# Patient Record
Sex: Female | Born: 1975
Health system: Southern US, Community
[De-identification: ages and names within clinical notes are randomized; demographics above are authoritative.]

## PROBLEM LIST (undated history)

## (undated) DIAGNOSIS — D509 Iron deficiency anemia, unspecified: Principal | ICD-10-CM

## (undated) DIAGNOSIS — D473 Essential (hemorrhagic) thrombocythemia: Secondary | ICD-10-CM

## (undated) DIAGNOSIS — D72829 Elevated white blood cell count, unspecified: Secondary | ICD-10-CM

## (undated) DIAGNOSIS — T7840XA Allergy, unspecified, initial encounter: Secondary | ICD-10-CM

## (undated) DIAGNOSIS — Z923 Personal history of irradiation: Secondary | ICD-10-CM

## (undated) DIAGNOSIS — E119 Type 2 diabetes mellitus without complications: Secondary | ICD-10-CM

## (undated) DIAGNOSIS — T783XXA Angioneurotic edema, initial encounter: Secondary | ICD-10-CM

## (undated) HISTORY — DX: Elevated white blood cell count, unspecified: D72.829

## (undated) HISTORY — PX: WRIST SURGERY: SHX841

## (undated) HISTORY — DX: Angioneurotic edema, initial encounter: T78.3XXA

## (undated) HISTORY — DX: Iron deficiency anemia, unspecified: D50.9

## (undated) HISTORY — PX: CHOLECYSTECTOMY: SHX55

## (undated) HISTORY — PX: APPENDECTOMY: SHX54

## (undated) HISTORY — DX: Allergy, unspecified, initial encounter: T78.40XA

## (undated) HISTORY — DX: Essential (hemorrhagic) thrombocythemia: D47.3

---

## 1997-09-10 ENCOUNTER — Other Ambulatory Visit: Admission: RE | Admit: 1997-09-10 | Discharge: 1997-09-10 | Payer: Self-pay | Admitting: Obstetrics and Gynecology

## 1998-11-12 ENCOUNTER — Other Ambulatory Visit: Admission: RE | Admit: 1998-11-12 | Discharge: 1998-11-12 | Payer: Self-pay | Admitting: Obstetrics and Gynecology

## 1999-01-07 ENCOUNTER — Encounter: Payer: Self-pay | Admitting: Obstetrics and Gynecology

## 1999-01-07 ENCOUNTER — Ambulatory Visit (HOSPITAL_COMMUNITY): Admission: RE | Admit: 1999-01-07 | Discharge: 1999-01-07 | Payer: Self-pay | Admitting: Obstetrics and Gynecology

## 1999-02-05 ENCOUNTER — Encounter: Payer: Self-pay | Admitting: Obstetrics and Gynecology

## 1999-02-05 ENCOUNTER — Ambulatory Visit (HOSPITAL_COMMUNITY): Admission: RE | Admit: 1999-02-05 | Discharge: 1999-02-05 | Payer: Self-pay | Admitting: Obstetrics and Gynecology

## 1999-03-19 ENCOUNTER — Encounter (HOSPITAL_COMMUNITY): Admission: RE | Admit: 1999-03-19 | Discharge: 1999-06-09 | Payer: Self-pay | Admitting: Obstetrics and Gynecology

## 1999-04-03 ENCOUNTER — Ambulatory Visit (HOSPITAL_COMMUNITY): Admission: RE | Admit: 1999-04-03 | Discharge: 1999-04-03 | Payer: Self-pay | Admitting: Obstetrics and Gynecology

## 1999-06-06 ENCOUNTER — Encounter: Payer: Self-pay | Admitting: Obstetrics and Gynecology

## 1999-06-06 ENCOUNTER — Encounter (HOSPITAL_COMMUNITY): Admission: RE | Admit: 1999-06-06 | Discharge: 1999-06-12 | Payer: Self-pay | Admitting: Obstetrics and Gynecology

## 1999-06-10 ENCOUNTER — Encounter: Payer: Self-pay | Admitting: Obstetrics and Gynecology

## 1999-06-10 ENCOUNTER — Inpatient Hospital Stay (HOSPITAL_COMMUNITY): Admission: AD | Admit: 1999-06-10 | Discharge: 1999-06-13 | Payer: Self-pay | Admitting: Obstetrics and Gynecology

## 1999-07-11 ENCOUNTER — Encounter: Admission: RE | Admit: 1999-07-11 | Discharge: 1999-07-29 | Payer: Self-pay | Admitting: Obstetrics and Gynecology

## 1999-08-11 ENCOUNTER — Encounter (HOSPITAL_COMMUNITY): Admission: RE | Admit: 1999-08-11 | Discharge: 1999-11-09 | Payer: Self-pay | Admitting: Obstetrics and Gynecology

## 1999-08-15 ENCOUNTER — Observation Stay (HOSPITAL_COMMUNITY): Admission: EM | Admit: 1999-08-15 | Discharge: 1999-08-16 | Payer: Self-pay | Admitting: Emergency Medicine

## 1999-08-15 ENCOUNTER — Encounter: Payer: Self-pay | Admitting: Emergency Medicine

## 1999-08-15 ENCOUNTER — Encounter: Payer: Self-pay | Admitting: Surgery

## 1999-08-21 ENCOUNTER — Ambulatory Visit (HOSPITAL_COMMUNITY): Admission: AD | Admit: 1999-08-21 | Discharge: 1999-08-21 | Payer: Self-pay | Admitting: Surgery

## 2000-01-09 ENCOUNTER — Other Ambulatory Visit: Admission: RE | Admit: 2000-01-09 | Discharge: 2000-01-09 | Payer: Self-pay | Admitting: Obstetrics and Gynecology

## 2001-01-10 ENCOUNTER — Other Ambulatory Visit: Admission: RE | Admit: 2001-01-10 | Discharge: 2001-01-10 | Payer: Self-pay | Admitting: Obstetrics and Gynecology

## 2001-03-21 ENCOUNTER — Ambulatory Visit (HOSPITAL_COMMUNITY): Admission: RE | Admit: 2001-03-21 | Discharge: 2001-03-21 | Payer: Self-pay | Admitting: Family Medicine

## 2001-03-21 ENCOUNTER — Encounter: Payer: Self-pay | Admitting: Family Medicine

## 2001-09-20 ENCOUNTER — Encounter: Payer: Self-pay | Admitting: Emergency Medicine

## 2001-09-20 ENCOUNTER — Inpatient Hospital Stay (HOSPITAL_COMMUNITY): Admission: EM | Admit: 2001-09-20 | Discharge: 2001-09-22 | Payer: Self-pay | Admitting: Emergency Medicine

## 2001-09-21 ENCOUNTER — Encounter: Payer: Self-pay | Admitting: Orthopedic Surgery

## 2001-11-03 ENCOUNTER — Ambulatory Visit (HOSPITAL_COMMUNITY): Admission: RE | Admit: 2001-11-03 | Discharge: 2001-11-03 | Payer: Self-pay | Admitting: Orthopedic Surgery

## 2001-11-14 ENCOUNTER — Encounter: Admission: RE | Admit: 2001-11-14 | Discharge: 2001-12-13 | Payer: Self-pay | Admitting: Orthopedic Surgery

## 2002-04-19 ENCOUNTER — Other Ambulatory Visit: Admission: RE | Admit: 2002-04-19 | Discharge: 2002-04-19 | Payer: Self-pay | Admitting: Obstetrics and Gynecology

## 2003-06-05 ENCOUNTER — Other Ambulatory Visit: Admission: RE | Admit: 2003-06-05 | Discharge: 2003-06-05 | Payer: Self-pay | Admitting: Obstetrics and Gynecology

## 2004-06-05 ENCOUNTER — Other Ambulatory Visit: Admission: RE | Admit: 2004-06-05 | Discharge: 2004-06-05 | Payer: Self-pay | Admitting: Obstetrics and Gynecology

## 2005-06-08 ENCOUNTER — Other Ambulatory Visit: Admission: RE | Admit: 2005-06-08 | Discharge: 2005-06-08 | Payer: Self-pay | Admitting: Obstetrics and Gynecology

## 2012-05-23 ENCOUNTER — Other Ambulatory Visit: Payer: Self-pay | Admitting: Nurse Practitioner

## 2012-05-23 ENCOUNTER — Ambulatory Visit (HOSPITAL_COMMUNITY)
Admission: RE | Admit: 2012-05-23 | Discharge: 2012-05-23 | Disposition: A | Discharge status: Home / Self Care | Payer: 59 | Source: Ambulatory Visit | Attending: Nurse Practitioner | Admitting: Nurse Practitioner

## 2012-05-23 DIAGNOSIS — R0600 Dyspnea, unspecified: Secondary | ICD-10-CM

## 2012-05-23 DIAGNOSIS — R0609 Other forms of dyspnea: Secondary | ICD-10-CM | POA: Insufficient documentation

## 2012-05-23 DIAGNOSIS — R0989 Other specified symptoms and signs involving the circulatory and respiratory systems: Secondary | ICD-10-CM | POA: Insufficient documentation

## 2012-05-23 DIAGNOSIS — R059 Cough, unspecified: Secondary | ICD-10-CM | POA: Insufficient documentation

## 2012-05-23 IMAGING — CR DG CHEST 2V
2 series · 2 of 2 positions shown · non-contrast
Comparison: None.

CLINICAL DATA: Dyspnea for 2 weeks.  Cough.

CHEST - 2 VIEW

[w chest pa]
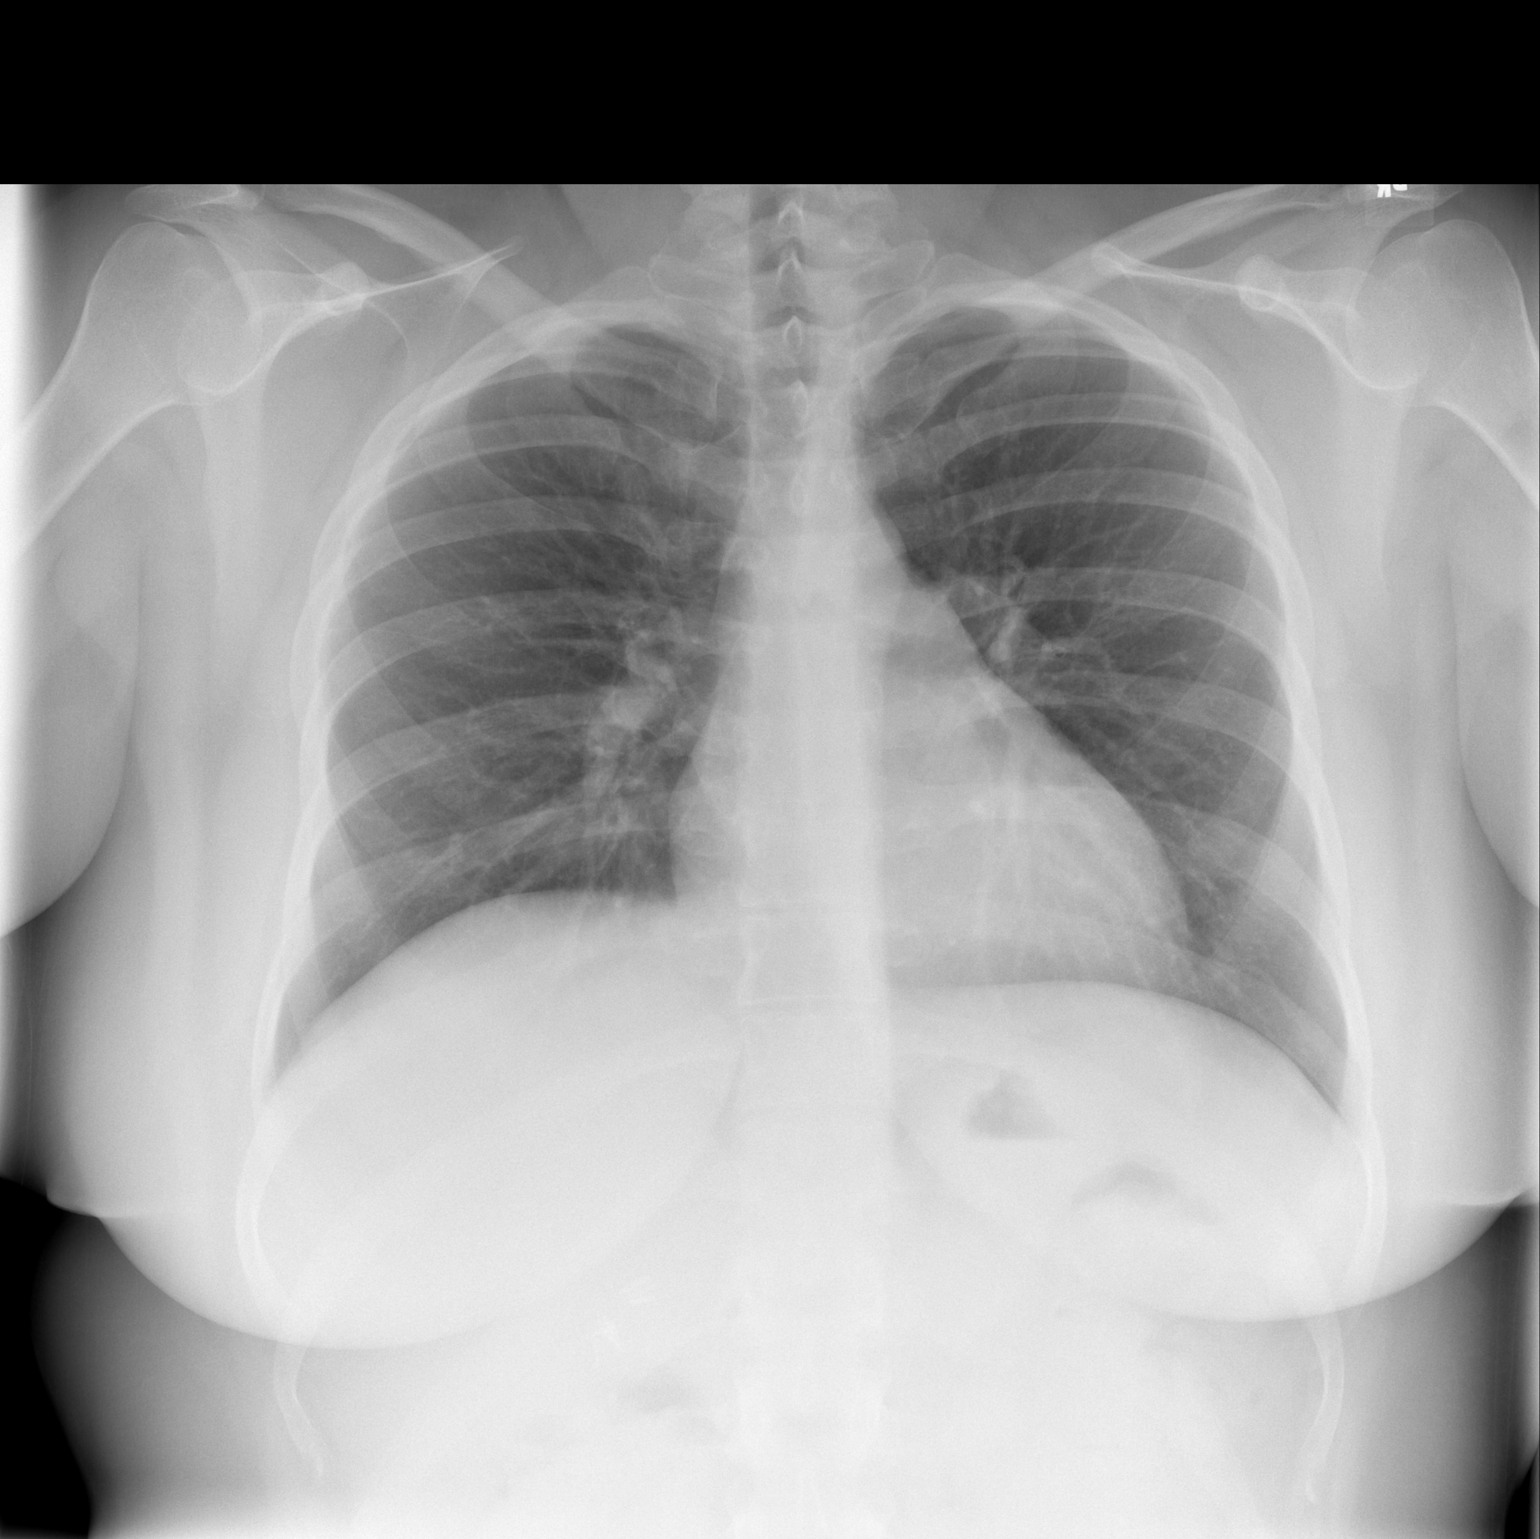

[w chest lat]
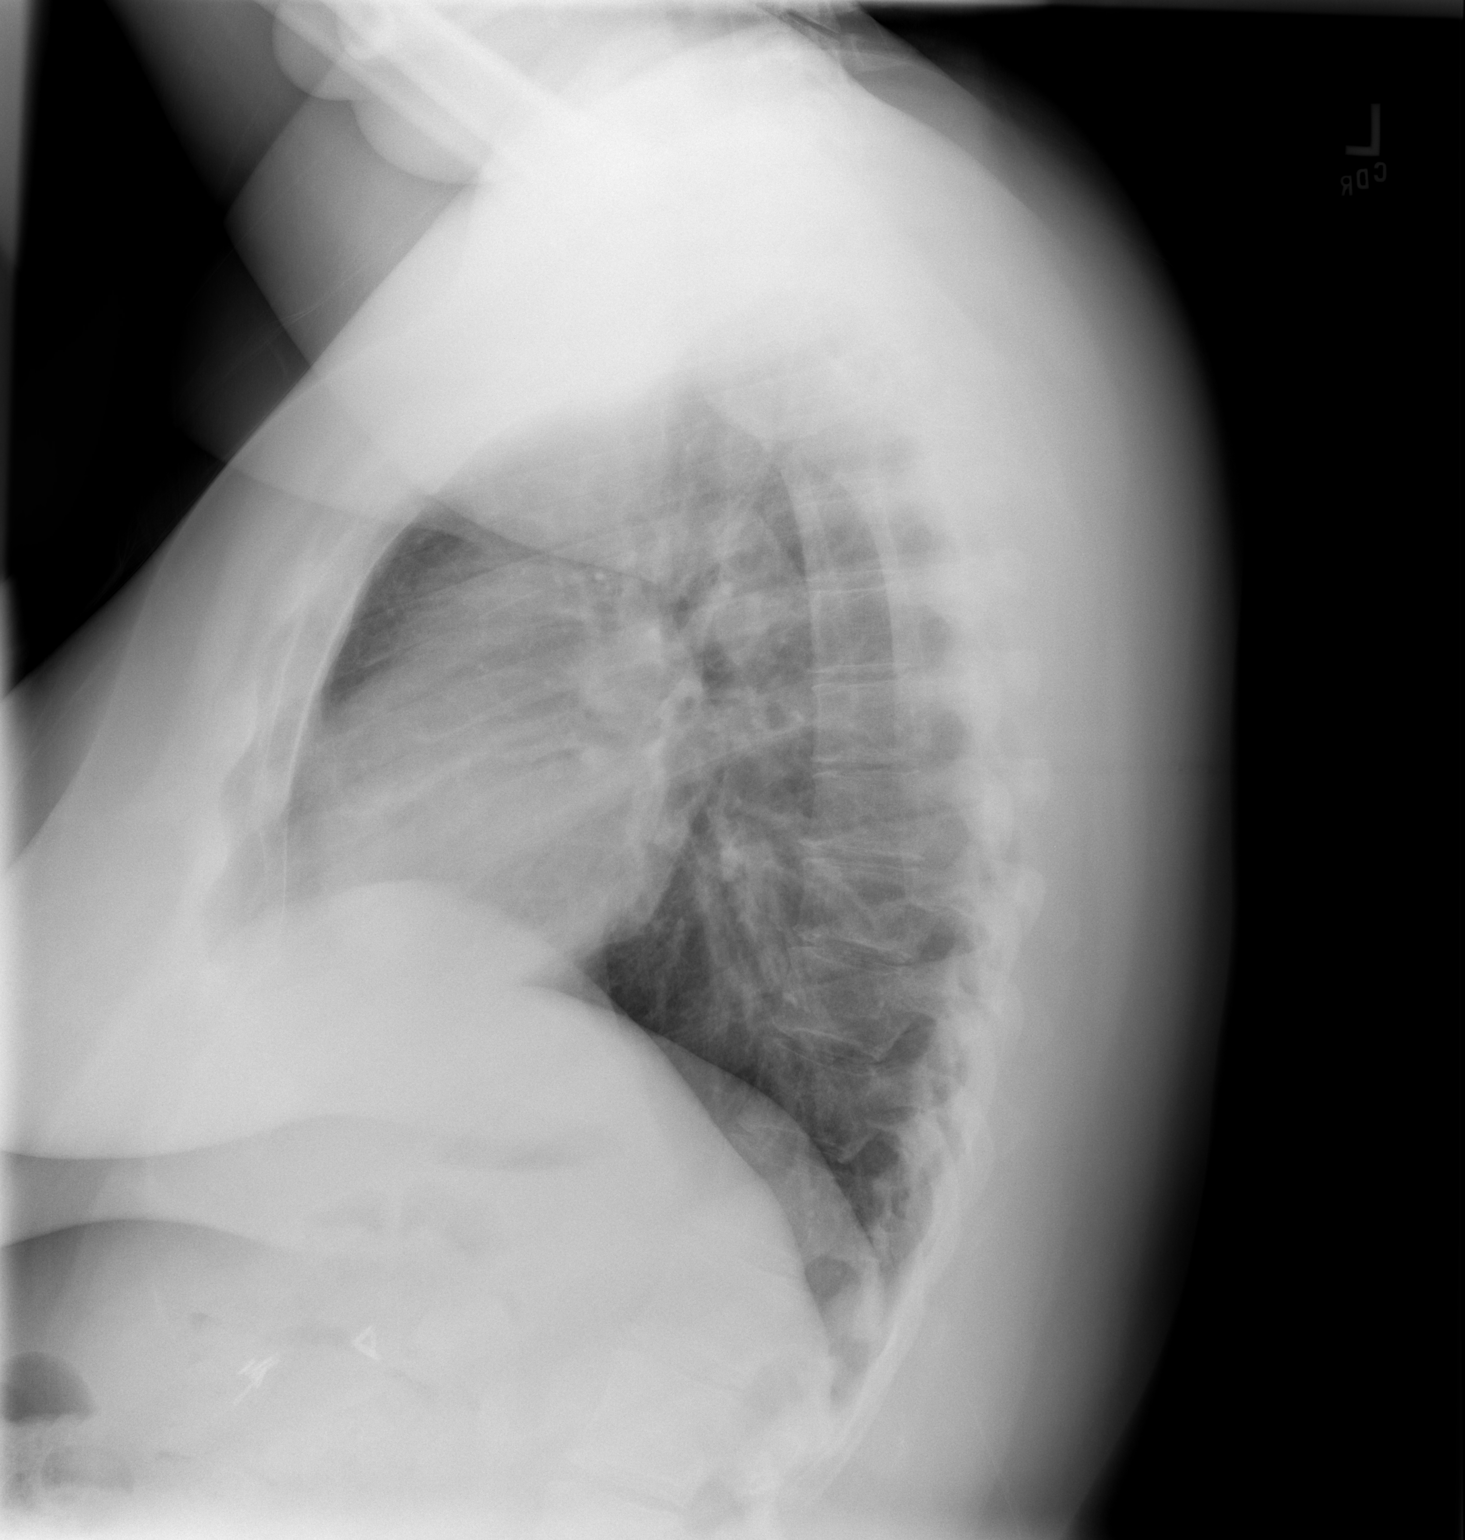

[2 of 2 positions shown; findings below may reference images not displayed]

FINDINGS: The heart size is normal.  The lungs are clear.  The
visualized soft tissues and bony thorax are unremarkable.
IMPRESSION: Negative two-view chest.

## 2012-07-28 ENCOUNTER — Telehealth (HOSPITAL_COMMUNITY): Payer: Self-pay | Admitting: *Deleted

## 2012-07-28 DIAGNOSIS — R609 Edema, unspecified: Secondary | ICD-10-CM

## 2012-07-28 NOTE — Telephone Encounter (Signed)
Per Dr Haroldine Laws pt needs echo for LE edema, order placed and sch

## 2012-08-03 ENCOUNTER — Other Ambulatory Visit (HOSPITAL_COMMUNITY): Payer: Commercial Managed Care - PPO

## 2012-08-04 ENCOUNTER — Ambulatory Visit (HOSPITAL_COMMUNITY): Payer: 59 | Attending: Internal Medicine | Admitting: Radiology

## 2012-08-04 ENCOUNTER — Other Ambulatory Visit (HOSPITAL_COMMUNITY): Payer: Self-pay | Admitting: Internal Medicine

## 2012-08-04 VITALS — BP 119/84 | Ht 64.0 in | Wt 230.0 lb

## 2012-08-04 DIAGNOSIS — R609 Edema, unspecified: Secondary | ICD-10-CM | POA: Insufficient documentation

## 2012-08-04 NOTE — Progress Notes (Signed)
Echocardiogram performed.  

## 2012-08-08 ENCOUNTER — Encounter: Payer: Self-pay | Admitting: Nurse Practitioner

## 2013-01-26 ENCOUNTER — Other Ambulatory Visit: Payer: Self-pay

## 2013-02-15 ENCOUNTER — Encounter: Payer: Self-pay | Admitting: Dietician

## 2013-02-15 ENCOUNTER — Encounter: Payer: 59 | Attending: Physician Assistant | Admitting: Dietician

## 2013-02-15 VITALS — Ht 64.0 in | Wt 225.5 lb

## 2013-02-15 DIAGNOSIS — Z713 Dietary counseling and surveillance: Secondary | ICD-10-CM | POA: Insufficient documentation

## 2013-02-15 DIAGNOSIS — E119 Type 2 diabetes mellitus without complications: Secondary | ICD-10-CM | POA: Insufficient documentation

## 2013-02-15 NOTE — Progress Notes (Signed)
Patient was seen on 02/15/13 for the first of a series of three diabetes self-management courses at the [facility].  Current HbA1c: 6.0   The following learning objectives were met by the patient during this class:  Describe diabetes  State some common risk factors for diabetes  Defines the role of glucose and insulin  Identifies type of diabetes and pathophysiology  Describe the relationship between diabetes and cardiovascular risk  State the members of the Healthcare Team  States the rationale for glucose monitoring  State when to test glucose  State their individual Target Range  State the importance of logging glucose readings  Describe how to interpret glucose readings  Identifies A1C target  Explain the correlation between A1c and eAG values  State symptoms and treatment of high blood glucose  State symptoms and treatment of low blood glucose  Explain proper technique for glucose testing  Identifies proper sharps disposal  Handouts given during class include:  Living Well with Diabetes book  Carb Counting and Meal Planning book  Meal Plan Card  Carbohydrate guide  Meal planning worksheet  Low Sodium Flavoring Tips  The diabetes portion plate  Low Carbohydrate Snack Suggestions  A1c to eAG Conversion Chart  Diabetes Medications  Stress Management  Diabetes Recommended Care Schedule  Diabetes Success Plan  Core Class Satisfaction Survey  Your patient has identified their diabetes care support plan as:  [facility]  Staff   Follow-Up Plan:  Attend core 2

## 2013-02-15 NOTE — Patient Instructions (Signed)
Goals:  Monitor glucose levels as instructed by your doctor  Bring food record and glucose log to your next nutrition visit

## 2013-02-22 ENCOUNTER — Encounter: Payer: 59 | Attending: Physician Assistant | Admitting: Dietician

## 2013-02-22 DIAGNOSIS — E119 Type 2 diabetes mellitus without complications: Secondary | ICD-10-CM | POA: Insufficient documentation

## 2013-02-22 DIAGNOSIS — Z713 Dietary counseling and surveillance: Secondary | ICD-10-CM | POA: Insufficient documentation

## 2013-02-22 NOTE — Patient Instructions (Signed)
Goals:  Follow Diabetes Meal Plan as instructed  Eat 3 meals and 2 snacks, every 3-5 hrs  Limit carbohydrate intake to 30-45 grams carbohydrate/meal  Limit carbohydrate intake to 0-15 grams carbohydrate/snack  Add lean protein foods to meals/snacks  Monitor glucose levels as instructed by your doctor  Bring food record and glucose log to your next nutrition visit

## 2013-02-22 NOTE — Progress Notes (Signed)

## 2013-03-01 DIAGNOSIS — E119 Type 2 diabetes mellitus without complications: Secondary | ICD-10-CM

## 2013-03-01 NOTE — Progress Notes (Signed)
Patient was seen on 03/01/13 for the third of a series of three diabetes self-management courses at the [facility]. The following learning objectives were met by the patient during this class:    State the amount of activity recommended for healthy living   Describe activities suitable for individual needs   Identify ways to regularly incorporate activity into daily life   Identify barriers to activity and ways to over come these barriers  Identify diabetes medications being personally used and their primary action for lowering glucose and possible side effects   Describe role of stress on blood glucose and develop strategies to address psychosocial issues   Identify diabetes complications and ways to prevent them  Explain how to manage diabetes during illness   Evaluate success in meeting personal goal   Establish 2-3 goals that they will plan to diligently work on until they return for the  75-monthfollow-up visit  Goals:  Follow Diabetes Meal Plan as instructed  Aim for 15-30 mins of physical activity daily as tolerated  Bring food record and glucose log to your follow up visit  Your patient has established the following 4 month goals in their individualized success plan:  Count carbohydrates at most meals and snacks  Be active 20 minutes or more 3 times a week  Your patient has identified these potential barriers to change:  Time schedule (works 12 hr shifts)  Your patient has identified their diabetes self-care support plan as  family

## 2013-06-28 ENCOUNTER — Encounter: Payer: 59 | Attending: Family Medicine | Admitting: Dietician

## 2013-06-28 VITALS — Ht 64.0 in | Wt 223.0 lb

## 2013-06-28 DIAGNOSIS — E119 Type 2 diabetes mellitus without complications: Secondary | ICD-10-CM | POA: Insufficient documentation

## 2013-06-28 DIAGNOSIS — R7303 Prediabetes: Secondary | ICD-10-CM

## 2013-06-28 DIAGNOSIS — Z713 Dietary counseling and surveillance: Secondary | ICD-10-CM | POA: Insufficient documentation

## 2013-06-28 NOTE — Progress Notes (Signed)
  Patient was seen on 06/28/2013 for their 4 month follow-up as a part of the diabetes self-management courses at the [facility].   Patient self reports the following:  Nutrition:  Patient has made dietary improvements since class, specifically decreased portions, improved food choices and patient reports that she has decreased her intake of sugar-sweetened beverages significantly (sweet tea and soda).  Wake 2:00/2:30 a.m. Shift starts at 3:00 B 4:00 a.m - Special K cereal with 2% milk, Special K protein bar, Croissant bacon egg and cheese - 350 kcal S (9:00) water and graham crackers and peanut butter L (11:30 ish) - Salads (3 days a week), [place] [employer] sub, [place], water or diet soda D (5:00) - [employer] - Double Cheeseburger and maybe a few fries from son and diet drink S - none, not hungry Bed (9:00/9:30 tries to get to bed  Hungry: not really Full: Has been working on feeling satisfied but not full  Activity: Making a goal of 3 times a week to walk, one mile  A1C 6.3 at [employer] 3 weeks ago post dose of steriods in January No actual diagnosis of Type 2 Diabetes at this time OGTT in March - patient reports that 89 nurse said that she had "borderline diabetes" Will see primary care physician again June 11th  Please see Diabetes Flow sheet for findings related to patient's self-care.  Patient made the following goals at the completion of the Core Class series: Count carbohydrates at most meals and snacks - in progress Be active 20 minutes or more 3 times a week - in progress  We reviewed carbohydrate count in [place] [employer] sub and croissant sandwich. We strategized ways to decrease carbohydrate intake at these meals i.e. Half sub and half salad one day for lunch, half sub and salad for next day  Follow-Up Plan: Patient to call and schedule as needed.

## 2013-11-08 ENCOUNTER — Other Ambulatory Visit: Payer: Self-pay | Admitting: Dermatology

## 2014-01-01 ENCOUNTER — Other Ambulatory Visit: Payer: Self-pay | Admitting: Obstetrics and Gynecology

## 2014-01-01 DIAGNOSIS — R928 Other abnormal and inconclusive findings on diagnostic imaging of breast: Secondary | ICD-10-CM

## 2014-01-10 ENCOUNTER — Other Ambulatory Visit: Payer: 59

## 2014-01-15 ENCOUNTER — Ambulatory Visit
Admission: RE | Admit: 2014-01-15 | Discharge: 2014-01-15 | Disposition: A | Discharge status: Home / Self Care | Payer: 59 | Source: Ambulatory Visit | Attending: Obstetrics and Gynecology | Admitting: Obstetrics and Gynecology

## 2014-01-15 DIAGNOSIS — R928 Other abnormal and inconclusive findings on diagnostic imaging of breast: Secondary | ICD-10-CM

## 2014-01-15 IMAGING — MG MM DIAGNOSTIC UNILATERAL L
3 series · 3 of 3 positions shown · non-contrast
Comparison: With priors.

CLINICAL DATA: Patient was called back from screening mammogram for
possible mass in the left breast.

EXAM:
DIGITAL DIAGNOSTIC  LEFT MAMMOGRAM WITH CAD
ULTRASOUND LEFT BREAST

[L CC]
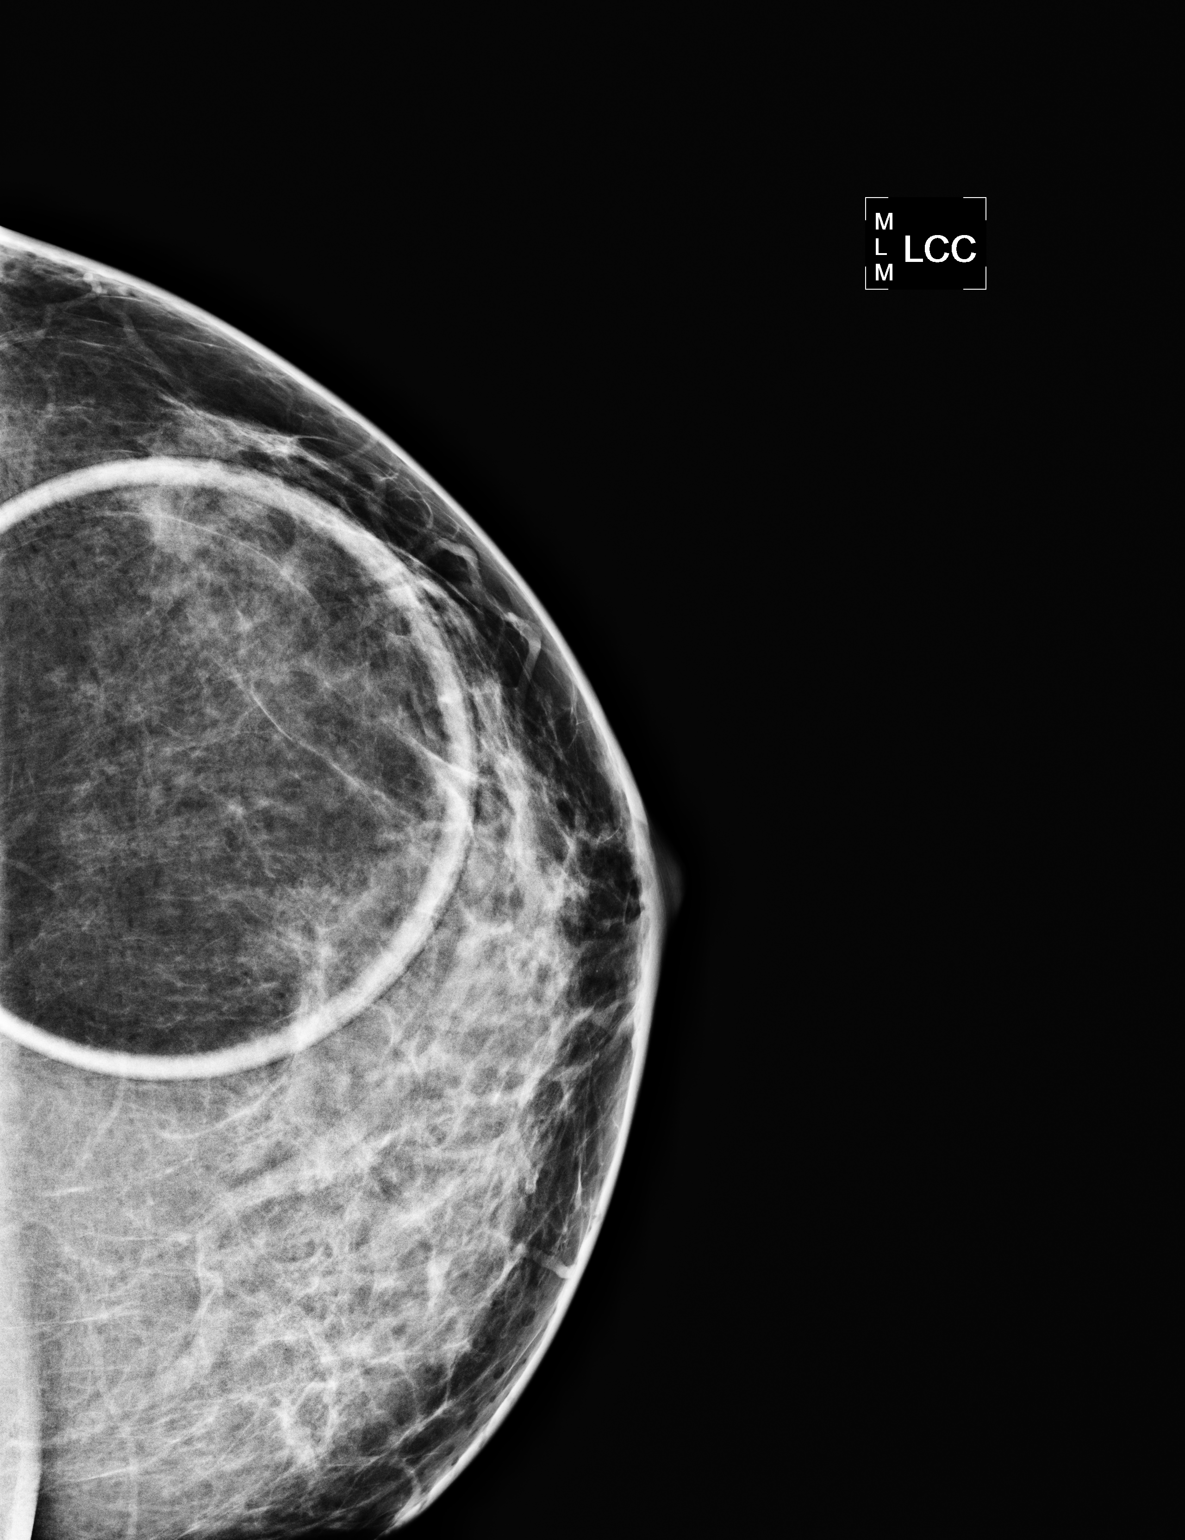

[L MLO]
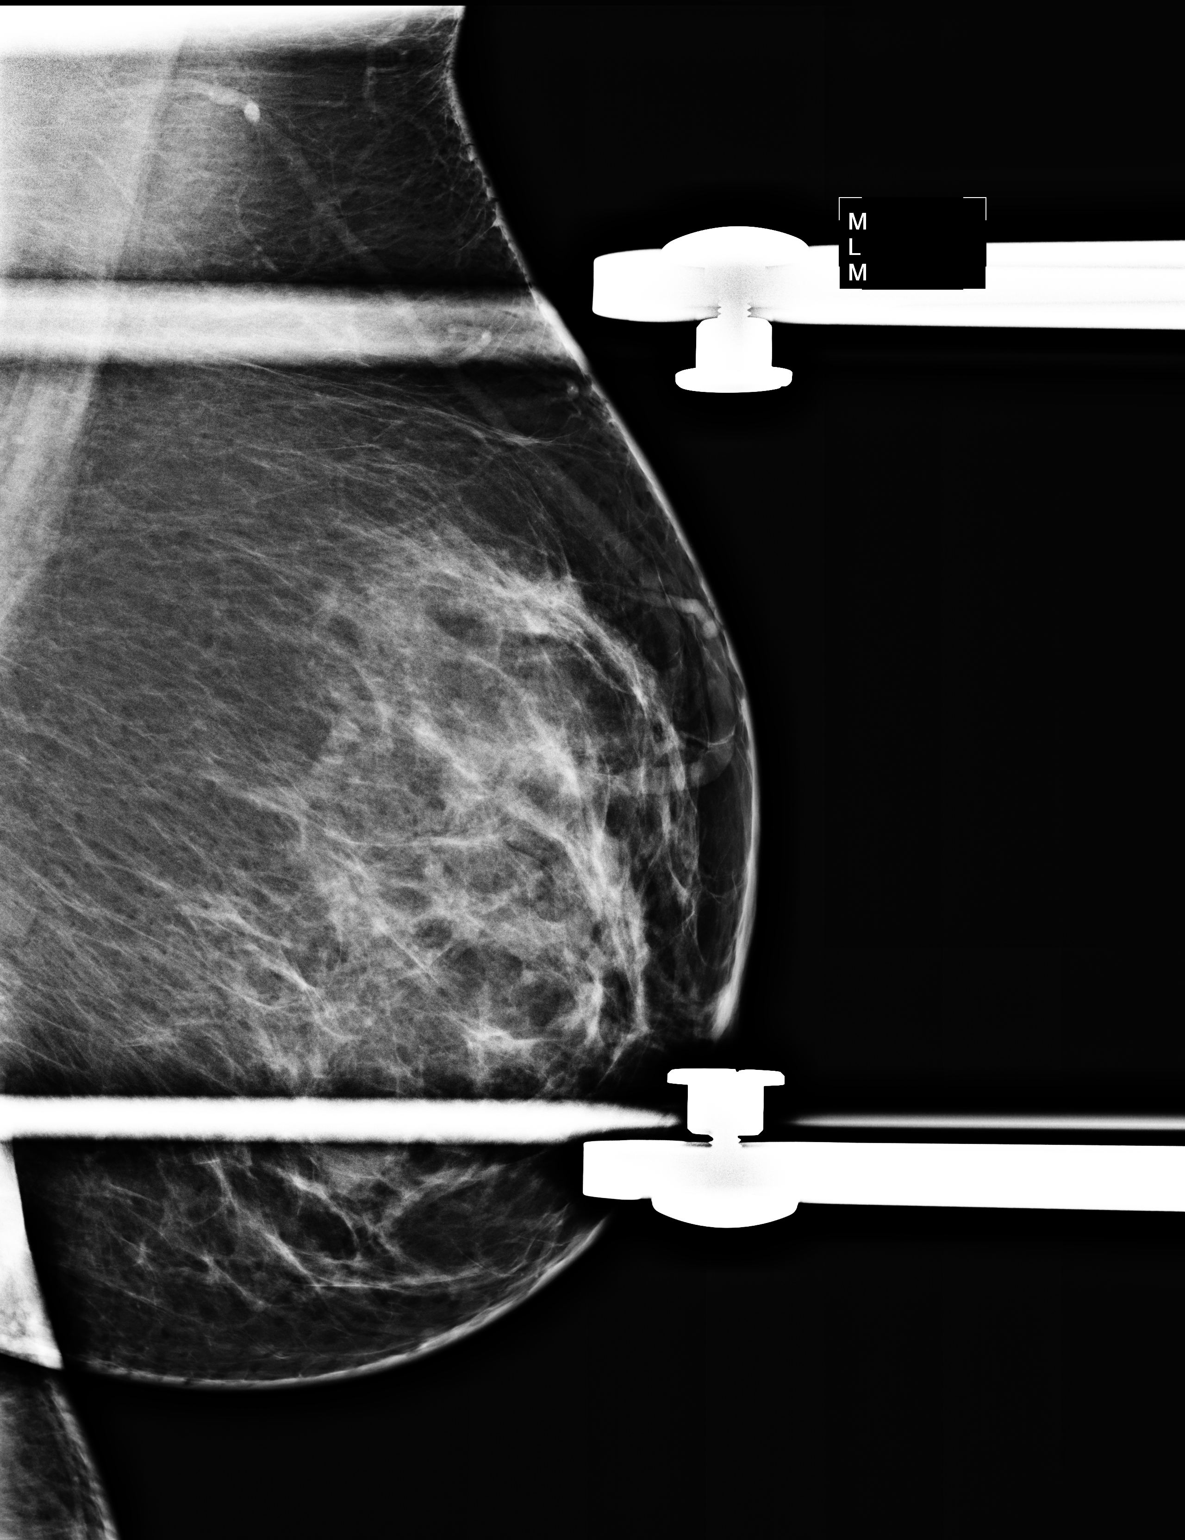

[L ML]
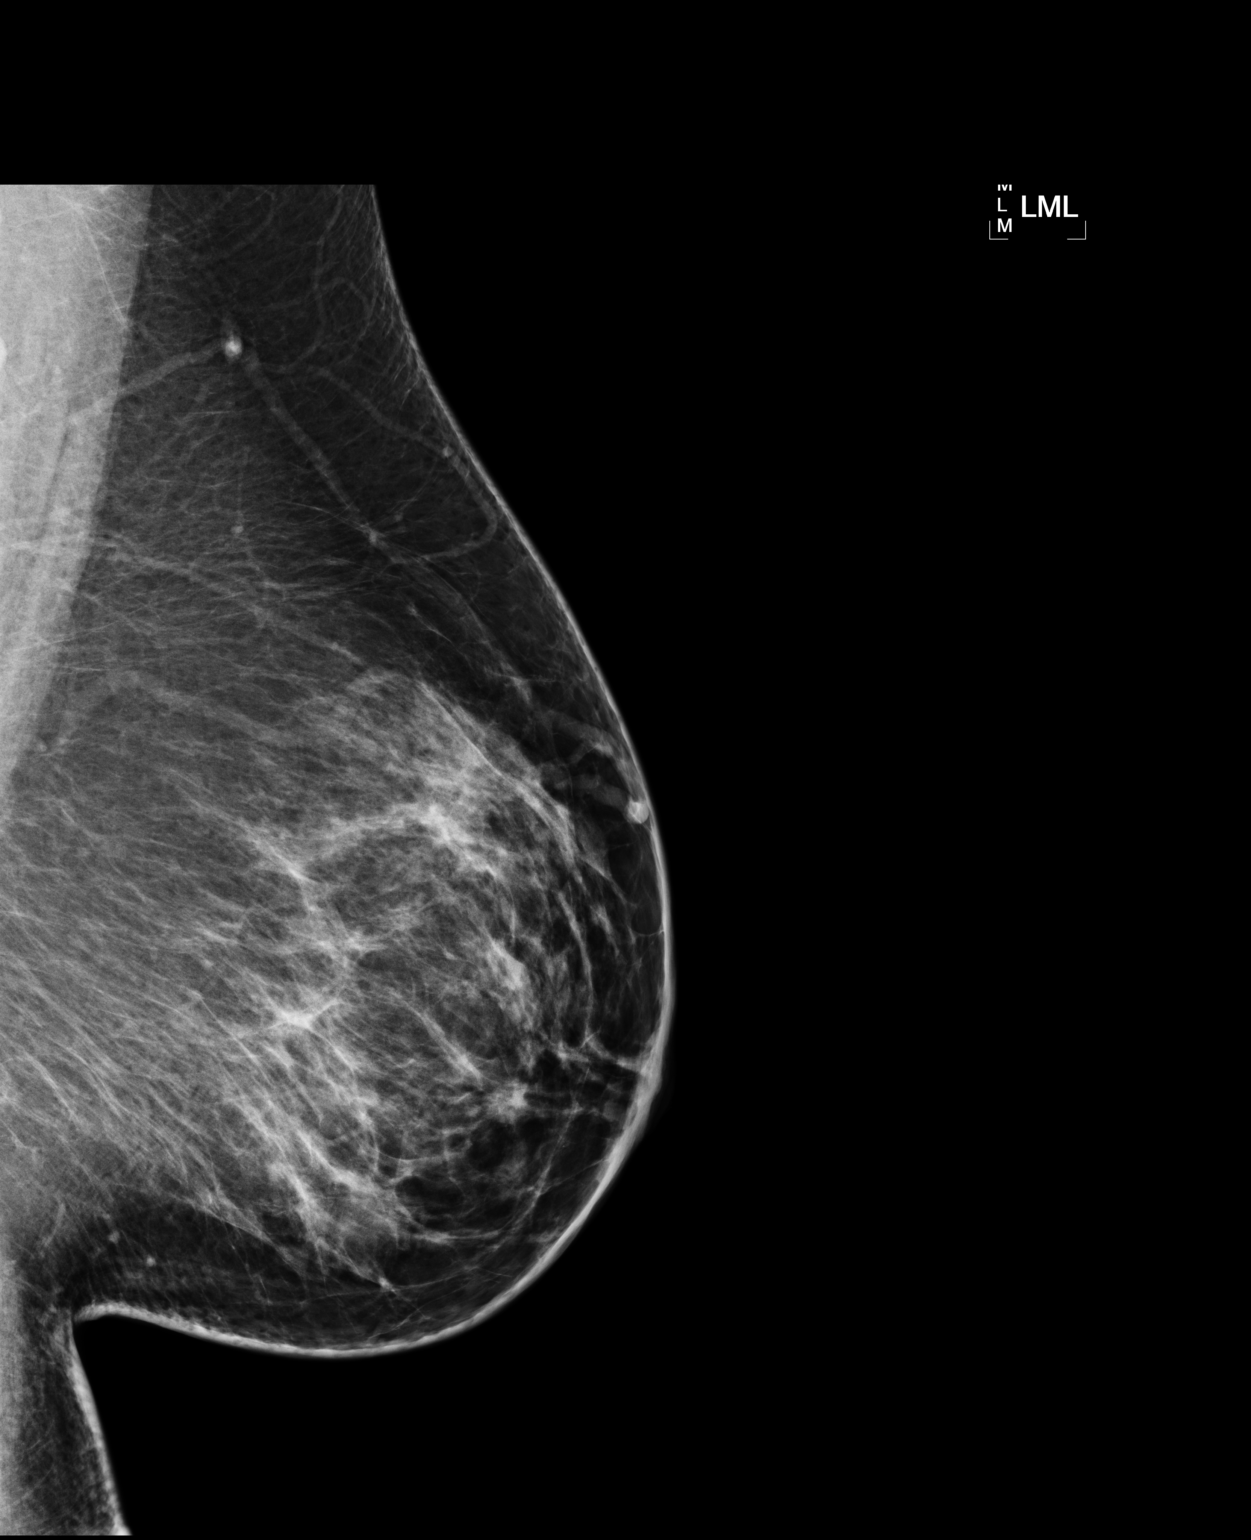

[3 of 3 positions shown; findings below may reference images not displayed]

ACR Breast Density Category b: There are scattered areas of
fibroglandular density.
FINDINGS: Additional imaging of the left breast was performed. No suspicious
mass, malignant type microcalcifications or distortion detected.

Mammographic images were processed with CAD.

On physical exam, I do not palpate a mass in left breast.

Ultrasound is performed, showing normal tissue seen throughout the
lateral aspect of the left breast. No solid or cystic mass, abnormal
shadowing or distortion visualized.
IMPRESSION: No evidence of malignancy in the left breast.

RECOMMENDATION:
Bilateral screening mammogram in 1 year is recommended peer

I have discussed the findings and recommendations with the patient.
Results were also provided in writing at the conclusion of the
visit. If applicable, a reminder letter will be sent to the patient
regarding the next appointment.

BI-RADS CATEGORY  1: Negative.

## 2014-04-24 ENCOUNTER — Telehealth: Payer: Self-pay | Admitting: Internal Medicine

## 2014-04-24 NOTE — Telephone Encounter (Signed)
Spoke with pt confirmed appt. on 04/26/14 at 11:00am DX: leukocytosis Referring Dr. Brigitte Pulse

## 2014-04-25 ENCOUNTER — Telehealth: Payer: Self-pay | Admitting: Internal Medicine

## 2014-04-25 NOTE — Telephone Encounter (Signed)
pt called to cx appt due to work....pt will call back to r/s

## 2014-04-26 ENCOUNTER — Ambulatory Visit: Payer: 59

## 2014-04-26 ENCOUNTER — Other Ambulatory Visit: Payer: 59

## 2014-04-26 ENCOUNTER — Ambulatory Visit: Payer: 59 | Admitting: Internal Medicine

## 2014-05-03 ENCOUNTER — Telehealth: Payer: Self-pay | Admitting: Oncology

## 2014-05-03 NOTE — Telephone Encounter (Signed)
Call to patient to confirm appointment for 05/07/14 at 2:30 lmtcb

## 2014-05-07 ENCOUNTER — Encounter: Payer: 59 | Admitting: Oncology

## 2014-05-08 ENCOUNTER — Encounter: Payer: Self-pay | Admitting: Oncology

## 2014-05-08 ENCOUNTER — Ambulatory Visit (INDEPENDENT_AMBULATORY_CARE_PROVIDER_SITE_OTHER): Payer: 59 | Admitting: Oncology

## 2014-05-08 VITALS — BP 135/90 | HR 81 | Temp 98.6°F | Ht 64.0 in | Wt 227.4 lb

## 2014-05-08 DIAGNOSIS — D72829 Elevated white blood cell count, unspecified: Secondary | ICD-10-CM

## 2014-05-08 DIAGNOSIS — D473 Essential (hemorrhagic) thrombocythemia: Secondary | ICD-10-CM

## 2014-05-08 DIAGNOSIS — D75839 Thrombocytosis, unspecified: Secondary | ICD-10-CM

## 2014-05-08 DIAGNOSIS — D509 Iron deficiency anemia, unspecified: Secondary | ICD-10-CM

## 2014-05-08 DIAGNOSIS — T783XXA Angioneurotic edema, initial encounter: Secondary | ICD-10-CM

## 2014-05-08 HISTORY — DX: Iron deficiency anemia, unspecified: D50.9

## 2014-05-08 HISTORY — DX: Thrombocytosis, unspecified: D75.839

## 2014-05-08 HISTORY — DX: Angioneurotic edema, initial encounter: T78.3XXA

## 2014-05-08 HISTORY — DX: Elevated white blood cell count, unspecified: D72.829

## 2014-05-08 LAB — SAVE SMEAR

## 2014-05-08 NOTE — Patient Instructions (Signed)
Return visit 6 months CBC,diff 1 week before visit

## 2014-05-08 NOTE — Progress Notes (Signed)
Patient ID: Elizabeth Cummings, female   DOB: Aug 28, 1975, 39 y.o.   MRN: [id number] New Patient Hematology   PAMMIE CHIRINO [id number] 01-26-1976 39 y.o. 05/08/2014  CC: Dr. 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 She is always had seasonal allergies and allergies to animals. However this was different. She has taken pictures of some of her flareups and the urticaria is quite impressive. She had one isolated episode where her tongue swelled up coincidentally taking nonsteroidals. She is currently taking when necessary nonsteroidals without any problem and has not had any hypersensitivity to aspirin. She is under the care of a allergist, Dr. Remus Blake, and has been diagnosed with angioneurotic edema. She is taking courses of steroids in the past. She is not currently on any oral or topical steroids. She reports no fevers, night sweats, weight loss, in fact she has had problems losing weight. She has no chronic gum disease. No skin infections. No prosthetic devices. There is no history of any inflammatory bowel disease. No signs or symptoms of a collagen vascular disorder. No history of hepatitis, yellow jaundice, or mononucleosis. No history of polycystic ovary syndrome. Chest radiograph 2 years ago was normal. Mammograms in October 2015 normal. QuantiFERON test for TB negative. She has been on oral contraceptives for over 20 years. There is no family history of any blood disorder in her parents or her 69 year old brother. Laboratory data provided back as far as 11/23/2011 shows a chronic leukocytosis with normal white count differential and a mild thrombocytosis. 11/23/2011: Hemoglobin 11.7, hematocrit 35.9, MCV 77, platelets 456,000, white count  12,500, 69% neutrophils, 25 lymphocytes, 5 monocytes, 1 eosinophil 04/30/2012: White count 14,700, 73 neutrophils, 22 lymphocytes, 4 monocytes, 1 eosinophil 12/07/2012: White count 15,700, 72% neutrophils, 22 lymphocytes, 5 monocytes, 03/07/2014: White count 13,800 04/23/2014: White count 14,800, 64 neutrophils, 29 lymphocytes, 5 monocytes, 1 eosinophil ANA negative. ESR: 42 mm, 04/27/14 at a time when white count was 14,800 and platelets 490,000 Thyroid functions normal, liver functions normal, hemoglobin A1c borderline elevated at 6.7%.  PMH: Past Medical History  Diagnosis Date  . Microcytic anemia 05/08/2014  . Leukocytosis 05/08/2014  . Thrombocytosis 05/08/2014  . Angioneurotic edema 05/08/2014    Past Surgical History  Procedure Laterality Date  . Cesarean section with her first child     . Appendectomy    . Cholecystectomy    . Wrist surgery left wrist following a fracture with external pinning       Allergies: Allergies  Allergen Reactions  . Dilaudid [Hydromorphone Hcl]     Medications:  Current outpatient prescriptions:  .  EPINEPHrine (EPI-PEN) 0.3 mg/0.3 mL SOAJ injection, Inject into the muscle once., Disp: , Rfl:  .  fexofenadine (ALLEGRA) 180 MG tablet, Take 180 mg by mouth daily., Disp: , Rfl:  .  flintstones complete (FLINTSTONES) 60 MG chewable tablet, Chew 1 tablet by mouth daily., Disp: , Rfl:  .  fluocinolone (VANOS) 0.01 % cream, Apply topically 2 (two) times daily., Disp: , Rfl: Has not used in months .  hydrochlorothiazide (HYDRODIURIL) 25 MG tablet, Take 25 mg by mouth daily., Disp: , Rfl:  .  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 She does not have any smokeless tobacco history on file. She reports that she does not drink alcohol or use illicit drugs.  Family History: Family History   Problem Relation Age of Onset  . Hypertension  father and brother    . Hyperlipidemia  father and brother    . Cancer Other   . Stroke  father    . Diabetes  mother      Review of Systems: See HPI She gets an occasional frontal headache relieved with Tylenol. No double vision or blurry vision. No dysphagia. No anorexia. No dyspnea, chest pain, or palpitations. Urge to defecate after she eats since she had her gallbladder removed in the past. No irritable bowel syndrome. No hematochezia or melena. Menstrual cycles have been light recently. She is still on an oral contraceptive. She denies any paresthesias. No excessive hair growth.  Physical Exam: Blood pressure 135/90, pulse 81, temperature 98.6 F (37 C), temperature source Oral, height 5' 4"  (1.626 m), weight 227 lb 6.4 oz (103.148 kg), SpO2 98 %. Wt Readings from Last 3 Encounters:  05/08/14 227 lb 6.4 oz (103.148 kg)  06/28/13 223 lb (101.152 kg)  02/15/13 225 lb 8 oz (102.286 kg)     General appearance: overweight caucasian woman HENNT: Pharynx no erythema, exudate, mass, or ulcer. No thyromegaly or thyroid nodules Lymph nodes: No cervical, supraclavicular, or axillary lymphadenopathy Breasts:  Lungs: Clear to auscultation, resonant to percussion throughout Heart: Regular rhythm, no murmur, no gallop, no rub, no click, no edema Abdomen: Obese, Soft, nontender, normal bowel sounds, no mass, no organomegaly Extremities: No edema, no calf tenderness Musculoskeletal: no joint deformities GU:  Vascular: Carotid pulses 2+, no bruits,  Neurologic: Alert, oriented, PERRLA, optic discs sharp and vessels normal, no hemorrhage or exudate, cranial nerves grossly normal, motor strength 5 over 5, reflexes 1+ symmetric, upper body coordination normal, gait normal, Skin: patchy areas of erythema on neck/bib are, no ecchymosis    Lab Results: No results found for: WBC, HGB, HCT, MCV, PLT   Chemistry   No results found for: NA, K, CL,  CO2, BUN, CREATININE, GLU No results found for: CALCIUM, ALKPHOS, AST, ALT, BILITOT     Review of peripheral blood film: Normochromic normocytic red cells. Neutrophils increased in number but appear mature in lobation and granulation. Mature lymphocytes. Occasional benign reactive lymphocyte. Increased platelets with normal morphology. No immature myeloid or lymphoid cells. No basophils. Rare eosinophil.   Radiological Studies: See discussion above    Impression and Plan: Leukocytosis and thrombocytosis appeared to be reactive to underlying idiopathic immune disorder characterized by chronic urticaria. I have no suspicion that she has a underlying myeloproliferative disorder. No clinical signs or symptoms to suggest an underlying malignancy and she is up-to-date on all of her health maintenance exams. Normal chest x-ray within the last 2 years. Normal mammograms. No change in bowel habit. Other rare causes of leukocytosis/thrombocytosis in  a young woman would include polycystic ovary syndrome were abnormal cytokine production is felt to be related to the elevated counts.  Another rare association with leukocytosis is the metabolic X syndrome which she may fit clinically given her obesity, borderline diabetes, and hyperlipidemia. I might consider checking a cortisol level.  She likely has an element of iron deficiency which may be further elevating her platelet count nonspecifically. I will check iron studies and ferritin today.  I'm sure that her allergist has already  checked, but it would be reasonable to get a serum protein electrophoresis, quantitative immunoglobulins, and immunofixation electrophoresis. I'm going to check a C1 esterase inhibitor level.      Annia Belt, MD 05/08/2014, 7:41 PM

## 2014-05-09 LAB — CBC WITH DIFFERENTIAL/PLATELET
Basophils Absolute: 0 10*3/uL (ref 0.0–0.1)
Basophils Relative: 0 % (ref 0–1)
Eosinophils Absolute: 0.2 10*3/uL (ref 0.0–0.7)
Eosinophils Relative: 2 % (ref 0–5)
HEMATOCRIT: 39.1 % (ref 36.0–46.0)
HEMOGLOBIN: 12.9 g/dL (ref 12.0–15.0)
LYMPHS ABS: 4 10*3/uL (ref 0.7–4.0)
LYMPHS PCT: 33 % (ref 12–46)
MCH: 26.7 pg (ref 26.0–34.0)
MCHC: 33 g/dL (ref 30.0–36.0)
MCV: 80.8 fL (ref 78.0–100.0)
MONO ABS: 0.7 10*3/uL (ref 0.1–1.0)
MONOS PCT: 6 % (ref 3–12)
MPV: 8.7 fL (ref 8.6–12.4)
NEUTROS ABS: 7.1 10*3/uL (ref 1.7–7.7)
Neutrophils Relative %: 59 % (ref 43–77)
Platelets: 529 10*3/uL — ABNORMAL HIGH (ref 150–400)
RBC: 4.84 MIL/uL (ref 3.87–5.11)
RDW: 14.8 % (ref 11.5–15.5)
WBC: 12 10*3/uL — AB (ref 4.0–10.5)

## 2014-05-09 LAB — IRON AND TIBC
%SAT: 11 % — ABNORMAL LOW (ref 20–55)
IRON: 58 ug/dL (ref 42–145)
TIBC: 506 ug/dL — AB (ref 250–470)
UIBC: 448 ug/dL — AB (ref 125–400)

## 2014-05-09 LAB — RETICULOCYTES
ABS RETIC: 72.6 10*3/uL (ref 19.0–186.0)
RBC.: 4.84 MIL/uL (ref 3.87–5.11)
Retic Ct Pct: 1.5 % (ref 0.4–2.3)

## 2014-05-09 LAB — SEDIMENTATION RATE: Sed Rate: 36 mm/hr — ABNORMAL HIGH (ref 0–20)

## 2014-05-09 LAB — FERRITIN: FERRITIN: 36 ng/mL (ref 10–291)

## 2014-05-11 LAB — C1 ESTERASE INHIBITOR, FUNCTIONAL: C1INH Functional/C1INH Total MFr SerPl: >100 % (ref >=68)

## 2014-05-14 ENCOUNTER — Telehealth: Payer: Self-pay | Admitting: *Deleted

## 2014-05-14 NOTE — Telephone Encounter (Signed)
Pt called/informed of labs result - "no mature cells seen on microscope slide of blood. Iron studies low normal. Sed rate mildly elevated@ 36; normal less than 20. Everything still points to non specific Inflammation as reason for elevated white count and platelets" per Dr Beryle Beams. Pt understood. Wanted to know C1 protein result - informed >100.

## 2014-05-14 NOTE — Telephone Encounter (Signed)
-----   Message from Annia Belt, MD sent at 05/10/2014  4:40 PM EST ----- Call pt:  No immature cells seen on microscope slide of blood.  Iron studies low normal.  Sed rate  Mildly elevated at 36 normal less than 20.  Everything still points to non specific inflammation as reason for elevated white count and platelets

## 2014-05-15 ENCOUNTER — Encounter: Payer: 59 | Admitting: Oncology

## 2014-07-10 ENCOUNTER — Ambulatory Visit: Payer: 59

## 2014-08-09 ENCOUNTER — Ambulatory Visit: Payer: 59

## 2014-09-06 ENCOUNTER — Other Ambulatory Visit: Payer: Self-pay

## 2014-09-06 VITALS — BP 136/88 | HR 86 | Ht 64.0 in | Wt 223.8 lb

## 2014-09-06 DIAGNOSIS — R7303 Prediabetes: Secondary | ICD-10-CM

## 2014-09-06 LAB — POCT GLYCOSYLATED HEMOGLOBIN (HGB A1C): HEMOGLOBIN A1C: 6.3

## 2014-09-06 NOTE — Patient Outreach (Signed)
Elizabeth Cummings) Care Management   09/06/2014  Elizabeth Cummings 1975/05/08 [id number]  Elizabeth Cummings is an 39 y.o. female.   Member seen for follow up office visit for [facility] for self management of prediabetes  Subjective: Member states that when she saw her provider in February and her hemoglobin A1C was 6.7.  States that she has been trying to watch her CHO and exercise on the treadmill at work.  States she has not had to be on a steroid in about a year and her hives have been under better control.  States she does continue to eat out frequently and is very busy in the evening with her child's sports activities.  Objective:   Review of Systems  Skin: Positive for itching.  All other systems reviewed and are negative. POC HemoglobinA1C 6.3   Physical Exam  Filed Vitals:   09/06/14 1442  BP: 136/88  Pulse: 86   Filed Weights   09/06/14 1442  Weight: 223 lb 12.8 oz (101.515 kg)    Current Medications:   Current Outpatient Prescriptions  Medication Sig Dispense Refill  . calcium-vitamin D (OSCAL WITH D) 500-200 MG-UNIT per tablet Take 1 tablet by mouth daily.    Marland Kitchen EPINEPHrine (EPI-PEN) 0.3 mg/0.3 mL SOAJ injection Inject into the muscle once.    . fexofenadine (ALLEGRA) 180 MG tablet Take 180 mg by mouth daily.    . flintstones complete (FLINTSTONES) 60 MG chewable tablet Chew 1 tablet by mouth daily.    . fluocinolone (VANOS) 0.01 % cream Apply topically 2 (two) times daily.    . hydrochlorothiazide (HYDRODIURIL) 25 MG tablet Take 25 mg by mouth daily.    Marland Kitchen levocetirizine (XYZAL) 5 MG tablet Take 5 mg by mouth every evening.    . norethindrone-ethinyl estradiol (CYCLAFEM,ALYACEN) 0.5/0.75/1-35 MG-MCG tablet Take 1 tablet by mouth daily.     No current facility-administered medications for this visit.    Functional Status:   In your present state of health, do you have any difficulty performing the following activities: 09/06/2014 05/08/2014   Hearing? N N  Vision? N N  Difficulty concentrating or making decisions? N N  Walking or climbing stairs? N N  Dressing or bathing? N N  Doing errands, shopping? N N  Preparing Food and eating ? N -  Using the Toilet? N -  In the past six months, have you accidently leaked urine? N -  Do you have problems with loss of bowel control? N -  Managing your Medications? N -  Managing your Finances? N -  Housekeeping or managing your Housekeeping? N -    Fall/Depression Screening:    PHQ 2/9 Scores 09/06/2014 05/08/2014  PHQ - 2 Score 0 0   [facility] CM Care Plan Problem One        Patient Outreach from 09/06/2014 in [place] Problem One  Potential for elevated blood sugars    Care Plan for Problem One  Active   [facility] Long Term Goal (31-90 days)  Member will maintain hemoglobin A1C at or below 7 for the next 90 days   [facility] Long Term Goal Start Date  09/06/14   Interventions for Problem One Long Term Goal  Reviewed CHO and portiion sizes, given handout on eating out, Discussed making wise choices when eating  fast food, Reinforced to get regular exercise and its effects on blood sugars, Discussed progressive nature of diabetes and that she might need medication  in the future        Assessment:    Member seen for follow up office visit for [facility] for self management of prediabetes.  Member had increase of her hemoglobin A1C to 6.7 in February.  Reports watching CHO better and exercising 3 times a week since last MD appt.  POC hemoglobin A1C decreased to 6.3.  Member does eat out frequently  Plan:   Plan to check blood sugar once a week, fasting or 1  to 2 hours after eating.  Goals of less than 100 fasting and less than 140 after meals Plan to limit carbohydrates to 30-45 gm per meal and 15 gm for snacks. Plan to walk 3 days a week for 25 minutes. Plan to return to [facility]  on December 06, 2014  at 2:30PM Peter Garter RN, [facility] [place] [facility]  Management Coordinator-[facility] [place] [facility] [phone]

## 2014-09-07 NOTE — Patient Instructions (Signed)
1. Plan to check blood sugar once a week, fasting or 1  to 2 hours after eating.  Goals of less than 100 fasting and less than 140 after meals 2. Plan to limit carbohydrates to 30-45 gm per meal and 15 gm for snacks. 3. Plan to walk 3 days a week for 25 minutes. Plan to return to [facility]  on December 06, 2014  at 2:30PM

## 2014-10-30 ENCOUNTER — Other Ambulatory Visit: Payer: 59

## 2014-11-05 ENCOUNTER — Other Ambulatory Visit (INDEPENDENT_AMBULATORY_CARE_PROVIDER_SITE_OTHER): Payer: 59

## 2014-11-05 DIAGNOSIS — D509 Iron deficiency anemia, unspecified: Secondary | ICD-10-CM

## 2014-11-05 DIAGNOSIS — D473 Essential (hemorrhagic) thrombocythemia: Secondary | ICD-10-CM

## 2014-11-05 DIAGNOSIS — D75839 Thrombocytosis, unspecified: Secondary | ICD-10-CM

## 2014-11-05 DIAGNOSIS — D72829 Elevated white blood cell count, unspecified: Secondary | ICD-10-CM

## 2014-11-06 ENCOUNTER — Ambulatory Visit (INDEPENDENT_AMBULATORY_CARE_PROVIDER_SITE_OTHER): Payer: 59 | Admitting: Oncology

## 2014-11-06 ENCOUNTER — Encounter: Payer: Self-pay | Admitting: Oncology

## 2014-11-06 VITALS — BP 120/59 | HR 87 | Temp 98.1°F | Resp 20 | Ht 63.0 in | Wt 226.3 lb

## 2014-11-06 DIAGNOSIS — D473 Essential (hemorrhagic) thrombocythemia: Secondary | ICD-10-CM | POA: Diagnosis not present

## 2014-11-06 DIAGNOSIS — D75839 Thrombocytosis, unspecified: Secondary | ICD-10-CM

## 2014-11-06 DIAGNOSIS — D72829 Elevated white blood cell count, unspecified: Secondary | ICD-10-CM

## 2014-11-06 DIAGNOSIS — D509 Iron deficiency anemia, unspecified: Secondary | ICD-10-CM | POA: Diagnosis not present

## 2014-11-06 LAB — CBC WITH DIFFERENTIAL/PLATELET
BASOS ABS: 0.1 10*3/uL (ref 0.0–0.2)
Basos: 1 %
EOS (ABSOLUTE): 0.2 10*3/uL (ref 0.0–0.4)
Eos: 2 %
Hematocrit: 36.3 % (ref 34.0–46.6)
Hemoglobin: 11.5 g/dL (ref 11.1–15.9)
Immature Grans (Abs): 0 10*3/uL (ref 0.0–0.1)
Immature Granulocytes: 0 %
LYMPHS ABS: 3.4 10*3/uL — AB (ref 0.7–3.1)
Lymphs: 31 %
MCH: 25.7 pg — ABNORMAL LOW (ref 26.6–33.0)
MCHC: 31.7 g/dL (ref 31.5–35.7)
MCV: 81 fL (ref 79–97)
MONOCYTES: 5 %
Monocytes Absolute: 0.5 10*3/uL (ref 0.1–0.9)
NEUTROS PCT: 61 %
Neutrophils Absolute: 6.7 10*3/uL (ref 1.4–7.0)
Platelets: 463 10*3/uL — ABNORMAL HIGH (ref 150–379)
RBC: 4.48 x10E6/uL (ref 3.77–5.28)
RDW: 16.5 % — ABNORMAL HIGH (ref 12.3–15.4)
WBC: 10.9 10*3/uL — ABNORMAL HIGH (ref 3.4–10.8)

## 2014-11-06 NOTE — Patient Instructions (Signed)
Return as needed

## 2014-11-07 NOTE — Progress Notes (Signed)
Patient ID: Elizabeth Cummings, female   DOB: 1975-07-26, 39 y.o.   MRN: [id number] Hematology and Oncology Follow Up Visit  UGOCHI HENZLER [id number] 03/12/1976 39 y.o. 11/07/2014 4:33 PM   Principle Diagnosis: Encounter Diagnoses  Name Primary?  . Microcytic anemia Yes  . Leukocytosis   . 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 She has a long history of allergies to animals and environmental allergens. However, about 2 years ago, she started to develop recurrent areas of extensive urticaria. One episode was associated with tongue swelling. She was diagnosed with angioneurotic edema by an allergist. She has been treated with intermittent courses of steroids in the past. Please see my initial office consultation note dated 05/08/2014 for additional details of her evaluation. When I saw her in February, 2016, her exam was unremarkable. No lymphadenopathy. No organomegaly. She was up-to-date with all of her health maintenance exams; hemoglobin was 12.9, hematocrit 31, MCV 80.8, white count 12,000 with 59% neutrophils, 33 lymphocytes, 6 monocytes, 2 eosinophils, platelet count 529,000. Review of the peripheral blood film showed no immature cells. Additional laboratory studies included a C1 esterase inhibitor level which was over 100% of control. ESR 36 mm. Reticulocyte count 1.5%. Serum iron 58 with TIBC 506, percent saturation 11, ferritin 36. I felt that her elevated white count and platelets were reactive to her underlying allergic condition with a contribution of nonspecific elevation from iron deficiency.   Interim History:  Her skin remains sensitive but she has not had a severe flare since she saw me 6 months ago. She has had no  interim medical problems.  Repeat lab in anticipation of today's visit done 11/05/2014 showed persistent changes somewhat better than 6 months ago with white count 10,900,  platelet count 463,000.  Medications: reviewed  Allergies:  Allergies  Allergen Reactions  . Dilaudid [Hydromorphone Hcl]     Review of Systems: Dermatological ROS: See above Remaining ROS negative:   Physical Exam: Blood pressure 120/59, pulse 87, temperature 98.1 F (36.7 C), temperature source Oral, resp. rate 20, height 5' 3"  (1.6 m), weight 102.649 kg (226 lb 4.8 oz), last menstrual period 10/23/2014, SpO2 100 %. Wt Readings from Last 3 Encounters:  11/06/14 102.649 kg (226 lb 4.8 oz)  09/06/14 101.515 kg (223 lb 12.8 oz)  05/08/14 103.148 kg (227 lb 6.4 oz)     General appearance:  HENNT: Pharynx no erythema, exudate, mass, or ulcer. No thyromegaly or thyroid nodules Lymph nodes: No cervical, supraclavicular, or axillary lymphadenopathy Breasts: No abnormal skin changes, no dominant mass in either breast Lungs: Clear to auscultation, resonant to percussion throughout Heart: Regular rhythm, no murmur, no gallop, no rub, no click, no edema Abdomen: Soft, nontender, normal bowel sounds, no mass, no organomegaly Extremities: No edema, no calf tenderness Musculoskeletal: no joint deformities GU:  Vascular: Carotid pulses 2+, no bruits, distal pulses: Dorsalis pedis 1+ symmetric Neurologic: Alert, oriented, PERRLA, optic discs sharp and vessels normal, no hemorrhage or exudate, cranial nerves grossly normal, motor strength 5 over 5, reflexes 1+ symmetric, upper body coordination normal, gait normal, Skin: No rash or ecchymosis  Lab Results: CBC W/Diff    Component Value Date/Time   WBC 10.9* 11/05/2014 0920   WBC 12.0* 05/08/2014 1600   RBC 4.48 11/05/2014 0920   RBC 4.84 05/08/2014 1600   RBC 4.84 05/08/2014 1600   HGB 12.9 05/08/2014 1600   HCT 36.3 11/05/2014 0920   HCT 39.1 05/08/2014  1600   PLT 529* 05/08/2014 1600   MCV 80.8 05/08/2014 1600   MCH 25.7* 11/05/2014 0920   MCH 26.7 05/08/2014 1600   MCHC 31.7 11/05/2014 0920   MCHC 33.0 05/08/2014 1600   RDW  16.5* 11/05/2014 0920   RDW 14.8 05/08/2014 1600   LYMPHSABS 3.4* 11/05/2014 0920   LYMPHSABS 4.0 05/08/2014 1600   MONOABS 0.7 05/08/2014 1600   EOSABS 0.2 05/08/2014 1600   BASOSABS 0.1 11/05/2014 0920   BASOSABS 0.0 05/08/2014 1600     Chemistry   No results found for: NA, K, CL, CO2, BUN, CREATININE, GLU No results found for: CALCIUM, ALKPHOS, AST, ALT, BILITOT     Radiological Studies: No results found.  Impression:  #1. Reactive leukocytosis and thrombocytosis related to underlying allergic condition. No suspicion that she has a myeloproliferative disorder. Recommend annual CBC by her primary care physician. I will see her again if there are any significant changes.  #2. Iron deficiency anemia. She has not really been taking much more than a Flintstone multivitamin. She is advised to take iron sulfate at least 325 mg daily.   CC: Patient Care Team: Mayra Neer, MD as PCP - General (Family Medicine) Annia Belt, MD as Consulting Physician (Oncology) Jolaine Artist, MD as Consulting Physician (Cardiology) Tiajuana Amass, MD as Referring Physician (Allergy and Immunology) Dimitri Ped, RN as [facility]   Annia Belt, MD 8/17/20164:33 PM

## 2014-12-06 ENCOUNTER — Ambulatory Visit: Payer: 59

## 2014-12-27 ENCOUNTER — Other Ambulatory Visit: Payer: Self-pay

## 2014-12-27 VITALS — BP 120/86 | HR 93 | Resp 14 | Ht 64.0 in | Wt 225.0 lb

## 2014-12-27 DIAGNOSIS — R7303 Prediabetes: Secondary | ICD-10-CM

## 2014-12-27 LAB — POCT GLYCOSYLATED HEMOGLOBIN (HGB A1C): HEMOGLOBIN A1C: 6.4

## 2014-12-27 NOTE — Patient Outreach (Signed)
[facility]) Care Management   12/27/2014  SHIVONNE SCHWARTZMAN 12-Jul-1975 [id number]  Elizabeth Cummings is an 39 y.o. female.   Member seen for follow up office visit for Link to Wellness program for self management of  prediabetes  Subjective: Member states that she saw Dr.Shaw in August and her hemoglobin A1C was 6.7.  States she had just finished a round of steroids for her tendonitis in her lt ankle.  States she has been exercising in the gym at [facility] 3 times a week for about 25 minutes.  States she continues to eat out frequently due to son's after school sports but she has tried to choose chicken sometimes.  Objective:   Review of Systems  All other systems reviewed and are negative.   Physical Exam  Today's Vitals   12/27/14 1411  BP: 120/86  Pulse: 93  Resp: 14  Height: 1.626 m (5' 4" )  Weight: 225 lb (102.059 kg)  SpO2: 97%  PainSc: 0-No pain  POC Hemoglobin A1C- 6.4 Current Medications:   Current Outpatient Prescriptions  Medication Sig Dispense Refill  . calcium-vitamin D (OSCAL WITH D) 500-200 MG-UNIT per tablet Take 1 tablet by mouth daily.    . diclofenac (VOLTAREN) 75 MG EC tablet Take 75 mg by mouth 2 (two) times daily.    Marland Kitchen EPINEPHrine (EPI-PEN) 0.3 mg/0.3 mL SOAJ injection Inject into the muscle once.    . fexofenadine (ALLEGRA) 180 MG tablet Take 180 mg by mouth daily.    . flintstones complete (FLINTSTONES) 60 MG chewable tablet Chew 1 tablet by mouth daily.    . fluocinolone (VANOS) 0.01 % cream Apply topically 2 (two) times daily.    . hydrochlorothiazide (HYDRODIURIL) 25 MG tablet Take 25 mg by mouth daily.    Marland Kitchen levocetirizine (XYZAL) 5 MG tablet Take 5 mg by mouth every evening.    . norethindrone-ethinyl estradiol (CYCLAFEM,ALYACEN) 0.5/0.75/1-35 MG-MCG tablet Take 1 tablet by mouth daily.     No current facility-administered medications for this visit.    Functional Status:   In your present state of health, do you have any difficulty  performing the following activities: 12/27/2014 11/06/2014  Hearing? N N  Vision? N N  Difficulty concentrating or making decisions? N N  Walking or climbing stairs? N N  Dressing or bathing? N N  Doing errands, shopping? - Scientist, forensic and eating ? - -  Using the Toilet? - -  In the past six months, have you accidently leaked urine? - -  Do you have problems with loss of bowel control? - -  Managing your Medications? - -  Managing your Finances? - -  Housekeeping or managing your Housekeeping? - -    Fall/Depression Screening:    PHQ 2/9 Scores 12/27/2014 11/06/2014 09/06/2014 05/08/2014  PHQ - 2 Score 0 0 0 0    Assessment:   Member seen for follow up office visit for Link to Wellness program for self management of  Prediabetes.  Member has decreased hemoglobin A1C from 6.7 to 6.4.  Member reports exercising regularly now.  She continues to eat out frequently and has difficulty with portion control.  She is open to joining [employer] to help with weight loss.  Plan:  Plan to check blood sugar once a week, fasting or 1  to 2 hours after eating.  Goals of less than 100 fasting and less than 140 after meals Plan to limit carbohydrates to 30-45 gm per meal and 15 gm  for snacks. Plan to walk or ride bike 3 days a week for 25 minutes. Plan to do weight resistance exercises twice a week for 10 minutes Consider joining [employer] Plan to keep eye appointment on 01/22/15 Plan to return to Link to Wellness  on March 28, 2015  at 2:00PM  [facility] CM Care Plan Problem One        Most Recent Value   Care Plan Problem One  Potential for elevated blood sugars    Role Documenting the Problem One  Care Management [place] for Problem One  Active   [facility] Long Term Goal (31-90 days)  Member will maintain hemoglobin A1C at or below 7 for the next 90 days   [facility]N Long Term Goal Start Date  12/27/14 [Maintained Hemoglobin A1C of 6.4]   Interventions for Problem One Long Term Goal   Reviewed CHO and portiion sizes, Given handout on how to sign up for Weight [employer] with the [facility] discount, Instructed that losing weight can help with her blood sugars and insulin resistance, Discussed making wise choices when eating  fast food, Reinforced to get regular exercise and its effects on blood sugars, Reviewed blood sugar goals       Peter Garter RN, [facility] Care Management Coordinator-Link to [place] Management [phone]

## 2014-12-27 NOTE — Patient Instructions (Signed)
1. Plan to check blood sugar once a week, fasting or 1  to 2 hours after eating.  Goals of less than 100 fasting and less than 140 after meals 2. Plan to limit carbohydrates to 30-45 gm per meal and 15 gm for snacks. 3. Plan to walk or ride bike 3 days a week for 25 minutes. Plan to do weight resistance exercises twice a week for 10 minutes 4. Consider joining [employer] 5. Plan to keep eye appointment on 01/22/15 6. Plan to return to Link to Wellness  on March 28, 2015  at 2:00PM

## 2015-01-03 ENCOUNTER — Ambulatory Visit: Payer: 59

## 2015-02-18 ENCOUNTER — Ambulatory Visit: Payer: 59 | Attending: Podiatry | Admitting: Physical Therapy

## 2015-02-18 DIAGNOSIS — R29898 Other symptoms and signs involving the musculoskeletal system: Secondary | ICD-10-CM | POA: Diagnosis not present

## 2015-02-18 DIAGNOSIS — M7660 Achilles tendinitis, unspecified leg: Secondary | ICD-10-CM

## 2015-02-18 DIAGNOSIS — M25673 Stiffness of unspecified ankle, not elsewhere classified: Secondary | ICD-10-CM

## 2015-02-18 DIAGNOSIS — M67879 Other specified disorders of synovium and tendon, unspecified ankle and foot: Secondary | ICD-10-CM | POA: Diagnosis present

## 2015-02-18 DIAGNOSIS — R269 Unspecified abnormalities of gait and mobility: Secondary | ICD-10-CM | POA: Diagnosis present

## 2015-02-18 NOTE — Therapy (Signed)
[place], [place], [address] Phone: [phone]   Fax:  [phone]  Physical Therapy Evaluation  Patient Details  Name: Elizabeth Cummings MRN: [id number] Date of Birth: 06-Dec-1975 Referring Provider: Steffanie Rainwater, DPM  Encounter Date: 02/18/2015      PT End of Session - 02/18/15 1808    Visit Number 1   Number of Visits 12   Date for PT Re-Evaluation 03/20/15   PT Start Time 1504   PT Stop Time 1545   PT Time Calculation (min) 41 min   Activity Tolerance Patient tolerated treatment well   Behavior During Therapy [facility] for tasks assessed/performed      Past Medical History  Diagnosis Date  . Microcytic anemia 05/08/2014  . Leukocytosis 05/08/2014  . Thrombocytosis ([place]) 05/08/2014  . 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 Allergy     seasonal, dogs, cats    Past Surgical History  Procedure Laterality Date  . Cesarean section    . Appendectomy    . Cholecystectomy    . Wrist surgery      There were no vitals filed for this visit.  Visit Diagnosis:  Decreased range of motion of ankle  Achilles tendon pain  Gait abnormality      Subjective Assessment - 02/18/15 1805    Subjective Pain started for unknown reason in October. Recalls long history of occasional tenderness in this area but would always go away.    How long can you sit comfortably? no problems   How long can you stand comfortably? able stand throughout the day   How long can you walk comfortably? no restrictions   Patient Stated Goals Be able to get back to walking without pain.    Pain Onset More than a month ago            [facility] PT Assessment - 02/18/15 0001    Assessment   Medical Diagnosis achilles tendonitis   Referring Provider Steffanie Rainwater, DPM   Onset Date/Surgical Date --  Mid October   Next MD Visit none scheduled   Prior Therapy none   Precautions   Precautions None   Restrictions   Weight Bearing Restrictions  No   Balance Screen   Has the patient fallen in the past 6 months No   Prior Function   Level of Independence Independent   Vocation Full time employment   Cognition   Overall Cognitive Status Within Functional Limits for tasks assessed   Observation/Other Assessments   Observations no deviations in arch height or rear foot position, posture grossly WFL   Sensation   Light Touch Appears Intact   AROM   AROM Assessment Site Ankle   Right/Left Ankle Left   Right Ankle Dorsiflexion 12   Left Ankle Dorsiflexion 7  pain   Left Ankle Plantar Flexion 40   Left Ankle Inversion --  WFL   Left Ankle Eversion --  [facility]   Strength   Strength Assessment Site Ankle   Right/Left Ankle Right;Left   Right Ankle Dorsiflexion 5/5   Right Ankle Plantar Flexion 5/5   Right Ankle Inversion 5/5   Right Ankle Eversion 5/5   Left Ankle Dorsiflexion 4/5  pain in achilles   Left Ankle Plantar Flexion 4-/5  pain in achilles   Left Ankle Inversion 5/5   Left Ankle Eversion 5/5  pain at achilles   Ambulation/Gait   Gait Pattern --  decreased pushoff on Lt  [facility] Adult PT Treatment/Exercise - 02/18/15 0001    Ankle Exercises: Stretches   Gastroc Stretch 5 reps;20 seconds;Other (comment)  UE assisted df stretch with sheet.                Short term goals are same as long term goals.      PT Long Term Goals - 02/18/15 1817    PT LONG TERM GOAL #1   Title Patient to demonstrate 15 degrees of Lt ankle dorsiflexion for walking up inclines.   Time 4   Period Weeks   Status New   PT LONG TERM GOAL #2   Title Patient to be independent with HEP for stretching and stabilization through the LLE.   Time 4   Period Weeks   Status New   PT LONG TERM GOAL #3   Title Patient to report her pain as less than or equal to 2/10 with activity   Time 4   Period Weeks   Status New               Plan - 02/18/15 1811    Clinical Impression Statement Patient  presenting with ongoing pain in the Rt achilles region which impacts her standing and walking. She states that she is still able to work but has continuing pain especially when getting up and walking after sitting. The patient presents with significant decreased ankle dorsiflexion on the left. Overall the patient is appropriate for continued PT sessions.    Pt will benefit from skilled therapeutic intervention in order to improve on the following deficits Abnormal gait;Decreased activity tolerance;Impaired flexibility;Decreased range of motion;Decreased mobility;Difficulty walking;Pain   PT Frequency 2x / week   PT Duration 4 weeks   PT Treatment/Interventions ADLs/Self Care Home Management;Electrical Stimulation;Iontophoresis 67m/ml Dexamethasone;Moist Heat;Therapeutic exercise;Therapeutic activities;Functional mobility training;Gait training;Ultrasound;Patient/family education   PT Next Visit Plan Add modalities as appropriate and manual therapy techniques to address pain and decreased ankle dorsiflexion.   PT Home Exercise Plan Standing dorsiflexion stretch, trial foam roller as tol.   Consulted and Agree with Plan of Care Patient         Problem List Patient Active Problem List   Diagnosis Date Noted  . Microcytic anemia 05/08/2014  . Leukocytosis 05/08/2014  . Thrombocytosis ([place] 05/08/2014  . Angioneurotic edema 05/08/2014    BLinard Millers PT, CSCS 02/18/2015, 6:25 PM  [facility]GWake [address] [place] [address]Phone: [phone]  Fax:  [phone] Name: Elizabeth LADUKEMRN: [id number] of Birth: 615-Jan-1977

## 2015-02-20 ENCOUNTER — Ambulatory Visit: Payer: 59 | Admitting: Physical Therapy

## 2015-02-20 DIAGNOSIS — R269 Unspecified abnormalities of gait and mobility: Secondary | ICD-10-CM

## 2015-02-20 DIAGNOSIS — M25673 Stiffness of unspecified ankle, not elsewhere classified: Secondary | ICD-10-CM

## 2015-02-20 DIAGNOSIS — R29898 Other symptoms and signs involving the musculoskeletal system: Secondary | ICD-10-CM | POA: Diagnosis not present

## 2015-02-20 DIAGNOSIS — M7660 Achilles tendinitis, unspecified leg: Secondary | ICD-10-CM

## 2015-02-20 NOTE — Therapy (Signed)
[place], [place], [address] Phone: [phone]   Fax:  [phone]  Physical Therapy Treatment  Patient Details  Name: Elizabeth Cummings MRN: [id number] Date of Birth: [birth year] Referring Provider: Steffanie Rainwater, DPM  Encounter Date: 02/20/2015      PT End of Session - 02/20/15 1159    Visit Number 2   Number of Visits 12   Date for PT Re-Evaluation 03/20/15   PT Start Time 1020   PT Stop Time 1105   PT Time Calculation (min) 45 min   Activity Tolerance Patient tolerated treatment well;No increased pain   Behavior During Therapy [facility] for tasks assessed/performed      Past Medical History  Diagnosis Date  . Microcytic anemia 05/08/2014  . Leukocytosis 05/08/2014  . Thrombocytosis ([place]) 05/08/2014  . 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 Allergy     seasonal, dogs, cats    Past Surgical History  Procedure Laterality Date  . Cesarean section    . Appendectomy    . Cholecystectomy    . Wrist surgery      There were no vitals filed for this visit.  Visit Diagnosis:  Decreased range of motion of ankle  Achilles tendon pain  Gait abnormality      Subjective Assessment - 02/20/15 1130    Subjective Pain less with the stretching.  1-2/10 now.   Currently in Pain? Yes   Pain Score 2    Pain Location Calf   Pain Orientation Left   Pain Descriptors / Indicators --  tight                         [facility] Adult PT Treatment/Exercise - 02/20/15 1020    Ultrasound   Ultrasound Location Lt lower leg, achilles area, 100% , 8 minutes on older pre set machine mHz   Ultrasound Goals --  pain, flexibility   Manual Therapy   Manual Therapy --  kinesiotex taping to inhibit gastroc, distal to proximal 10%   Manual therapy comments retrograde soft tissue work starting at groin lymphnodes.  instrument assist intermittantly.    great toe mobilization with movement to increase extension,    Ankle Exercises: Stretches   Gastroc Stretch --  3 minutes with strap post [place]                PT Education - 02/20/15 1158    Education provided Yes   Education Details retrograde techniques   Person(s) Educated Patient   Methods Explanation;Demonstration;Tactile cues;Verbal cues   Comprehension Verbalized understanding             PT Long Term Goals - 02/18/15 1817    PT LONG TERM GOAL #1   Title Patient to demonstrate 15 degrees of Lt ankle dorsiflexion for walking up inclines.   Time 4   Period Weeks   Status New   PT LONG TERM GOAL #2   Title Patient to be independent with HEP for stretching and stabilization through the LLE.   Time 4   Period Weeks   Status New   PT LONG TERM GOAL #3   Title Patient to report her pain as less than or equal to 2/10 with activity   Time 4   Period Weeks   Status New               Plan - 02/20/15 1159    Clinical Impression Statement No pulling post session with  walking.     PT Next Visit Plan stretching gastroc, soleus standing if able, hamstring stretch toe and foot mobilization to decerase medial foot stiffness.   Ionto if MD signature and Patien wants.  Md did not want to do steriod injection due to weakening tendon .   PT Home Exercise Plan continue previous home exercises.   Consulted and Agree with Plan of Care Patient        Problem List Patient Active Problem List   Diagnosis Date Noted  . Microcytic anemia 05/08/2014  . Leukocytosis 05/08/2014  . Thrombocytosis ([place]) 05/08/2014  . Angioneurotic edema 05/08/2014    [facility] 02/20/2015, 12:05 PM  [facility] [address] [place], [place], [address] Phone: [phone]   Fax:  [phone]  Name: KHALAYA MCGURN MRN: [id number] Date of Birth: 1976/01/18    Melvenia Needles, PTA 02/20/2015 12:05 PM Phone: [phone] Fax: [phone]

## 2015-02-25 ENCOUNTER — Ambulatory Visit: Payer: 59 | Attending: Podiatry | Admitting: Physical Therapy

## 2015-02-25 DIAGNOSIS — R269 Unspecified abnormalities of gait and mobility: Secondary | ICD-10-CM | POA: Diagnosis present

## 2015-02-25 DIAGNOSIS — R29898 Other symptoms and signs involving the musculoskeletal system: Secondary | ICD-10-CM | POA: Diagnosis not present

## 2015-02-25 DIAGNOSIS — M67879 Other specified disorders of synovium and tendon, unspecified ankle and foot: Secondary | ICD-10-CM | POA: Insufficient documentation

## 2015-02-25 DIAGNOSIS — M7660 Achilles tendinitis, unspecified leg: Secondary | ICD-10-CM

## 2015-02-25 DIAGNOSIS — M25673 Stiffness of unspecified ankle, not elsewhere classified: Secondary | ICD-10-CM

## 2015-02-25 NOTE — Patient Instructions (Signed)
Standing calf stretches; Staggered stance 3X30 sec Df against wall - 3X30 sec

## 2015-02-25 NOTE — Therapy (Signed)
[place], [place], [address] Phone: [phone]   Fax:  [phone]  Physical Therapy Treatment  Patient Details  Name: Elizabeth Cummings MRN: [id number] Date of Birth: 12/16/1975 Referring Provider: Steffanie Rainwater, DPM  Encounter Date: 02/25/2015      PT End of Session - 02/25/15 1544    Visit Number 3   Number of Visits 12   Date for PT Re-Evaluation 03/20/15   PT Start Time 1503   PT Stop Time 1542   PT Time Calculation (min) 39 min   Activity Tolerance Patient tolerated treatment well   Behavior During Therapy [facility] for tasks assessed/performed      Past Medical History  Diagnosis Date  . Microcytic anemia 05/08/2014  . Leukocytosis 05/08/2014  . Thrombocytosis ([place]) 05/08/2014  . 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 Allergy     seasonal, dogs, cats    Past Surgical History  Procedure Laterality Date  . Cesarean section    . Appendectomy    . Cholecystectomy    . Wrist surgery      There were no vitals filed for this visit.  Visit Diagnosis:  Decreased range of motion of ankle  Achilles tendon pain  Gait abnormality      Subjective Assessment - 02/25/15 1505    Subjective Doing much better, less pain with being up and going. Still stiff when initially getting up.    Currently in Pain? Yes   Pain Score 1    Pain Location Calf   Pain Orientation Left   Aggravating Factors  initial up and walking   Pain Relieving Factors sitting and resting                         [facility] Adult PT Treatment/Exercise - 02/25/15 0001    Ultrasound   Ultrasound Location Lt distal calf/achilles   Ultrasound Parameters 1.5 w/cm2, 100%, 1MHz   Ultrasound Goals Other (Comment)  tissue prep for stretch   Manual Therapy   Manual Therapy Soft tissue mobilization   Manual therapy comments STM to calf musculature, TPR as needed   Ankle Exercises: Stretches   Gastroc Stretch 5 reps;30 seconds   manual stretches, supine, knee extended   Other Stretch staggered stance calf stretch 1X30 seconds   Other Stretch dorsiflexion against wall, 1 X 30 seconds                PT Education - 02/25/15 1542    Education provided Yes   Education Details Standing stretches, patient declined handout for reference   Person(s) Educated Patient   Methods Explanation;Verbal cues;Tactile cues;Demonstration   Comprehension Verbalized understanding;Returned demonstration             PT Long Term Goals - 02/18/15 1817    PT LONG TERM GOAL #1   Title Patient to demonstrate 15 degrees of Lt ankle dorsiflexion for walking up inclines.   Time 4   Period Weeks   Status New   PT LONG TERM GOAL #2   Title Patient to be independent with HEP for stretching and stabilization through the LLE.   Time 4   Period Weeks   Status New   PT LONG TERM GOAL #3   Title Patient to report her pain as less than or equal to 2/10 with activity   Time 4   Period Weeks   Status New  Plan - 02/25/15 1545    Clinical Impression Statement Reports feeling much better, less pain overall and much less pain with ambulation. Lt ankle dorsiflexion 4 degrees before session and 13 degrees at end. Patient responding very well, continue with current POC.    PT Next Visit Plan continue with current POC, check stretches and dorsiflexion ROM.    PT Home Exercise Plan review HEP stretches.   Consulted and Agree with Plan of Care Patient        Problem List Patient Active Problem List   Diagnosis Date Noted  . Microcytic anemia 05/08/2014  . Leukocytosis 05/08/2014  . Thrombocytosis ([place]) 05/08/2014  . Angioneurotic edema 05/08/2014    Elizabeth Cummings, PT  02/25/2015, 3:48 PM  [facility] [address] [place], [place], [address] Phone: [phone]   Fax:  [phone]  Name: Elizabeth Cummings MRN: [id number] Date of Birth:  [birth year]

## 2015-02-27 ENCOUNTER — Ambulatory Visit: Payer: 59 | Admitting: Physical Therapy

## 2015-02-27 DIAGNOSIS — M7660 Achilles tendinitis, unspecified leg: Secondary | ICD-10-CM

## 2015-02-27 DIAGNOSIS — M25673 Stiffness of unspecified ankle, not elsewhere classified: Secondary | ICD-10-CM

## 2015-02-27 DIAGNOSIS — R29898 Other symptoms and signs involving the musculoskeletal system: Secondary | ICD-10-CM | POA: Diagnosis not present

## 2015-02-27 DIAGNOSIS — R269 Unspecified abnormalities of gait and mobility: Secondary | ICD-10-CM

## 2015-02-27 NOTE — Therapy (Signed)
[place], [place], [address] Phone: [phone]   Fax:  [phone]  Physical Therapy Treatment  Patient Details  Name: Elizabeth Cummings MRN: [id number] Date of Birth: 10-08-1975 Referring Provider: Steffanie Rainwater, DPM  Encounter Date: 02/27/2015      PT End of Session - 02/27/15 1549    Visit Number 4   Number of Visits 12   Date for PT Re-Evaluation 03/20/15   PT Start Time 8937   PT Stop Time 1545   PT Time Calculation (min) 41 min   Activity Tolerance Patient tolerated treatment well;No increased pain   Behavior During Therapy [facility] for tasks assessed/performed      Past Medical History  Diagnosis Date  . Microcytic anemia 05/08/2014  . Leukocytosis 05/08/2014  . Thrombocytosis ([place]) 05/08/2014  . 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 Allergy     seasonal, dogs, cats    Past Surgical History  Procedure Laterality Date  . Cesarean section    . Appendectomy    . Cholecystectomy    . Wrist surgery      There were no vitals filed for this visit.  Visit Diagnosis:  Decreased range of motion of ankle  Achilles tendon pain  Gait abnormality      Subjective Assessment - 02/27/15 1506    Subjective Doing better still, sometimes it doesn't even hurt.    Pain Score 1    Pain Location Calf   Pain Orientation Left   Pain Descriptors / Indicators Burning                         [facility] Adult PT Treatment/Exercise - 02/27/15 0001    Ultrasound   Ultrasound Goals Other (Comment)  tissue prep for stretch   Manual Therapy   Manual Therapy Soft tissue mobilization   Manual therapy comments STM to calf musculature, TPR as needed   Ankle Exercises: Stretches   Gastroc Stretch Other (comment)  5 reps X2 sets, supine Passive   Other Stretch verbal review   Other Stretch verbal review                PT Education - 02/27/15 1548    Education provided Yes   Education Details  review of purpose of stretches and technique   Person(s) Educated Patient   Methods Explanation   Comprehension Verbalized understanding             PT Long Term Goals - 02/18/15 1817    PT LONG TERM GOAL #1   Title Patient to demonstrate 15 degrees of Lt ankle dorsiflexion for walking up inclines.   Time 4   Period Weeks   Status New   PT LONG TERM GOAL #2   Title Patient to be independent with HEP for stretching and stabilization through the LLE.   Time 4   Period Weeks   Status New   PT LONG TERM GOAL #3   Title Patient to report her pain as less than or equal to 2/10 with activity   Time 4   Period Weeks   Status New               Plan - 02/27/15 1550    Clinical Impression Statement Pateint reporting that she is contiuing to notice improvement with even having times where it is not hurting while up on her feet. Patient responding well to treatements, will consider decreased fz at next session of  continue to make progress.    PT Next Visit Plan Continue with treatent, consider ankle strengthening with Tbands. Discuss decreased frequency of session if progressing.    PT Home Exercise Plan review HEP stretches.   Consulted and Agree with Plan of Care Patient        Problem List Patient Active Problem List   Diagnosis Date Noted  . Microcytic anemia 05/08/2014  . Leukocytosis 05/08/2014  . Thrombocytosis ([place]) 05/08/2014  . Angioneurotic edema 05/08/2014    Linard Millers, PT 02/27/2015, 3:55 PM  [facility] [address] [place], [place], [address] Phone: [phone]   Fax:  [phone]  Name: KAMARIE VENO MRN: [id number] Date of Birth: 1975/10/30

## 2015-03-04 ENCOUNTER — Ambulatory Visit: Payer: 59 | Admitting: Physical Therapy

## 2015-03-04 DIAGNOSIS — M7660 Achilles tendinitis, unspecified leg: Secondary | ICD-10-CM

## 2015-03-04 DIAGNOSIS — R29898 Other symptoms and signs involving the musculoskeletal system: Secondary | ICD-10-CM | POA: Diagnosis not present

## 2015-03-04 DIAGNOSIS — M25673 Stiffness of unspecified ankle, not elsewhere classified: Secondary | ICD-10-CM

## 2015-03-04 NOTE — Therapy (Signed)
[place], [place], [address] Phone: [phone]   Fax:  [phone]  Physical Therapy Treatment  Patient Details  Name: Elizabeth Cummings MRN: [id number] Date of Birth: 1975-11-08 Referring Provider: Steffanie Rainwater, DPM  Encounter Date: 03/04/2015      PT End of Session - 03/04/15 1826    Visit Number 5   Number of Visits 12   Date for PT Re-Evaluation 03/20/15   PT Start Time 3790   PT Stop Time 1545   PT Time Calculation (min) 38 min   Activity Tolerance Patient tolerated treatment well   Behavior During Therapy [facility] for tasks assessed/performed      Past Medical History  Diagnosis Date  . Microcytic anemia 05/08/2014  . Leukocytosis 05/08/2014  . Thrombocytosis ([place]) 05/08/2014  . 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 Allergy     seasonal, dogs, cats    Past Surgical History  Procedure Laterality Date  . Cesarean section    . Appendectomy    . Cholecystectomy    . Wrist surgery      There were no vitals filed for this visit.  Visit Diagnosis:  Decreased range of motion of ankle  Achilles tendon pain      Subjective Assessment - 03/04/15 1509    Subjective Still improving, getting up over the weekend and it didn't really bother. Able to do some housework too with minimal discomfort.    Currently in Pain? Yes   Pain Score 1    Pain Location Calf   Pain Orientation Left   Pain Descriptors / Indicators --  pull   Pain Type Chronic pain   Aggravating Factors  moving around a little   Pain Relieving Factors rest                         [facility] Adult PT Treatment/Exercise - 03/04/15 0001    Ultrasound   Ultrasound Location Lt distal calf/achilles region   Ultrasound Parameters 1.5 W/cm2, 100% 1MHz   Ultrasound Goals Other (Comment)  tissue preparation   Manual Therapy   Manual Therapy Soft tissue mobilization   Manual therapy comments STM to calf musculature, TPR as  needed   Ankle Exercises: Stretches   Gastroc Stretch --  passive stretches with prolonged hold as tolerated.    Other Stretch review of HEP stretches.                PT Education - 03/04/15 1825    Education provided Yes   Education Details review of POC   Person(s) Educated Patient   Methods Explanation   Comprehension Verbalized understanding             PT Long Term Goals - 02/18/15 1817    PT LONG TERM GOAL #1   Title Patient to demonstrate 15 degrees of Lt ankle dorsiflexion for walking up inclines.   Time 4   Period Weeks   Status New   PT LONG TERM GOAL #2   Title Patient to be independent with HEP for stretching and stabilization through the LLE.   Time 4   Period Weeks   Status New   PT LONG TERM GOAL #3   Title Patient to report her pain as less than or equal to 2/10 with activity   Time 4   Period Weeks   Status New               Plan -  03/04/15 1827    Clinical Impression Statement Patient making gradual progress, will attempt to decrease frequency of sessions. Patient in agreement, to continue with HEP.    PT Next Visit Plan review progress, discuss if able to continue independently   PT Home Exercise Plan review HEP stretches.   Consulted and Agree with Plan of Care Patient        Problem List Patient Active Problem List   Diagnosis Date Noted  . Microcytic anemia 05/08/2014  . Leukocytosis 05/08/2014  . Thrombocytosis ([place]) 05/08/2014  . Angioneurotic edema 05/08/2014    Linard Millers, PT 03/04/2015, 6:34 PM  [facility] [address] [place], [place], [address] Phone: [phone]   Fax:  [phone]  Name: RISHITA PETRON MRN: [id number] Date of Birth: March 19, 1976

## 2015-03-06 ENCOUNTER — Encounter: Payer: 59 | Admitting: Physical Therapy

## 2015-03-14 ENCOUNTER — Ambulatory Visit: Payer: 59 | Admitting: Physical Therapy

## 2015-03-19 ENCOUNTER — Ambulatory Visit: Payer: 59 | Admitting: Physical Therapy

## 2015-03-19 DIAGNOSIS — R29898 Other symptoms and signs involving the musculoskeletal system: Secondary | ICD-10-CM | POA: Diagnosis not present

## 2015-03-19 DIAGNOSIS — M25673 Stiffness of unspecified ankle, not elsewhere classified: Secondary | ICD-10-CM

## 2015-03-19 DIAGNOSIS — R269 Unspecified abnormalities of gait and mobility: Secondary | ICD-10-CM

## 2015-03-19 DIAGNOSIS — M7660 Achilles tendinitis, unspecified leg: Secondary | ICD-10-CM

## 2015-03-19 NOTE — Therapy (Signed)
[place], [place], [address] Phone: [phone]   Fax:  [phone]  Physical Therapy Treatment  Patient Details  Name: Elizabeth Cummings MRN: [id number] Date of Birth: [birth year] Referring Provider: Steffanie Rainwater, DPM  Encounter Date: 03/19/2015      PT End of Session - 03/19/15 1729    Visit Number 6   Number of Visits 12   Date for PT Re-Evaluation 03/20/15   PT Start Time 6962   PT Stop Time 1630   PT Time Calculation (min) 43 min   Activity Tolerance Patient tolerated treatment well   Behavior During Therapy [facility] for tasks assessed/performed      Past Medical History  Diagnosis Date  . Microcytic anemia 05/08/2014  . Leukocytosis 05/08/2014  . Thrombocytosis ([place]) 05/08/2014  . 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 Allergy     seasonal, dogs, cats    Past Surgical History  Procedure Laterality Date  . Cesarean section    . Appendectomy    . Cholecystectomy    . Wrist surgery      There were no vitals filed for this visit.  Visit Diagnosis:  Achilles tendon pain  Gait abnormality  Decreased range of motion of ankle      Subjective Assessment - 03/19/15 1555    Subjective (p) Did not do exercises as much as she should has 2/10 medial foot .  Lasts longer is a little stiffer.     Currently in Pain? (p) Yes   Pain Score (p) 3    Pain Location (p) Ankle   Pain Orientation (p) Left;Medial   Pain Descriptors / Indicators (p) --  hurts,  stiff                         [facility] Adult PT Treatment/Exercise - 03/19/15 0001    Self-Care   Self-Care --  how to control edema,  exercises daily, no barefoot, cold    Ultrasound   Ultrasound Location Lt distal calf   Ultrasound Parameters 1.5 watts/cm2 100%  9 minutes   Ultrasound Goals Pain;Other (Comment)   Iontophoresis   Type of Iontophoresis Dexamethasone   Location posterior heel   Dose 1cc of 4 mg/Ml   Time 8, (6 hour  patch)   Manual Therapy   Manual therapy comments retrograde soft tissue work.  Hamstrings congested initially.  Heel mobilization painful with the firm grip needed so stopped.     Ankle Exercises: Aerobic   Stationary Bike Nu step   5 minutes                PT Education - 03/19/15 1724    Education provided Yes   Education Details iontophoresis precautions/do's dont't   Person(s) Educated Patient   Methods Explanation;Handout   Comprehension Verbalized understanding             PT Long Term Goals - 03/19/15 1734    PT LONG TERM GOAL #1   Title Patient to demonstrate 15 degrees of Lt ankle dorsiflexion for walking up inclines.   Time 4   Period Weeks   Status On-going   PT LONG TERM GOAL #2   Title Patient to be independent with HEP for stretching and stabilization through the LLE.   Baseline needs cues   Time 4   Period Weeks   Status On-going   PT LONG TERM GOAL #3   Title Patient to report her pain as  less than or equal to 2/10 with activity   Baseline 4/10 after 12 hours of work   Time 4   Period Weeks   Status On-going               Plan - 03/19/15 1729    Clinical Impression Statement Increased stiffness and increased pain the last day or two.  Patient contributes this to no following through with her home exercises.  Trial of IONTO at aptient's suggestion (I had mentioned a few weeks ago)   PT Next Visit Plan Ionto, [place] extension? D/C?   PT Home Exercise Plan continue    Consulted and Agree with Plan of Care Patient        Problem List Patient Active Problem List   Diagnosis Date Noted  . Microcytic anemia 05/08/2014  . Leukocytosis 05/08/2014  . Thrombocytosis ([place]) 05/08/2014  . Angioneurotic edema 05/08/2014    [facility] 03/19/2015, 5:40 PM  [facility] [address] [place], [place], [address] Phone: [phone]   Fax:  [phone]  Name: Elizabeth Cummings MRN:  [id number] Date of Birth: Feb 05, 1976    Melvenia Needles, PTA 03/19/2015 5:40 PM Phone: [phone] Fax: [phone]

## 2015-03-19 NOTE — Patient Instructions (Signed)

## 2015-03-21 ENCOUNTER — Ambulatory Visit: Payer: 59 | Admitting: Physical Therapy

## 2015-03-21 DIAGNOSIS — R29898 Other symptoms and signs involving the musculoskeletal system: Secondary | ICD-10-CM | POA: Diagnosis not present

## 2015-03-21 DIAGNOSIS — M7660 Achilles tendinitis, unspecified leg: Secondary | ICD-10-CM

## 2015-03-21 DIAGNOSIS — M25673 Stiffness of unspecified ankle, not elsewhere classified: Secondary | ICD-10-CM

## 2015-03-21 DIAGNOSIS — R269 Unspecified abnormalities of gait and mobility: Secondary | ICD-10-CM

## 2015-03-21 NOTE — Therapy (Signed)
[place], [place], [address] Phone: [phone]   Fax:  [phone]  Physical Therapy Treatment  Patient Details  Name: Elizabeth Cummings MRN: [id number] Date of Birth: [birth year] Referring Provider: Steffanie Rainwater, DPM  Encounter Date: 03/21/2015      PT End of Session - 03/21/15 1519    Visit Number 7   Number of Visits 12   Date for PT Re-Evaluation 04/18/15   PT Start Time 7096   PT Stop Time 1459   PT Time Calculation (min) 42 min   Activity Tolerance Patient tolerated treatment well   Behavior During Therapy [facility] for tasks assessed/performed      Past Medical History  Diagnosis Date  . Microcytic anemia 05/08/2014  . Leukocytosis 05/08/2014  . Thrombocytosis ([place]) 05/08/2014  . 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 Allergy     seasonal, dogs, cats    Past Surgical History  Procedure Laterality Date  . Cesarean section    . Appendectomy    . Cholecystectomy    . Wrist surgery      There were no vitals filed for this visit.  Visit Diagnosis:  Achilles tendon pain - Plan: PT plan of care cert/re-cert  Gait abnormality - Plan: PT plan of care cert/re-cert  Decreased range of motion of ankle - Plan: PT plan of care cert/re-cert      Subjective Assessment - 03/21/15 1421    Subjective Doing much better today, not sure if it was the patch or not but doing better.    Currently in Pain? Yes   Pain Score 1    Pain Location Calf   Pain Orientation Left   Pain Descriptors / Indicators Tender   Aggravating Factors  walking   Pain Relieving Factors rest            [facility] PT Assessment - 03/21/15 0001    AROM   Left Ankle Dorsiflexion 8  passive, 10 degrees with assist                     [facility] Adult PT Treatment/Exercise - 03/21/15 0001    Ultrasound   Ultrasound Location Lt achilles region   Ultrasound Parameters 1.5 w/cm2 100%, 1 MHz   Ultrasound Goals Other (Comment)   tissue preparation for stretch   Iontophoresis   Type of Iontophoresis Dexamethasone   Location distal achilles attachment   Dose 1cc of 4 mg/ml patch   Time 4 hour patch   Ankle Exercises: Stretches   Gastroc Stretch 5 reps;60 seconds   Other Stretch review of HEP stretches.                PT Education - 03/21/15 1518    Education provided Yes   Education Details review HEP, need for consistent stretches   Person(s) Educated Patient   Methods Explanation   Comprehension Verbalized understanding             PT Long Term Goals - 03/21/15 1450    PT LONG TERM GOAL #1   Title Patient to demonstrate 15 degrees of Lt ankle dorsiflexion for walking up inclines.   Time 4   Period Weeks   Status On-going   PT LONG TERM GOAL #2   Title Patient to be independent with HEP for stretching and stabilization through the LLE.   Time 4   Period Weeks   Status Achieved   PT LONG TERM GOAL #3   Title Patient  to report her pain as less than or equal to 2/10 with activity   Status Achieved               Plan - 03/21/15 1521    Clinical Impression Statement Patient reporting decreased pain since last session. Patient reporting that she slacked off on the stretches over the holiday and her pain got worse. Now doing better with pain now around 1/10. Still having some mild pulling with ambulation in the achilles region. Dorsiflexion remains limited at this time (8 degrees active) but patient is independent with stretches to perform at home. Upon discussion with the patient, we agreed for her to continue with her HEP and return next week to check on her progress. Anticipating that if she is continuing to improve she may be able to D/C to her HEP. Will assess progress at next session.    Pt will benefit from skilled therapeutic intervention in order to improve on the following deficits Abnormal gait;Decreased activity tolerance;Impaired flexibility;Decreased range of  motion;Decreased mobility;Difficulty walking;Pain   Rehab Potential Good   PT Treatment/Interventions ADLs/Self Care Home Management;Electrical Stimulation;Iontophoresis 71m/ml Dexamethasone;Moist Heat;Therapeutic exercise;Therapeutic activities;Functional mobility training;Gait training;Ultrasound;Patient/family education;Dry needling;Manual techniques   PT Next Visit Plan Check progress, may be able to continue on independent if improving. Check dorsiflexion and assess if further session are needed.    PT Home Exercise Plan continue, reinforce dorsiflexion stretch   Consulted and Agree with Plan of Care Patient        Problem List Patient Active Problem List   Diagnosis Date Noted  . Microcytic anemia 05/08/2014  . Leukocytosis 05/08/2014  . [place] 05/08/[place]  . Angioneurotic edema 05/08/2014    BLinard Millers PT 03/21/2015, 3:30 PM  [facility] [address]Phone: [phone]  Fax:  [phone] Name: Elizabeth HILAIREMRN: [id number] of Birth: 6December 29, 1977

## 2015-03-26 ENCOUNTER — Encounter: Payer: 59 | Admitting: Physical Therapy

## 2015-03-27 ENCOUNTER — Ambulatory Visit: Payer: 59 | Attending: Podiatry | Admitting: Physical Therapy

## 2015-03-27 DIAGNOSIS — M67879 Other specified disorders of synovium and tendon, unspecified ankle and foot: Secondary | ICD-10-CM | POA: Insufficient documentation

## 2015-03-27 DIAGNOSIS — M25673 Stiffness of unspecified ankle, not elsewhere classified: Secondary | ICD-10-CM

## 2015-03-27 DIAGNOSIS — R29898 Other symptoms and signs involving the musculoskeletal system: Secondary | ICD-10-CM | POA: Insufficient documentation

## 2015-03-27 DIAGNOSIS — R269 Unspecified abnormalities of gait and mobility: Secondary | ICD-10-CM | POA: Diagnosis not present

## 2015-03-27 DIAGNOSIS — M7660 Achilles tendinitis, unspecified leg: Secondary | ICD-10-CM

## 2015-03-27 MED FILL — hydrOXYzine HCL 25 MG TABS: 25 | 10 days supply | Qty: 30 | Fill #3

## 2015-03-27 MED FILL — OLOPATADINE 665 MCG NASAL S: 0.6 | 30 days supply | Qty: 31 | Fill #2

## 2015-03-27 MED FILL — NORTREL 7/7/7-28 TABLET: 0.5/0.75/1- | 84 days supply | Qty: 84 | Fill #0

## 2015-03-27 MED FILL — SM FEXOFENADINE HCL 180 MG: 180 | 30 days supply | Qty: 30 | Fill #2

## 2015-03-27 NOTE — Therapy (Signed)
[place], [place], [address] Phone: [phone]   Fax:  [phone]  Physical Therapy Treatment  Patient Details  Name: Elizabeth Cummings MRN: [id number] Date of Birth: [birth year] Referring Provider: Steffanie Rainwater, DPM  Encounter Date: 03/27/2015      PT End of Session - 03/27/15 1106    Visit Number 8   Number of Visits 12   Date for PT Re-Evaluation 04/18/15   PT Start Time 1019   PT Stop Time 1059   PT Time Calculation (min) 40 min   Activity Tolerance Patient tolerated treatment well   Behavior During Therapy [facility] for tasks assessed/performed      Past Medical History  Diagnosis Date  . Microcytic anemia 05/08/2014  . Leukocytosis 05/08/2014  . Thrombocytosis ([place]) 05/08/2014  . 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 Allergy     seasonal, dogs, cats    Past Surgical History  Procedure Laterality Date  . Cesarean section    . Appendectomy    . Cholecystectomy    . Wrist surgery      There were no vitals filed for this visit.  Visit Diagnosis:  Achilles tendon pain  Gait abnormality  Decreased range of motion of ankle      Subjective Assessment - 03/27/15 1022    Subjective Doing much better really, was consistent with stretches over the holiday and it has gone great. Just a litte pull when walking.    Currently in Pain? Yes   Pain Score 1    Pain Location Calf   Pain Orientation Left   Pain Descriptors / Indicators --  tug   Aggravating Factors  walking   Pain Relieving Factors stretching                         [facility] Adult PT Treatment/Exercise - 03/27/15 0001    Ultrasound   Ultrasound Location Distal calf/achilles region   Ultrasound Parameters 1.5 W/cm2 100%, 1Mhz   Ultrasound Goals Other (Comment)  tissue preparation for stretch   Iontophoresis   Type of Iontophoresis Dexamethasone   Location distal achilles attachment   Dose 1cc of 4 mg/ml patch   Time 4  hour patch   Manual Therapy   Manual Therapy Soft tissue mobilization   Manual therapy comments trigger point release    Ankle Exercises: Stretches   Gastroc Stretch 5 reps;60 seconds  16 degrees by end of session                PT Education - 03/27/15 1105    Education provided Yes   Education Details review of independent management, continuation of stretches   Person(s) Educated Patient   Methods Explanation;Demonstration   Comprehension Verbalized understanding;Returned demonstration             PT Long Term Goals - 03/27/15 1110    PT LONG TERM GOAL #1   Title Patient to demonstrate 15 degrees of Lt ankle dorsiflexion for walking up inclines.   Baseline 16 degrees   Status Achieved   PT LONG TERM GOAL #2   Title Patient to be independent with HEP for stretching and stabilization through the LLE.   Status Achieved   PT LONG TERM GOAL #3   Title Patient to report her pain as less than or equal to 2/10 with activity   Baseline 0-1/10 at last session   Status Achieved  Plan - 03/27/15 1107    Clinical Impression Statement Patient reports feeling like she is doing great at this point. She states that she really isn't having any pain but just a slight "tug" when ambulating. Following the sessoin the patient was able to demonstrate 16 degrees of Lt ankle dorsiflexion. At this point the patient has achieved all of her PT goals and we both agreed that she could continue on independently at this point. The patient is to be D/C to her HEP.    PT Next Visit Plan none- D/C   PT Home Exercise Plan patient to continue upon D/C   Consulted and Agree with Plan of Care Patient        Problem List Patient Active Problem List   Diagnosis Date Noted  . Microcytic anemia 05/08/2014  . Leukocytosis 05/08/2014  . Thrombocytosis ([place]) 05/08/2014  . Angioneurotic edema 05/08/2014    Linard Millers, PT 03/27/2015, 11:13 AM  [facility] [address] [place], [place], [address] Phone: [phone]   Fax:  [phone]  Name: Elizabeth Cummings MRN: [id number] Date of Birth: 1975-11-18   PHYSICAL THERAPY DISCHARGE SUMMARY  Visits from Start of Care: 8  Current functional level related to goals / functional outcomes: As noted above   Remaining deficits: As noted above   Education / Equipment: As noted above Plan: Patient agrees to discharge.  Patient goals were met. Patient is being discharged due to meeting the stated rehab goals.  ?????       Cassell Clement, PT, [place] Pager [phone]

## 2015-03-28 ENCOUNTER — Ambulatory Visit: Payer: Self-pay

## 2015-03-28 MED FILL — LEVOCETIRIZINE 5 MG TABLET: 5 | 90 days supply | Qty: 90 | Fill #0

## 2015-04-23 MED FILL — SM FEXOFENADINE 180 MG TAB: 180 | 90 days supply | Qty: 90 | Fill #0

## 2015-05-07 ENCOUNTER — Other Ambulatory Visit: Payer: Self-pay

## 2015-05-07 VITALS — BP 130/80 | HR 84 | Resp 16 | Ht 64.0 in | Wt 227.0 lb

## 2015-05-07 DIAGNOSIS — R7303 Prediabetes: Secondary | ICD-10-CM

## 2015-05-07 NOTE — Patient Outreach (Signed)
Elizabeth Cummings) Care Management   05/07/2015  Elizabeth Cummings [birth year] [id number]  Elizabeth Cummings is an 40 y.o. female.   Member seen for follow up office visit for Link to Wellness program for self management of prediabetes  Subjective: Member states she is to see Dr. Brigitte Pulse on 05/29/15.  States she tries to watch her diet but her schedule and son's sports activities make it difficult for her to make good choices.  States she has started drinking more protein shakes if she can not eat a good meal.  States she is walking on the treadmill for 1 mile 3 times a week and she is riding the exercise bike for 25 minutes the other 2 days at the [employer].  States she is still thinking about joining [employer].    Objective:   Review of Systems  All other systems reviewed and are negative.   Physical Exam Today's Vitals   05/07/15 1420  BP: 130/80  Pulse: 84  Resp: 16  Height: 1.626 m (5' 4" )  Weight: 227 lb (102.967 kg)  SpO2: 98%  PainSc: 0-No pain   Current Medications:   Current Outpatient Prescriptions  Medication Sig Dispense Refill  . calcium-vitamin D (OSCAL WITH D) 500-200 MG-UNIT per tablet Take 1 tablet by mouth daily.    Marland Kitchen EPINEPHrine (EPI-PEN) 0.3 mg/0.3 mL SOAJ injection Inject into the muscle once.    . fexofenadine (ALLEGRA) 180 MG tablet Take 180 mg by mouth daily.    . flintstones complete (FLINTSTONES) 60 MG chewable tablet Chew 1 tablet by mouth daily.    . fluocinolone (VANOS) 0.01 % cream Apply topically 2 (two) times daily.    . hydrochlorothiazide (HYDRODIURIL) 25 MG tablet Take 25 mg by mouth daily.    Marland Kitchen levocetirizine (XYZAL) 5 MG tablet Take 5 mg by mouth every evening.    . norethindrone-ethinyl estradiol (CYCLAFEM,ALYACEN) 0.5/0.75/1-35 MG-MCG tablet Take 1 tablet by mouth daily.    . diclofenac (VOLTAREN) 75 MG EC tablet Take 75 mg by mouth 2 (two) times daily. Reported on 05/07/2015     No current facility-administered  medications for this visit.    Functional Status:   In your present state of health, do you have any difficulty performing the following activities: 05/07/2015 12/27/2014  Hearing? N N  Vision? N N  Difficulty concentrating or making decisions? N N  Walking or climbing stairs? N N  Dressing or bathing? N N  Doing errands, shopping? N -  Preparing Food and eating ? - -  Using the Toilet? - -  In the past six months, have you accidently leaked urine? - -  Do you have problems with loss of bowel control? - -  Managing your Medications? - -  Managing your Finances? - -  Housekeeping or managing your Housekeeping? - -    Fall/Depression Screening:    PHQ 2/9 Scores 05/07/2015 12/27/2014 11/06/2014 09/06/2014 05/08/2014  PHQ - 2 Score 0 0 0 0 0    Assessment:   Member seen for follow up office visit for Link to Wellness program for self management of Prediabetes.  Member has decreased hemoglobin A1C from 6.7 to 6.4. Member reports exercising regularly now. She continues to eat out frequently and has difficulty with portion control. She is open to joining [employer] to help with weight loss.  Checking blood sugars infrequently but ranges from 85-125.  Member is up to date with annual eye exam and regular dental checkups.  Plan:  Plan to check blood sugar once a week, fasting or 1  to 2 hours after eating.  Goals of less than 100 fasting and less than 140 after meals Plan to limit carbohydrates to 30-45 gm per meal and 15 gm for snacks.  Plan to eat protein with snacks Plan to walk or ride bike 4 days a week for 25 minutes. Plan to do weight resistance exercises twice a week for 10 minutes Plan  to consider joining [employer] Plan to keep appointment with Dr. Brigitte Pulse on 05/29/15 Plan to return to Link to Wellness  on September 02, 2015  at 3:00PM  [facility] CM Care Plan Problem One        Most Recent Value   Care Plan Problem One  Potential for elevated blood sugars    Role Documenting the  Problem One  Care Management [place] for Problem One  Active   THN Long Term Goal (31-90 days)  Member will maintain hemoglobin A1C at or below 7 for the next 90 days   THN Long Term Goal Start Date  05/07/15 [to have checked 05/29/15 at MD appt last Hemoglobin A1C of 6.4]   Interventions for Problem One Long Term Goal  Reviewed CHO and portiion sizes, Again given handout on how to sign up for Weight Watchers with the [facility] discount, Reinforced that losing weight can help with her blood sugars and insulin resistance, Discussed making wise choices when eating  fast food, Given hand out on making good choices at fast foods,  Reinforced to get regular exercise and its effects on blood sugars, Reviewed blood sugar goals and to try checking after meals more often      Peter Garter RN, [facility] Care Management Coordinator-Link to [place] Management [phone]

## 2015-05-07 NOTE — Patient Instructions (Signed)
1. Plan to check blood sugar once a week, fasting or 1  to 2 hours after eating.  Goals of less than 100 fasting and less than 140 after meals 2. Plan to limit carbohydrates to 30-45 gm per meal and 15 gm for snacks.  Plan to eat protein with snacks 3. Plan to walk or ride bike 4 days a week for 25 minutes. Plan to do weight resistance exercises twice a week for 10 minutes 4. Plan  to consider joining [employer] 5. Plan to keep appointment with Dr. Brigitte Pulse on 05/29/15 6. Plan to return to Link to Wellness  on September 02, 2015  at 3:00PM

## 2015-05-17 MED FILL — TRUE METRIX GLUCOSE TEST ST: 90 days supply | Qty: 100 | Fill #1

## 2015-05-22 DIAGNOSIS — D2261 Melanocytic nevi of right upper limb, including shoulder: Secondary | ICD-10-CM | POA: Diagnosis not present

## 2015-05-22 DIAGNOSIS — D2262 Melanocytic nevi of left upper limb, including shoulder: Secondary | ICD-10-CM | POA: Diagnosis not present

## 2015-05-22 DIAGNOSIS — L812 Freckles: Secondary | ICD-10-CM | POA: Diagnosis not present

## 2015-05-22 DIAGNOSIS — D2271 Melanocytic nevi of right lower limb, including hip: Secondary | ICD-10-CM | POA: Diagnosis not present

## 2015-05-22 DIAGNOSIS — D2239 Melanocytic nevi of other parts of face: Secondary | ICD-10-CM | POA: Diagnosis not present

## 2015-05-22 DIAGNOSIS — D1801 Hemangioma of skin and subcutaneous tissue: Secondary | ICD-10-CM | POA: Diagnosis not present

## 2015-05-22 DIAGNOSIS — D225 Melanocytic nevi of trunk: Secondary | ICD-10-CM | POA: Diagnosis not present

## 2015-05-22 DIAGNOSIS — D2272 Melanocytic nevi of left lower limb, including hip: Secondary | ICD-10-CM | POA: Diagnosis not present

## 2015-05-22 DIAGNOSIS — D485 Neoplasm of uncertain behavior of skin: Secondary | ICD-10-CM | POA: Diagnosis not present

## 2015-05-24 ENCOUNTER — Telehealth: Payer: 59 | Admitting: Nurse Practitioner

## 2015-05-24 DIAGNOSIS — J0100 Acute maxillary sinusitis, unspecified: Secondary | ICD-10-CM

## 2015-05-24 MED ORDER — AMOXICILLIN-POT CLAVULANATE 875-125 MG PO TABS: 1.0000 | ORAL_TABLET | Freq: Two times a day (BID) | ORAL | Status: DC

## 2015-05-24 MED FILL — AMOX TR-K CLV 875-125 MG TA: 875-125 | 10 days supply | Qty: 20 | Fill #0

## 2015-05-24 NOTE — Progress Notes (Signed)

## 2015-05-29 DIAGNOSIS — Z0000 Encounter for general adult medical examination without abnormal findings: Secondary | ICD-10-CM | POA: Diagnosis not present

## 2015-05-29 DIAGNOSIS — E119 Type 2 diabetes mellitus without complications: Secondary | ICD-10-CM | POA: Diagnosis not present

## 2015-05-29 DIAGNOSIS — Z6838 Body mass index (BMI) 38.0-38.9, adult: Secondary | ICD-10-CM | POA: Diagnosis not present

## 2015-05-29 DIAGNOSIS — L501 Idiopathic urticaria: Secondary | ICD-10-CM | POA: Diagnosis not present

## 2015-05-29 DIAGNOSIS — D72829 Elevated white blood cell count, unspecified: Secondary | ICD-10-CM | POA: Diagnosis not present

## 2015-05-29 DIAGNOSIS — Z23 Encounter for immunization: Secondary | ICD-10-CM | POA: Diagnosis not present

## 2015-05-29 DIAGNOSIS — E669 Obesity, unspecified: Secondary | ICD-10-CM | POA: Diagnosis not present

## 2015-05-29 DIAGNOSIS — D473 Essential (hemorrhagic) thrombocythemia: Secondary | ICD-10-CM | POA: Diagnosis not present

## 2015-06-04 MED FILL — hydrOXYzine HCL 25 MG TABS: 25 | 10 days supply | Qty: 30 | Fill #4

## 2015-06-05 MED FILL — HYDROCHLOROTHIAZIDE 25 MG T: 25 | 90 days supply | Qty: 90 | Fill #0

## 2015-06-21 ENCOUNTER — Telehealth: Payer: 59 | Admitting: Family

## 2015-06-21 DIAGNOSIS — N390 Urinary tract infection, site not specified: Secondary | ICD-10-CM

## 2015-06-21 MED ORDER — SULFAMETHOXAZOLE-TRIMETHOPRIM 800-160 MG PO TABS: 1.0000 | ORAL_TABLET | Freq: Two times a day (BID) | ORAL | Status: DC

## 2015-06-21 MED FILL — SULFAMETHOXAZOLE/TMP DS TAB: 800-160 | 5 days supply | Qty: 10 | Fill #0

## 2015-06-21 MED FILL — LEVOCETIRIZINE 5 MG TABLET: 5 | 90 days supply | Qty: 90 | Fill #1

## 2015-06-21 MED FILL — DASETTA 7/7/7-28 TABLET: 0.5/0.75/1- | 84 days supply | Qty: 84 | Fill #1

## 2015-06-21 NOTE — Progress Notes (Signed)

## 2015-06-27 DIAGNOSIS — M25572 Pain in left ankle and joints of left foot: Secondary | ICD-10-CM | POA: Diagnosis not present

## 2015-06-27 MED FILL — NITROGLYCERIN 0.1 MG/HR PTC: 0.1 | 30 days supply | Qty: 30 | Fill #0

## 2015-07-18 DIAGNOSIS — J3081 Allergic rhinitis due to animal (cat) (dog) hair and dander: Secondary | ICD-10-CM | POA: Diagnosis not present

## 2015-07-18 DIAGNOSIS — J301 Allergic rhinitis due to pollen: Secondary | ICD-10-CM | POA: Diagnosis not present

## 2015-07-18 DIAGNOSIS — L503 Dermatographic urticaria: Secondary | ICD-10-CM | POA: Diagnosis not present

## 2015-07-18 DIAGNOSIS — J3089 Other allergic rhinitis: Secondary | ICD-10-CM | POA: Diagnosis not present

## 2015-08-29 MED FILL — OLOPATADINE 665 MCG NASAL S: 0.6 | 30 days supply | Qty: 31 | Fill #0

## 2015-08-29 MED FILL — HYDROCHLOROTHIAZIDE 25 MG T: 25 | 90 days supply | Qty: 90 | Fill #1

## 2015-08-29 MED FILL — hydrOXYzine HCL 25 MG TABS: 25 | 10 days supply | Qty: 30 | Fill #0

## 2015-09-02 ENCOUNTER — Ambulatory Visit: Payer: Self-pay

## 2015-09-02 DIAGNOSIS — E7800 Pure hypercholesterolemia, unspecified: Secondary | ICD-10-CM | POA: Diagnosis not present

## 2015-09-10 MED FILL — DASETTA 7/7/7-28 TABLET: 0.5/0.75/1- | 84 days supply | Qty: 84 | Fill #2

## 2015-10-07 ENCOUNTER — Other Ambulatory Visit: Payer: Self-pay

## 2015-10-07 VITALS — BP 122/82 | HR 78 | Resp 16 | Ht 64.0 in | Wt 232.6 lb

## 2015-10-07 DIAGNOSIS — R7303 Prediabetes: Secondary | ICD-10-CM

## 2015-10-07 NOTE — Patient Instructions (Signed)
1. Plan to check blood sugar once a week, fasting or 1  to 2 hours after eating.  Goals of less than 100 fasting and less than 140 after meals 2. Plan to limit carbohydrates to 30-45 gm per meal and 15 gm for snacks.  Plan to eat protein with snacks 3. Plan to walk or ride bike 5 days a week for 25 minutes.  4. Plan  to restart  Weight Wat[employer] 5. Plan to keep appointment with Dr. Brigitte Pulse on 12/18/15 6. Plan to return to Link to Wellness  on 01/13/16  at 3:00PM

## 2015-10-07 NOTE — Patient Outreach (Signed)
[facility]) Care Management   10/07/2015  Elizabeth Cummings 1975/10/09 [id number]  Elizabeth Cummings is an 40 y.o. female.   Member seen for follow up office visit for Link to Wellness program for self management of prediabetes  Subjective: Member states that she did join [employer] and was following the program up until the last few weeks when she went on vacation.  States she is still exercising every day at work at the gym for 25 minutes.  States she is trying to make better choices when eating a fast foods.  States that her son's baseball keeps them out most nights so it is hard to cook.  States she has not been checking her blood sugars regularly.  States she checks about 1-2 times a month and the readings range from 90-125.  Objective:   Review of Systems  All other systems reviewed and are negative.   Physical Exam Today's Vitals   10/07/15 1510  BP: 122/82  Pulse: 78  Resp: 16  Height: 1.626 m (5' 4" )  Weight: 232 lb 9.6 oz (105.507 kg)  SpO2: 98%  PainSc: 0-No pain   Encounter Medications:   Outpatient Encounter Prescriptions as of 10/07/2015  Medication Sig  . calcium-vitamin D (OSCAL WITH D) 500-200 MG-UNIT per tablet Take 1 tablet by mouth daily.  Marland Kitchen EPINEPHrine (EPI-PEN) 0.3 mg/0.3 mL SOAJ injection Inject into the muscle once.  . fexofenadine (ALLEGRA) 180 MG tablet Take 180 mg by mouth daily.  . flintstones complete (FLINTSTONES) 60 MG chewable tablet Chew 1 tablet by mouth daily.  . fluocinolone (VANOS) 0.01 % cream Apply topically 2 (two) times daily.  . hydrochlorothiazide (HYDRODIURIL) 25 MG tablet Take 25 mg by mouth daily.  . hydrOXYzine (VISTARIL) 25 MG capsule Take 25 mg by mouth daily as needed.  Marland Kitchen levocetirizine (XYZAL) 5 MG tablet Take 5 mg by mouth every evening.  . norethindrone-ethinyl estradiol (CYCLAFEM,ALYACEN) 0.5/0.75/1-35 MG-MCG tablet Take 1 tablet by mouth daily.  Marland Kitchen amoxicillin-clavulanate (AUGMENTIN) 875-125 MG  tablet Take 1 tablet by mouth 2 (two) times daily. (Patient not taking: Reported on 10/07/2015)  . diclofenac (VOLTAREN) 75 MG EC tablet Take 75 mg by mouth 2 (two) times daily. Reported on 10/07/2015  . sulfamethoxazole-trimethoprim (BACTRIM DS,SEPTRA DS) 800-160 MG tablet Take 1 tablet by mouth 2 (two) times daily. (Patient not taking: Reported on 10/07/2015)   No facility-administered encounter medications on file as of 10/07/2015.    Functional Status:   In your present state of health, do you have any difficulty performing the following activities: 10/07/2015 05/07/2015  Hearing? N N  Vision? N N  Difficulty concentrating or making decisions? N N  Walking or climbing stairs? N N  Dressing or bathing? N N  Doing errands, shopping? N N    Fall/Depression Screening:    PHQ 2/9 Scores 10/07/2015 05/07/2015 12/27/2014 11/06/2014 09/06/2014 05/08/2014  PHQ - 2 Score 0 0 0 0 0 0    Assessment:  Member seen for follow up office visit for Link to Wellness program for self management of Prediabetes.  Member is maintaining self management goal with last hemoglobin A1C of 6.4%. Member reports exercising regularly at work. She continues to eat out frequently and has difficulty with portion control. She has joined [employer] but she has not been following plan for the last few weeks while on vacation. She continues to check CBGs infrequently but ranges from 90-125. Member is up to date with annual eye exam and regular  dental checkups.  Plan:  Plan to check blood sugar once a week, fasting or 1  to 2 hours after eating.  Goals of less than 100 fasting and less than 140 after meals Plan to limit carbohydrates to 30-45 gm per meal and 15 gm for snacks.  Plan to eat protein with snacks 1. Plan to walk or ride bike 5 days a week for 25 minutes.  Plan  to restart  Weight Watchers Plan to keep appointment with Dr. Brigitte Pulse on 12/18/15 Plan to return to Link to Wellness  on 01/13/16  at 3:00PM [facility] CM Care  Plan Problem One        Most Recent Value   Care Plan Problem One  Potential for elevated blood sugars as evidenced by hemoglobin A1C of 6.4% related to dx of prediabetes    Role Documenting the Problem One  Care Management Coordinator   Care Plan for Problem One  Active   THN Long Term Goal (31-90 days)  Member will maintain hemoglobin A1C at or below 7 for the next 90 days   THN Long Term Goal Start Date  10/07/15   Interventions for Problem One Long Term Goal  Reviewed CHO and portiion sizes,  Encouraged to resume following  Weight Watchers plan, Reinforced that losing weight can help with her blood sugars and insulin resistance, Again discussed making wise choices when eating  fast food,  Reinforced to get regular exercise and its effects on blood sugars, Reviewed blood sugar goals and to try checking after meals more often      Peter Garter RN, [facility] Care Management Coordinator-Link to [place] Management [phone]

## 2015-10-13 ENCOUNTER — Telehealth: Payer: 59 | Admitting: Family

## 2015-10-13 DIAGNOSIS — N390 Urinary tract infection, site not specified: Secondary | ICD-10-CM | POA: Diagnosis not present

## 2015-10-13 MED ORDER — SULFAMETHOXAZOLE-TRIMETHOPRIM 800-160 MG PO TABS: 1.0000 | ORAL_TABLET | Freq: Two times a day (BID) | ORAL | 0 refills | Status: DC

## 2015-10-13 NOTE — Progress Notes (Signed)

## 2015-12-02 MED FILL — DASETTA 7/7/7-28 TABLET: 0.5/0.75/1- | 84 days supply | Qty: 84 | Fill #3

## 2015-12-02 MED FILL — HYDROCHLOROTHIAZIDE 25 MG T: 25 | 90 days supply | Qty: 90 | Fill #2

## 2015-12-02 MED FILL — LEVOCETIRIZINE 5 MG TABLET: 5 | 30 days supply | Qty: 30 | Fill #0

## 2015-12-02 MED FILL — OLOPATADINE 665 MCG NASAL S: 0.6 | 30 days supply | Qty: 31 | Fill #1

## 2015-12-18 DIAGNOSIS — E119 Type 2 diabetes mellitus without complications: Secondary | ICD-10-CM | POA: Diagnosis not present

## 2015-12-18 DIAGNOSIS — D72829 Elevated white blood cell count, unspecified: Secondary | ICD-10-CM | POA: Diagnosis not present

## 2015-12-18 DIAGNOSIS — E669 Obesity, unspecified: Secondary | ICD-10-CM | POA: Diagnosis not present

## 2016-01-13 ENCOUNTER — Ambulatory Visit: Payer: Self-pay

## 2016-01-29 DIAGNOSIS — Z130 Encounter for screening for diseases of the blood and blood-forming organs and certain disorders involving the immune mechanism: Secondary | ICD-10-CM | POA: Diagnosis not present

## 2016-01-29 DIAGNOSIS — Z1389 Encounter for screening for other disorder: Secondary | ICD-10-CM | POA: Diagnosis not present

## 2016-01-29 DIAGNOSIS — Z1231 Encounter for screening mammogram for malignant neoplasm of breast: Secondary | ICD-10-CM | POA: Diagnosis not present

## 2016-01-29 DIAGNOSIS — Z1151 Encounter for screening for human papillomavirus (HPV): Secondary | ICD-10-CM | POA: Diagnosis not present

## 2016-01-29 DIAGNOSIS — Z01419 Encounter for gynecological examination (general) (routine) without abnormal findings: Secondary | ICD-10-CM | POA: Diagnosis not present

## 2016-01-29 DIAGNOSIS — Z124 Encounter for screening for malignant neoplasm of cervix: Secondary | ICD-10-CM | POA: Diagnosis not present

## 2016-01-29 DIAGNOSIS — Z6841 Body Mass Index (BMI) 40.0 and over, adult: Secondary | ICD-10-CM | POA: Diagnosis not present

## 2016-01-29 DIAGNOSIS — Z793 Long term (current) use of hormonal contraceptives: Secondary | ICD-10-CM | POA: Diagnosis not present

## 2016-01-30 DIAGNOSIS — Z124 Encounter for screening for malignant neoplasm of cervix: Secondary | ICD-10-CM | POA: Diagnosis not present

## 2016-02-06 DIAGNOSIS — H1789 Other corneal scars and opacities: Secondary | ICD-10-CM | POA: Diagnosis not present

## 2016-02-06 DIAGNOSIS — H524 Presbyopia: Secondary | ICD-10-CM | POA: Diagnosis not present

## 2016-02-06 DIAGNOSIS — H5213 Myopia, bilateral: Secondary | ICD-10-CM | POA: Diagnosis not present

## 2016-02-17 ENCOUNTER — Telehealth: Payer: 59 | Admitting: Family

## 2016-02-17 DIAGNOSIS — N3000 Acute cystitis without hematuria: Secondary | ICD-10-CM | POA: Diagnosis not present

## 2016-02-17 MED ORDER — NITROFURANTOIN MONOHYD MACRO 100 MG PO CAPS: 100.0000 mg | ORAL_CAPSULE | Freq: Two times a day (BID) | ORAL | 0 refills | Status: DC

## 2016-02-17 NOTE — Progress Notes (Signed)

## 2016-02-18 MED FILL — OLOPATADINE 665 MCG NASAL S: 0.6 | 30 days supply | Qty: 31 | Fill #2

## 2016-02-18 MED FILL — hydrOXYzine HCL 25 MG TABS: 25 | 10 days supply | Qty: 30 | Fill #1

## 2016-02-18 MED FILL — NITROFURANTOIN MONO-MCR 100: 100 | 5 days supply | Qty: 10 | Fill #0

## 2016-02-18 MED FILL — DASETTA 7/7/7-28 TABLET: 0.5/0.75/1- | 84 days supply | Qty: 84 | Fill #0

## 2016-02-26 MED FILL — HYDROCHLOROTHIAZIDE 25 MG T: 25 | 90 days supply | Qty: 90 | Fill #0

## 2016-02-26 MED FILL — LEVOCETIRIZINE 5 MG TABLET: 5 | 90 days supply | Qty: 90 | Fill #1

## 2016-03-02 ENCOUNTER — Other Ambulatory Visit: Payer: Self-pay

## 2016-03-02 VITALS — BP 118/82 | HR 86 | Resp 14 | Ht 64.0 in | Wt 231.2 lb

## 2016-03-02 DIAGNOSIS — R7303 Prediabetes: Secondary | ICD-10-CM | POA: Insufficient documentation

## 2016-03-02 NOTE — Patient Outreach (Signed)
[facility]) Care Management   03/02/2016  ALEAYAH CHICO 06-11-1975 [id number]  Elizabeth Cummings is an 40 y.o. female.   Member seen for follow up office visit for Link to Wellness program for self management prediabetes  Subjective: Member states that she saw her MD in September and her hemoglobin A1C was 6.6%.  States she is still not on any medications for diabetes.  States she is still walking on the treadmill 3 days a week and riding the exercise bike 2 times a week for 25 minutes.  States she still eats takeout frequently but she does try to watch the CHO in her meals.  States she has enrolled in the [facility] program.  Objective:   Review of Systems  All other systems reviewed and are negative.   Physical Exam Today's Vitals   03/02/16 1516 03/02/16 1524  BP: 118/82   Pulse: 86   Resp: 14   SpO2: 98%   Weight: 231 lb 3.2 oz (104.9 kg)   Height: 1.626 m (5' 4" )   PainSc: 0-No pain 0-No pain   Encounter Medications:   Outpatient Encounter Prescriptions as of 03/02/2016  Medication Sig  . calcium-vitamin D (OSCAL WITH D) 500-200 MG-UNIT per tablet Take 1 tablet by mouth daily.  Marland Kitchen EPINEPHrine (EPI-PEN) 0.3 mg/0.3 mL SOAJ injection Inject into the muscle once.  . fexofenadine (ALLEGRA) 180 MG tablet Take 180 mg by mouth daily.  . flintstones complete (FLINTSTONES) 60 MG chewable tablet Chew 1 tablet by mouth daily.  . fluocinolone (VANOS) 0.01 % cream Apply topically 2 (two) times daily.  . hydrochlorothiazide (HYDRODIURIL) 25 MG tablet Take 25 mg by mouth daily.  . hydrOXYzine (VISTARIL) 25 MG capsule Take 25 mg by mouth daily as needed.  Marland Kitchen levocetirizine (XYZAL) 5 MG tablet Take 5 mg by mouth every evening.  . norethindrone-ethinyl estradiol (CYCLAFEM,ALYACEN) 0.5/0.75/1-35 MG-MCG tablet Take 1 tablet by mouth daily.  Marland Kitchen amoxicillin-clavulanate (AUGMENTIN) 875-125 MG tablet Take 1 tablet by mouth 2 (two) times daily. (Patient not taking: Reported on  03/02/2016)  . diclofenac (VOLTAREN) 75 MG EC tablet Take 75 mg by mouth 2 (two) times daily. Reported on 10/07/2015  . nitrofurantoin, macrocrystal-monohydrate, (MACROBID) 100 MG capsule Take 1 capsule (100 mg total) by mouth 2 (two) times daily. 1 po BId (Patient not taking: Reported on 03/02/2016)  . sulfamethoxazole-trimethoprim (BACTRIM DS,SEPTRA DS) 800-160 MG tablet Take 1 tablet by mouth 2 (two) times daily. (Patient not taking: Reported on 03/02/2016)   No facility-administered encounter medications on file as of 03/02/2016.     Functional Status:   In your present state of health, do you have any difficulty performing the following activities: 03/02/2016 10/07/2015  Hearing? N N  Vision? N N  Difficulty concentrating or making decisions? N N  Walking or climbing stairs? N N  Dressing or bathing? N N  Doing errands, shopping? N N  Some recent data might be hidden    Fall/Depression Screening:    PHQ 2/9 Scores 03/02/2016 10/07/2015 05/07/2015 12/27/2014 11/06/2014 09/06/2014 05/08/2014  PHQ - 2 Score 0 0 0 0 0 0 0    Assessment:  Member seen for follow up office visit for Link to Wellness program for self management of Prediabetes.  Member is maintaining self management goal with last hemoglobin A1C of 6.6%. Member reports exercising regularly at work. She continues to eat out frequently and has difficulty with portion control. Member still has Weight Watchers membership but she has not been following plan.  She continues to check CBGs infrequently but ranges from 75-103. Member is up to date with annual eye exam and regular dental checkups.  Member has enrolled in the [place] program  Plan:   Plan to check blood sugar once a week, fasting or 1  to 2 hours after eating.  Goals of less than 100 fasting and less than 140 after meals Plan to limit carbohydrates to 30-45 gm per meal and 15 gm for snacks.  Plan to eat protein with snacks Plan to walk or ride bike 5 days a week for 25  minutes.  Plan  to restart  Weight Watchers Plan to keep appointment with Dr. Brigitte Pulse on 07/27/16 Plan to follow through the [facility] program   [facility] CM Care Plan Problem One   Flowsheet Row Most Recent Value  Care Plan Problem One  Potential for elevated blood sugars as evidenced by hemoglobin A1C of 6.4% related to dx of prediabetes   Role Documenting the Problem One  Care Management Coordinator  Care Plan for Problem One  Active  THN Long Term Goal (31-90 days)  Member will maintain hemoglobin A1C at or below 7 for the next 90 days  THN Long Term Goal Start Date  03/02/16  Interventions for Problem One Long Term Goal  Reviewed CHO and portiion sizes,  Encouraged to resume following  Weight Watchers plan, Reinforced that losing weight can help with her blood sugars and insulin resistance, Reviewed making wise choices when eating  fast food,  Reinforced to get regular exercise and its effects on blood sugars, Reviewed blood sugar goals and to try checking after meals more often , Instructed she will be followed through the [facility] program next year and given handout on [place], [facility] Care Management Coordinator-Link to [place] Management [phone]

## 2016-03-02 NOTE — Patient Instructions (Signed)
1. Plan to check blood sugar once a week, fasting or 1  to 2 hours after eating.  Goals of less than 100 fasting and less than 140 after meals 2. Plan to limit carbohydrates to 30-45 gm per meal and 15 gm for snacks.  Plan to eat protein with snacks 3. Plan to walk or ride bike 5 days a week for 25 minutes.  4. Plan  to restart  Weight Watchers 5. Plan to keep appointment with Dr. Brigitte Pulse on 07/27/16 6. Plan to follow through the [facility] program

## 2016-03-18 ENCOUNTER — Telehealth: Payer: 59 | Admitting: Family

## 2016-03-18 DIAGNOSIS — N390 Urinary tract infection, site not specified: Secondary | ICD-10-CM

## 2016-03-18 MED ORDER — CIPROFLOXACIN HCL 500 MG PO TABS: 500.0000 mg | ORAL_TABLET | Freq: Two times a day (BID) | ORAL | 0 refills | Status: DC

## 2016-03-18 MED FILL — CIPROFLOXACIN HCL 500 MG TA: 500 | 5 days supply | Qty: 10 | Fill #0

## 2016-03-18 MED FILL — hydrOXYzine HCL 25 MG TABS: 25 | 10 days supply | Qty: 30 | Fill #2

## 2016-03-18 MED FILL — OLOPATADINE 665 MCG NASAL S: 0.6 | 30 days supply | Qty: 31 | Fill #3

## 2016-03-18 NOTE — Progress Notes (Signed)
We are sorry that you are not feeling well.  Here is how we plan to help!  Based on what you shared with me it looks like you most likely have a simple urinary tract infection.  A UTI (Urinary Tract Infection) is a bacterial infection of the bladder.  Most cases of urinary tract infections are simple to treat but a key part of your care is to encourage you to drink plenty of fluids and watch your symptoms carefully.  I have prescribed Ciprofloxacin 500 mg twice a day for 5 days.  Your symptoms should gradually improve. Call us if the burning in your urine worsens, you develop worsening fever, back pain or pelvic pain or if your symptoms do not resolve after completing the antibiotic.  If this does not clear up you will need to follow up with your PCP since you were just treated last month for a UTI. You may need to get a urine culture.   Urinary tract infections can be prevented by drinking plenty of water to keep your body hydrated.  Also be sure when you wipe, wipe from front to back and don't hold it in!  If possible, empty your bladder every 4 hours.  Your e-visit answers were reviewed by a board certified advanced clinical practitioner to complete your personal care plan.  Depending on the condition, your plan could have included both over the counter or prescription medications.  If there is a problem please reply  once you have received a response from your provider.  Your safety is important to [place].  If you have drug allergies check your prescription carefully.    You can use MyChart to ask questions about today's visit, request a non-urgent call back, or ask for a work or school excuse for 24 hours related to this e-Visit. If it has been greater than 24 hours you will need to follow up with your provider, or enter a new e-Visit to address those concerns.   You will get an e-mail in the next two days asking about your experience.  I hope that your e-visit has been valuable and will  speed your recovery. Thank you for using e-visits.

## 2016-04-30 DIAGNOSIS — F4322 Adjustment disorder with anxiety: Secondary | ICD-10-CM | POA: Diagnosis not present

## 2016-05-21 DIAGNOSIS — L03031 Cellulitis of right toe: Secondary | ICD-10-CM | POA: Diagnosis not present

## 2016-05-21 DIAGNOSIS — L600 Ingrowing nail: Secondary | ICD-10-CM | POA: Diagnosis not present

## 2016-05-21 MED FILL — HYDROCHLOROTHIAZIDE 25 MG T: 25 | 90 days supply | Qty: 90 | Fill #1

## 2016-05-21 MED FILL — DASETTA 7/7/7-28 TABLET: 0.5/0.75/1- | 84 days supply | Qty: 84 | Fill #1

## 2016-05-22 MED FILL — MUPIROCIN 2% OINTMENT: 2 | 20 days supply | Qty: 22 | Fill #0

## 2016-05-26 DIAGNOSIS — H9042 Sensorineural hearing loss, unilateral, left ear, with unrestricted hearing on the contralateral side: Secondary | ICD-10-CM | POA: Diagnosis not present

## 2016-07-16 DIAGNOSIS — J3081 Allergic rhinitis due to animal (cat) (dog) hair and dander: Secondary | ICD-10-CM | POA: Diagnosis not present

## 2016-07-16 DIAGNOSIS — J301 Allergic rhinitis due to pollen: Secondary | ICD-10-CM | POA: Diagnosis not present

## 2016-07-16 DIAGNOSIS — J3089 Other allergic rhinitis: Secondary | ICD-10-CM | POA: Diagnosis not present

## 2016-07-16 DIAGNOSIS — L503 Dermatographic urticaria: Secondary | ICD-10-CM | POA: Diagnosis not present

## 2016-07-27 DIAGNOSIS — D72829 Elevated white blood cell count, unspecified: Secondary | ICD-10-CM | POA: Diagnosis not present

## 2016-07-27 DIAGNOSIS — D473 Essential (hemorrhagic) thrombocythemia: Secondary | ICD-10-CM | POA: Diagnosis not present

## 2016-07-27 DIAGNOSIS — L501 Idiopathic urticaria: Secondary | ICD-10-CM | POA: Diagnosis not present

## 2016-07-27 DIAGNOSIS — E669 Obesity, unspecified: Secondary | ICD-10-CM | POA: Diagnosis not present

## 2016-07-27 DIAGNOSIS — Z0000 Encounter for general adult medical examination without abnormal findings: Secondary | ICD-10-CM | POA: Diagnosis not present

## 2016-07-27 DIAGNOSIS — E119 Type 2 diabetes mellitus without complications: Secondary | ICD-10-CM | POA: Diagnosis not present

## 2016-07-30 MED FILL — PRAVASTATIN SODIUM 20 MG TA: 20 | 30 days supply | Qty: 30 | Fill #0

## 2016-08-05 MED FILL — FLUOCINONIDE 0.05% CREAM: 0.05 | 30 days supply | Qty: 60 | Fill #0

## 2016-08-06 MED FILL — DASETTA 7/7/7-28 TABLET: 0.5/0.75/1- | 84 days supply | Qty: 84 | Fill #2

## 2016-08-12 DIAGNOSIS — D2272 Melanocytic nevi of left lower limb, including hip: Secondary | ICD-10-CM | POA: Diagnosis not present

## 2016-08-12 DIAGNOSIS — D225 Melanocytic nevi of trunk: Secondary | ICD-10-CM | POA: Diagnosis not present

## 2016-08-12 DIAGNOSIS — L918 Other hypertrophic disorders of the skin: Secondary | ICD-10-CM | POA: Diagnosis not present

## 2016-08-12 DIAGNOSIS — D2261 Melanocytic nevi of right upper limb, including shoulder: Secondary | ICD-10-CM | POA: Diagnosis not present

## 2016-08-12 DIAGNOSIS — L814 Other melanin hyperpigmentation: Secondary | ICD-10-CM | POA: Diagnosis not present

## 2016-08-12 DIAGNOSIS — L812 Freckles: Secondary | ICD-10-CM | POA: Diagnosis not present

## 2016-08-12 DIAGNOSIS — D2262 Melanocytic nevi of left upper limb, including shoulder: Secondary | ICD-10-CM | POA: Diagnosis not present

## 2016-08-12 DIAGNOSIS — D2271 Melanocytic nevi of right lower limb, including hip: Secondary | ICD-10-CM | POA: Diagnosis not present

## 2016-08-12 DIAGNOSIS — L720 Epidermal cyst: Secondary | ICD-10-CM | POA: Diagnosis not present

## 2016-08-25 MED FILL — HYDROCHLOROTHIAZIDE 25 MG T: 25 | 90 days supply | Qty: 90 | Fill #2

## 2016-09-26 ENCOUNTER — Telehealth: Payer: 59 | Admitting: Family

## 2016-09-26 DIAGNOSIS — N390 Urinary tract infection, site not specified: Secondary | ICD-10-CM | POA: Diagnosis not present

## 2016-09-26 MED ORDER — CEPHALEXIN 500 MG PO CAPS: 500.0000 mg | ORAL_CAPSULE | Freq: Two times a day (BID) | ORAL | 0 refills | Status: DC

## 2016-09-26 NOTE — Progress Notes (Signed)
Thank you for the details you put in the comment boxes. Those details really help us take better care of you.   We are sorry that you are not feeling well.  Here is how we plan to help!  Based on what you shared with me it looks like you most likely have a simple urinary tract infection.  A UTI (Urinary Tract Infection) is a bacterial infection of the bladder.  Most cases of urinary tract infections are simple to treat but a key part of your care is to encourage you to drink plenty of fluids and watch your symptoms carefully.  I have prescribed Keflex 500 mg twice a day for 7 days.  Your symptoms should gradually improve. Call us if the burning in your urine worsens, you develop worsening fever, back pain or pelvic pain or if your symptoms do not resolve after completing the antibiotic.  Urinary tract infections can be prevented by drinking plenty of water to keep your body hydrated.  Also be sure when you wipe, wipe from front to back and don't hold it in!  If possible, empty your bladder every 4 hours.  Your e-visit answers were reviewed by a board certified advanced clinical practitioner to complete your personal care plan.  Depending on the condition, your plan could have included both over the counter or prescription medications.  If there is a problem please reply  once you have received a response from your provider.  Your safety is important to us.  If you have drug allergies check your prescription carefully.    You can use MyChart to ask questions about today's visit, request a non-urgent call back, or ask for a work or school excuse for 24 hours related to this e-Visit. If it has been greater than 24 hours you will need to follow up with your provider, or enter a new e-Visit to address those concerns.   You will get an e-mail in the next two days asking about your experience.  I hope that your e-visit has been valuable and will speed your recovery. Thank you for using  e-visits.    

## 2016-11-20 MED FILL — DASETTA 7/7/7-28 TABLET: 0.5/0.75/1- | 84 days supply | Qty: 84 | Fill #3

## 2016-11-27 MED FILL — SM FEXOFENADINE 180 MG TAB: 180 | 90 days supply | Qty: 90 | Fill #0

## 2016-11-27 MED FILL — HYDROCHLOROTHIAZIDE 25 MG T: 25 | 90 days supply | Qty: 90 | Fill #0

## 2016-11-27 MED FILL — LEVOCETIRIZINE 5 MG TABLET: 5 | 30 days supply | Qty: 30 | Fill #0

## 2017-02-05 MED FILL — LEVOCETIRIZINE 5 MG TABLET: 5 | 30 days supply | Qty: 30 | Fill #1

## 2017-02-05 MED FILL — DASETTA 7/7/7-28 TABLET: 0.5/0.75/1- | 28 days supply | Qty: 28 | Fill #0

## 2017-02-10 DIAGNOSIS — H5213 Myopia, bilateral: Secondary | ICD-10-CM | POA: Diagnosis not present

## 2017-02-15 DIAGNOSIS — E7800 Pure hypercholesterolemia, unspecified: Secondary | ICD-10-CM | POA: Diagnosis not present

## 2017-02-15 DIAGNOSIS — E119 Type 2 diabetes mellitus without complications: Secondary | ICD-10-CM | POA: Diagnosis not present

## 2017-02-15 DIAGNOSIS — R5383 Other fatigue: Secondary | ICD-10-CM | POA: Diagnosis not present

## 2017-02-15 DIAGNOSIS — Z6838 Body mass index (BMI) 38.0-38.9, adult: Secondary | ICD-10-CM | POA: Diagnosis not present

## 2017-02-15 MED FILL — ACCU-CHEK FASTCLIX LANCETS: 90 days supply | Qty: 102 | Fill #0

## 2017-02-15 MED FILL — ACCU-CHEK GUIDE TEST STRIP: 90 days supply | Qty: 100 | Fill #0

## 2017-03-02 ENCOUNTER — Other Ambulatory Visit: Payer: Self-pay

## 2017-03-02 MED FILL — LEVOCETIRIZINE 5 MG TABLET: 5 | 90 days supply | Qty: 90 | Fill #2

## 2017-03-02 MED FILL — HYDROCHLOROTHIAZIDE 25 MG T: 25 | 90 days supply | Qty: 90 | Fill #1

## 2017-03-02 MED FILL — PRAVASTATIN SODIUM 20 MG TA: 20 | 90 days supply | Qty: 90 | Fill #1

## 2017-03-02 NOTE — Patient Outreach (Signed)
[facility]) Care Management  03/02/2017  Elizabeth Cummings [birth year] [id number]   Case closed in [place].  Member is being followed by the [employer] program Member enrolled in an external program. Peter Garter RN, [facility] Care Management Coordinator-Link to [place] Management [phone]

## 2017-03-04 DIAGNOSIS — Z1231 Encounter for screening mammogram for malignant neoplasm of breast: Secondary | ICD-10-CM | POA: Diagnosis not present

## 2017-03-04 DIAGNOSIS — Z01419 Encounter for gynecological examination (general) (routine) without abnormal findings: Secondary | ICD-10-CM | POA: Diagnosis not present

## 2017-03-04 DIAGNOSIS — N898 Other specified noninflammatory disorders of vagina: Secondary | ICD-10-CM | POA: Diagnosis not present

## 2017-03-04 DIAGNOSIS — Z130 Encounter for screening for diseases of the blood and blood-forming organs and certain disorders involving the immune mechanism: Secondary | ICD-10-CM | POA: Diagnosis not present

## 2017-03-04 DIAGNOSIS — Z3040 Encounter for surveillance of contraceptives, unspecified: Secondary | ICD-10-CM | POA: Diagnosis not present

## 2017-03-04 DIAGNOSIS — Z1389 Encounter for screening for other disorder: Secondary | ICD-10-CM | POA: Diagnosis not present

## 2017-03-04 MED FILL — DASETTA 7/7/7-28 TABLET: 0.5/0.75/1- | 84 days supply | Qty: 84 | Fill #0

## 2017-04-05 ENCOUNTER — Telehealth: Payer: 59 | Admitting: Family

## 2017-04-05 DIAGNOSIS — N390 Urinary tract infection, site not specified: Secondary | ICD-10-CM | POA: Diagnosis not present

## 2017-04-05 MED ORDER — CEPHALEXIN 500 MG PO CAPS: 500.0000 mg | ORAL_CAPSULE | Freq: Two times a day (BID) | ORAL | 0 refills | Status: DC

## 2017-04-05 MED FILL — CEPHALEXIN 500 MG CAPSULE: 500 | 7 days supply | Qty: 14 | Fill #0

## 2017-04-05 NOTE — Progress Notes (Signed)
Thank you for the details you included in the comment boxes. Those details are very helpful in determining the best course of treatment for you and help [place] to provide the best care.  We are sorry that you are not feeling well.  Here is how we plan to help!  Based on what you shared with me it looks like you most likely have a simple urinary tract infection.  A UTI (Urinary Tract Infection) is a bacterial infection of the bladder.  Most cases of urinary tract infections are simple to treat but a key part of your care is to encourage you to drink plenty of fluids and watch your symptoms carefully.  I have prescribed Keflex 500 mg twice a day for 7 days.  Your symptoms should gradually improve. Call us if the burning in your urine worsens, you develop worsening fever, back pain or pelvic pain or if your symptoms do not resolve after completing the antibiotic.  Urinary tract infections can be prevented by drinking plenty of water to keep your body hydrated.  Also be sure when you wipe, wipe from front to back and don't hold it in!  If possible, empty your bladder every 4 hours.  Your e-visit answers were reviewed by a board certified advanced clinical practitioner to complete your personal care plan.  Depending on the condition, your plan could have included both over the counter or prescription medications.  If there is a problem please reply  once you have received a response from your provider.  Your safety is important to [place].  If you have drug allergies check your prescription carefully.    You can use MyChart to ask questions about today's visit, request a non-urgent call back, or ask for a work or school excuse for 24 hours related to this e-Visit. If it has been greater than 24 hours you will need to follow up with your provider, or enter a new e-Visit to address those concerns.   You will get an e-mail in the next two days asking about your experience.  I hope that your e-visit has been  valuable and will speed your recovery. Thank you for using e-visits.

## 2017-05-19 DIAGNOSIS — E7800 Pure hypercholesterolemia, unspecified: Secondary | ICD-10-CM | POA: Diagnosis not present

## 2017-05-26 MED FILL — HYDROCHLOROTHIAZIDE 25 MG T: 25 | 90 days supply | Qty: 90 | Fill #2

## 2017-05-26 MED FILL — LEVOCETIRIZINE 5 MG TABLET: 5 | 30 days supply | Qty: 30 | Fill #3

## 2017-05-26 MED FILL — ACCU-CHEK GUIDE TEST STRIP: 90 days supply | Qty: 100 | Fill #1

## 2017-05-26 MED FILL — DASETTA 7/7/7-28 TABLET: 0.5/0.75/1- | 84 days supply | Qty: 84 | Fill #1

## 2017-05-26 MED FILL — ACCU-CHEK FASTCLIX LANCETS: 90 days supply | Qty: 102 | Fill #1

## 2017-05-26 MED FILL — SM FEXOFENADINE 180 MG TAB: 180 | 90 days supply | Qty: 90 | Fill #1

## 2017-07-16 MED FILL — LEVOCETIRIZINE 5 MG TABLET: 5 | 30 days supply | Qty: 30 | Fill #0

## 2017-08-02 DIAGNOSIS — J3081 Allergic rhinitis due to animal (cat) (dog) hair and dander: Secondary | ICD-10-CM | POA: Diagnosis not present

## 2017-08-02 DIAGNOSIS — J301 Allergic rhinitis due to pollen: Secondary | ICD-10-CM | POA: Diagnosis not present

## 2017-08-02 DIAGNOSIS — L503 Dermatographic urticaria: Secondary | ICD-10-CM | POA: Diagnosis not present

## 2017-08-02 DIAGNOSIS — J3089 Other allergic rhinitis: Secondary | ICD-10-CM | POA: Diagnosis not present

## 2017-08-19 DIAGNOSIS — E119 Type 2 diabetes mellitus without complications: Secondary | ICD-10-CM | POA: Diagnosis not present

## 2017-08-19 DIAGNOSIS — D72829 Elevated white blood cell count, unspecified: Secondary | ICD-10-CM | POA: Diagnosis not present

## 2017-08-19 DIAGNOSIS — D473 Essential (hemorrhagic) thrombocythemia: Secondary | ICD-10-CM | POA: Diagnosis not present

## 2017-08-19 DIAGNOSIS — L501 Idiopathic urticaria: Secondary | ICD-10-CM | POA: Diagnosis not present

## 2017-08-19 DIAGNOSIS — Z0000 Encounter for general adult medical examination without abnormal findings: Secondary | ICD-10-CM | POA: Diagnosis not present

## 2017-08-19 DIAGNOSIS — E669 Obesity, unspecified: Secondary | ICD-10-CM | POA: Diagnosis not present

## 2017-08-19 DIAGNOSIS — Z6839 Body mass index (BMI) 39.0-39.9, adult: Secondary | ICD-10-CM | POA: Diagnosis not present

## 2017-08-19 MED FILL — DASETTA 7/7/7-28 TABLET: 0.5/0.75/1- | 84 days supply | Qty: 84 | Fill #2

## 2017-08-19 MED FILL — SM FEXOFENADINE HCL 180 MG: 180 MG | 90 days supply | Qty: 90 | Fill #0

## 2017-08-19 MED FILL — LEVOCETIRIZINE 5 MG TABLET: 5 | 90 days supply | Qty: 90 | Fill #0

## 2017-08-20 MED FILL — HYDROCHLOROTHIAZIDE 25 MG T: 25 | 90 days supply | Qty: 90 | Fill #0

## 2017-09-02 DIAGNOSIS — D2261 Melanocytic nevi of right upper limb, including shoulder: Secondary | ICD-10-CM | POA: Diagnosis not present

## 2017-09-02 DIAGNOSIS — D2272 Melanocytic nevi of left lower limb, including hip: Secondary | ICD-10-CM | POA: Diagnosis not present

## 2017-09-02 DIAGNOSIS — D2239 Melanocytic nevi of other parts of face: Secondary | ICD-10-CM | POA: Diagnosis not present

## 2017-09-02 DIAGNOSIS — L814 Other melanin hyperpigmentation: Secondary | ICD-10-CM | POA: Diagnosis not present

## 2017-09-02 DIAGNOSIS — D2271 Melanocytic nevi of right lower limb, including hip: Secondary | ICD-10-CM | POA: Diagnosis not present

## 2017-09-02 DIAGNOSIS — L821 Other seborrheic keratosis: Secondary | ICD-10-CM | POA: Diagnosis not present

## 2017-09-02 DIAGNOSIS — D225 Melanocytic nevi of trunk: Secondary | ICD-10-CM | POA: Diagnosis not present

## 2017-09-12 ENCOUNTER — Telehealth: Payer: 59 | Admitting: Family

## 2017-09-12 DIAGNOSIS — J019 Acute sinusitis, unspecified: Secondary | ICD-10-CM | POA: Diagnosis not present

## 2017-09-12 MED ORDER — AMOXICILLIN-POT CLAVULANATE 875-125 MG PO TABS: 1.0000 | ORAL_TABLET | Freq: Two times a day (BID) | ORAL | 0 refills | Status: DC

## 2017-09-12 NOTE — Progress Notes (Signed)

## 2017-10-19 DIAGNOSIS — M79672 Pain in left foot: Secondary | ICD-10-CM | POA: Diagnosis not present

## 2017-10-19 DIAGNOSIS — M67472 Ganglion, left ankle and foot: Secondary | ICD-10-CM | POA: Diagnosis not present

## 2017-11-12 MED FILL — HYDROCHLOROTHIAZIDE 25 MG T: 25 | 90 days supply | Qty: 90 | Fill #1

## 2017-11-12 MED FILL — DASETTA 7/7/7-28 TABLET: 0.5/0.75/1- | 84 days supply | Qty: 84 | Fill #3

## 2017-11-12 MED FILL — LEVOCETIRIZINE 5 MG TABLET: 5 | 90 days supply | Qty: 90 | Fill #1

## 2017-11-12 MED FILL — FEXOFENADINE HCL 180 MG TAB: 180 | 90 days supply | Qty: 90 | Fill #1

## 2017-11-12 MED FILL — AZELASTINE HCL 137 MCG/SPRA: 137 | 30 days supply | Qty: 30 | Fill #0

## 2017-11-12 MED FILL — PRAVASTATIN SODIUM 20 MG TA: 20 | 84 days supply | Qty: 24 | Fill #0

## 2018-02-01 DIAGNOSIS — H9042 Sensorineural hearing loss, unilateral, left ear, with unrestricted hearing on the contralateral side: Secondary | ICD-10-CM | POA: Diagnosis not present

## 2018-02-11 MED FILL — PRAVASTATIN SODIUM 20 MG TA: 20 | 84 days supply | Qty: 24 | Fill #1

## 2018-02-11 MED FILL — HYDROCHLOROTHIAZIDE 25 MG T: 25 | 90 days supply | Qty: 90 | Fill #2

## 2018-02-11 MED FILL — LEVOCETIRIZINE 5 MG TABLET: 5 | 30 days supply | Qty: 30 | Fill #0

## 2018-02-11 MED FILL — FEXOFENADINE HCL 180 MG TAB: 180 | 90 days supply | Qty: 90 | Fill #0

## 2018-02-11 MED FILL — AZELASTINE HCL 137 MCG/SPRA: 137 | 50 days supply | Qty: 30 | Fill #1

## 2018-02-11 MED FILL — DASETTA 7/7/7-28 TABLET: 0.5/0.75/1- | 84 days supply | Qty: 84 | Fill #4

## 2018-02-22 DIAGNOSIS — H5213 Myopia, bilateral: Secondary | ICD-10-CM | POA: Diagnosis not present

## 2018-02-22 DIAGNOSIS — E119 Type 2 diabetes mellitus without complications: Secondary | ICD-10-CM | POA: Diagnosis not present

## 2018-02-22 DIAGNOSIS — H1789 Other corneal scars and opacities: Secondary | ICD-10-CM | POA: Diagnosis not present

## 2018-02-23 DIAGNOSIS — Z6838 Body mass index (BMI) 38.0-38.9, adult: Secondary | ICD-10-CM | POA: Diagnosis not present

## 2018-02-23 DIAGNOSIS — Z7984 Long term (current) use of oral hypoglycemic drugs: Secondary | ICD-10-CM | POA: Diagnosis not present

## 2018-02-23 DIAGNOSIS — E1169 Type 2 diabetes mellitus with other specified complication: Secondary | ICD-10-CM | POA: Diagnosis not present

## 2018-02-23 DIAGNOSIS — E7800 Pure hypercholesterolemia, unspecified: Secondary | ICD-10-CM | POA: Diagnosis not present

## 2018-03-02 MED FILL — metFORMIN HCL ER 500 MG TB2: 500 | 30 days supply | Qty: 30 | Fill #0

## 2018-03-15 MED FILL — BYDUREON BCise 2 MG/0.85ML: 2 | 28 days supply | Qty: 3 | Fill #0

## 2018-03-25 MED FILL — LEVOCETIRIZINE 5 MG TABLET: 5 | 30 days supply | Qty: 30 | Fill #0

## 2018-04-05 DIAGNOSIS — Z793 Long term (current) use of hormonal contraceptives: Secondary | ICD-10-CM | POA: Diagnosis not present

## 2018-04-05 DIAGNOSIS — Z1389 Encounter for screening for other disorder: Secondary | ICD-10-CM | POA: Diagnosis not present

## 2018-04-05 DIAGNOSIS — Z1231 Encounter for screening mammogram for malignant neoplasm of breast: Secondary | ICD-10-CM | POA: Diagnosis not present

## 2018-04-05 DIAGNOSIS — Z01419 Encounter for gynecological examination (general) (routine) without abnormal findings: Secondary | ICD-10-CM | POA: Diagnosis not present

## 2018-04-05 DIAGNOSIS — Z130 Encounter for screening for diseases of the blood and blood-forming organs and certain disorders involving the immune mechanism: Secondary | ICD-10-CM | POA: Diagnosis not present

## 2018-04-05 DIAGNOSIS — Z6841 Body Mass Index (BMI) 40.0 and over, adult: Secondary | ICD-10-CM | POA: Diagnosis not present

## 2018-04-05 DIAGNOSIS — N898 Other specified noninflammatory disorders of vagina: Secondary | ICD-10-CM | POA: Diagnosis not present

## 2018-04-13 ENCOUNTER — Other Ambulatory Visit: Payer: Self-pay | Admitting: Obstetrics and Gynecology

## 2018-04-13 DIAGNOSIS — R928 Other abnormal and inconclusive findings on diagnostic imaging of breast: Secondary | ICD-10-CM

## 2018-04-19 ENCOUNTER — Ambulatory Visit
Admission: RE | Admit: 2018-04-19 | Discharge: 2018-04-19 | Disposition: A | Discharge status: Home / Self Care | Payer: 59 | Source: Ambulatory Visit | Attending: Obstetrics and Gynecology | Admitting: Obstetrics and Gynecology

## 2018-04-19 DIAGNOSIS — N6312 Unspecified lump in the right breast, upper inner quadrant: Secondary | ICD-10-CM | POA: Diagnosis not present

## 2018-04-19 DIAGNOSIS — N6314 Unspecified lump in the right breast, lower inner quadrant: Secondary | ICD-10-CM | POA: Diagnosis not present

## 2018-04-19 DIAGNOSIS — R928 Other abnormal and inconclusive findings on diagnostic imaging of breast: Secondary | ICD-10-CM

## 2018-04-19 IMAGING — US US BREAST*R* LIMITED INC AXILLA
1 series · 13 of 13 positions shown · non-contrast
Comparison: Previous exam(s).

CLINICAL DATA: Patient recalled from screening for right breast
mass.

EXAM:
ULTRASOUND OF THE RIGHT BREAST

[Series 1: us breast*right* limited inc axilla · 0.06mm/px · 13 of 13 slices shown]
[im 1/13]
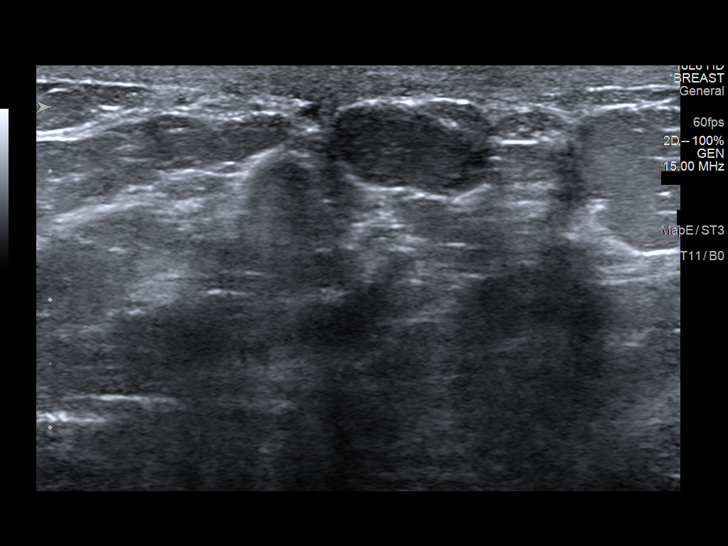
[im 2/13]
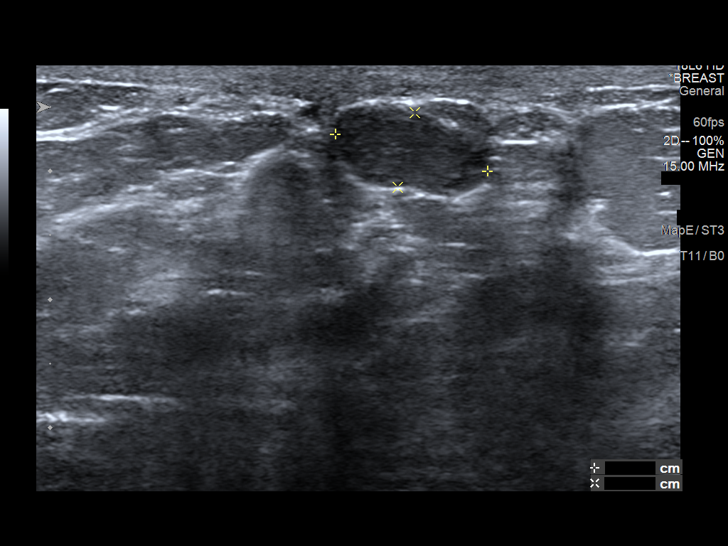
[im 3/13]
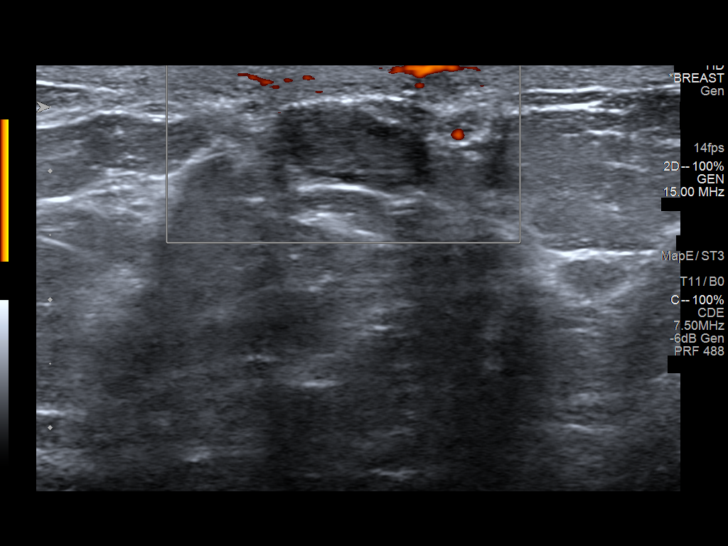
[im 4/13]
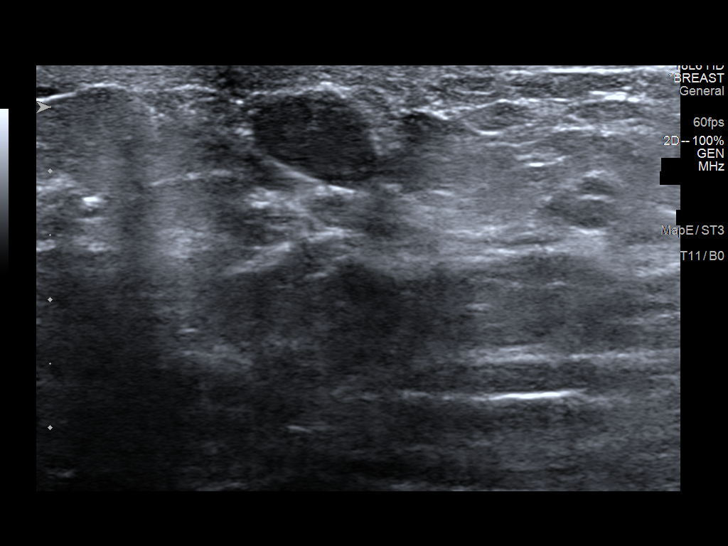
[im 5/13]
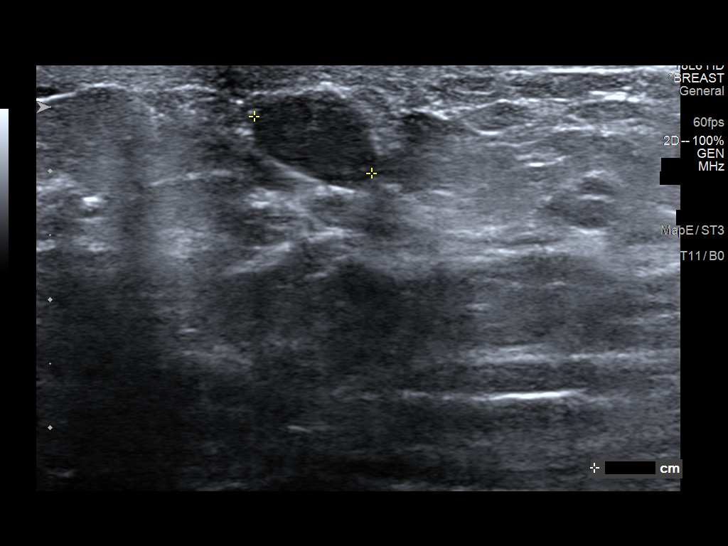
[im 6/13]
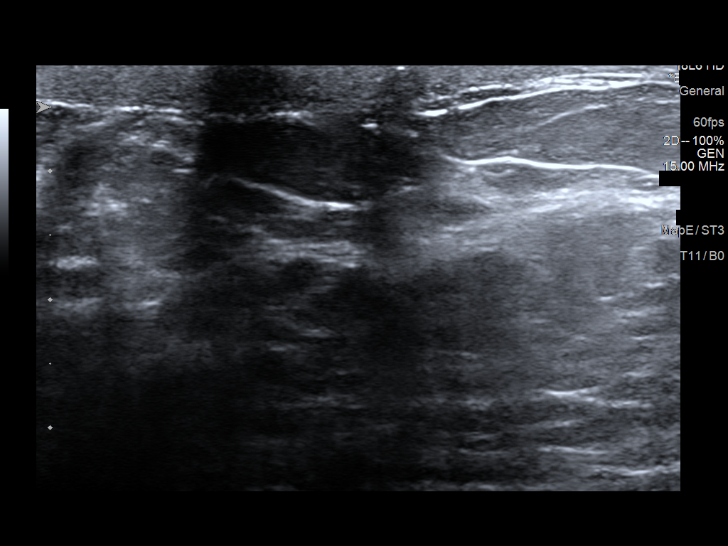
[im 7/13]
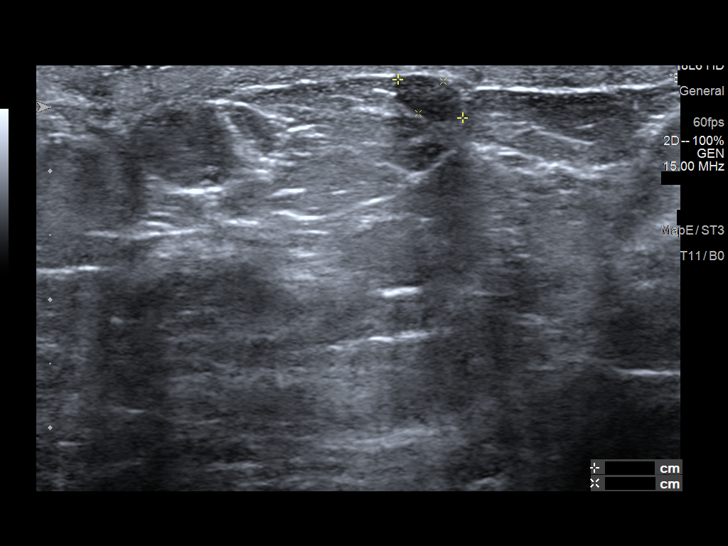
[im 8/13]
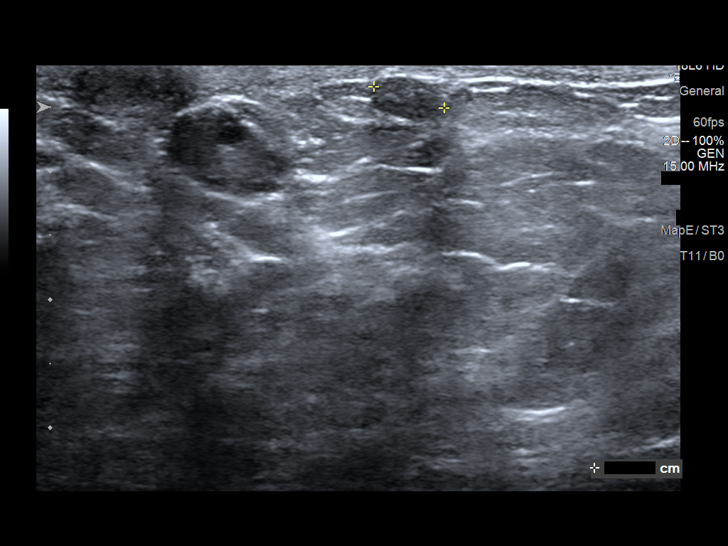
[im 9/13]
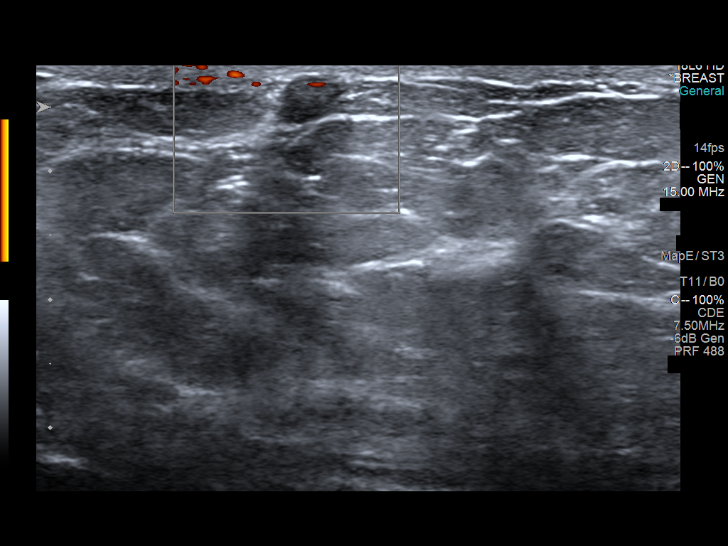
[im 10/13]
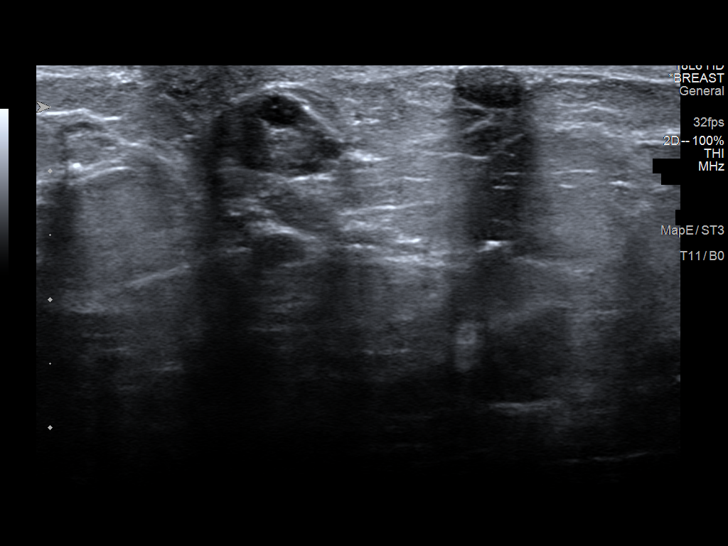
[im 11/13]
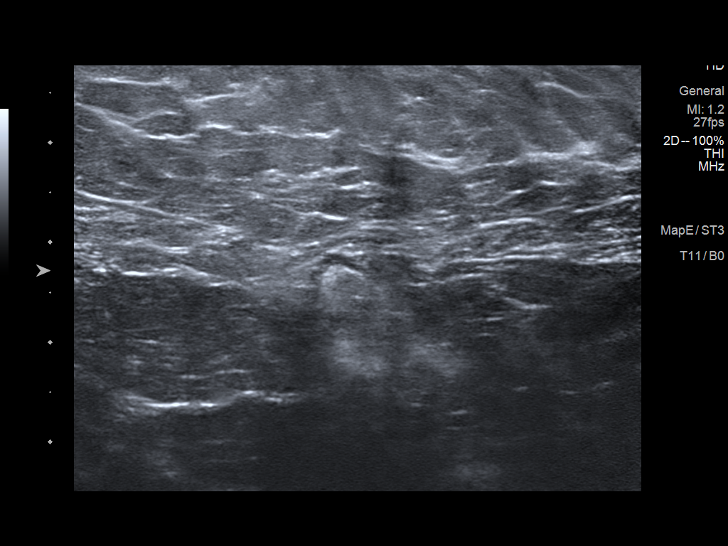
[im 12/13]
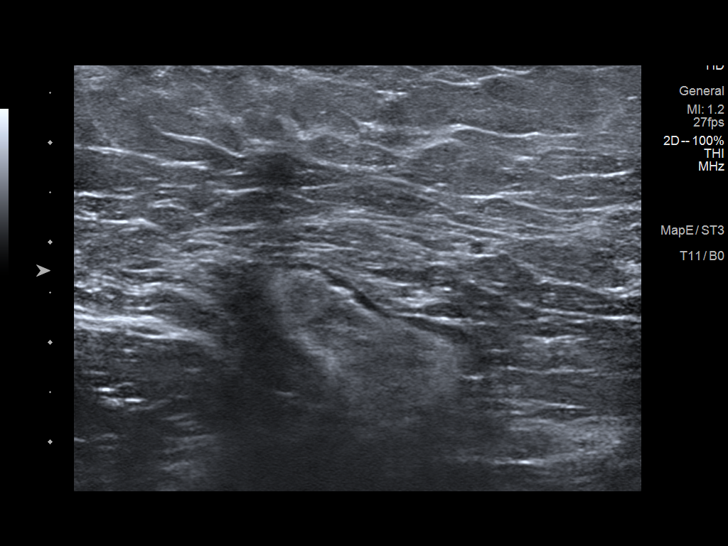
[im 13/13]
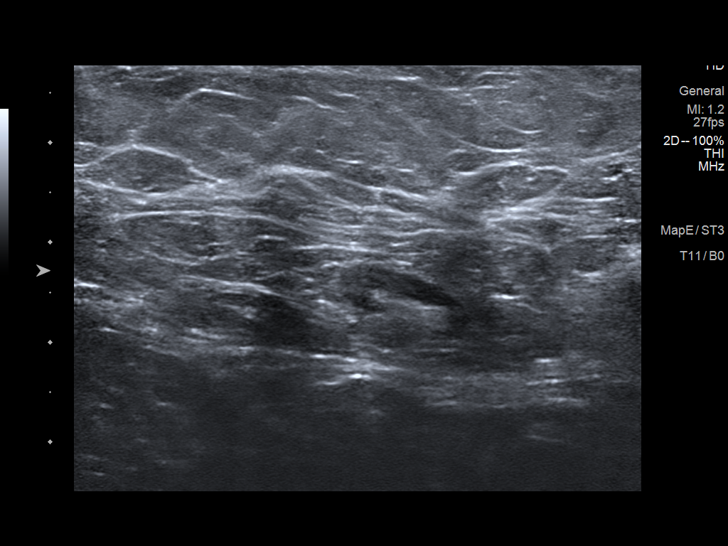

[13 of 13 positions shown; findings below may reference images not displayed]

FINDINGS: Targeted ultrasound is performed, showing a 1.2 x 0.6 x 1.0 cm oval
circumscribed hypoechoic mass right breast 3 o'clock position
retroareolar location.

Additionally there is a 0.6 x 0.3 x 0.6 cm oval circumscribed
hypoechoic mass right breast 4 o'clock position retroareolar
location.

No right axillary adenopathy.
IMPRESSION: Two indeterminate right breast masses at the 3 o'clock and 4 o'clock
position as described above.

RECOMMENDATION:
Ultrasound-guided core needle biopsy right breast mass 3 o'clock
position retroareolar location.

Ultrasound-guided core needle biopsy right breast mass 4 o'clock
position retroareolar location.

I have discussed the findings and recommendations with the patient.
Results were also provided in writing at the conclusion of the
visit. If applicable, a reminder letter will be sent to the patient
regarding the next appointment.

BI-RADS CATEGORY  4: Suspicious.

## 2018-04-20 ENCOUNTER — Other Ambulatory Visit: Payer: Self-pay | Admitting: Obstetrics and Gynecology

## 2018-04-20 DIAGNOSIS — N6310 Unspecified lump in the right breast, unspecified quadrant: Secondary | ICD-10-CM

## 2018-04-21 ENCOUNTER — Encounter (HOSPITAL_COMMUNITY): Payer: Self-pay

## 2018-04-21 ENCOUNTER — Ambulatory Visit (HOSPITAL_COMMUNITY)
Admission: EM | Admit: 2018-04-21 | Discharge: 2018-04-21 | Disposition: A | Discharge status: Home / Self Care | Payer: 59 | Attending: Family Medicine | Admitting: Family Medicine

## 2018-04-21 ENCOUNTER — Other Ambulatory Visit: Payer: Self-pay

## 2018-04-21 DIAGNOSIS — J40 Bronchitis, not specified as acute or chronic: Secondary | ICD-10-CM | POA: Diagnosis not present

## 2018-04-21 DIAGNOSIS — J0100 Acute maxillary sinusitis, unspecified: Secondary | ICD-10-CM | POA: Diagnosis not present

## 2018-04-21 MED ORDER — AMOXICILLIN-POT CLAVULANATE 875-125 MG PO TABS: 1.0000 | ORAL_TABLET | Freq: Two times a day (BID) | ORAL | 0 refills | Status: DC

## 2018-04-21 MED ORDER — BENZONATATE 100 MG PO CAPS: 100.0000 mg | ORAL_CAPSULE | Freq: Three times a day (TID) | ORAL | 0 refills | Status: DC | PRN

## 2018-04-21 MED FILL — AMOX-CLAV 875-125 MG TABLET: 875-125 | 7 days supply | Qty: 14 | Fill #0

## 2018-04-21 MED FILL — BENZONATATE 100 MG CAPS: 100 | 7 days supply | Qty: 40 | Fill #0

## 2018-04-21 MED FILL — DASETTA 7/7/7-28 TABLET: 0.5/0.75/1- | 84 days supply | Qty: 84 | Fill #0

## 2018-04-21 MED FILL — LEVOCETIRIZINE 5 MG TABLET: 5 | 30 days supply | Qty: 30 | Fill #1

## 2018-04-21 MED FILL — BYDUREON BCise 2 MG/0.85ML: 2 | 28 days supply | Qty: 3 | Fill #1

## 2018-04-21 NOTE — ED Provider Notes (Signed)
[facility]    CSN: [id number] Arrival date & time: 04/21/18  1253     History   Chief Complaint Chief Complaint  Patient presents with  . Cough    HPI Elizabeth Cummings is a 43 y.o. female.   This is the initial visit for this 43 year old woman.  She is complaining about cough, congestion, headache and she has had the flu but no antibiotics. But she has a cough, congestion and headache, onset was 2 weeks ago.  Is feeling facial pressure.  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     Past Medical History:  Diagnosis Date  . Allergy    seasonal, dogs, cats  . 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 Leukocytosis 05/08/2014  . Microcytic anemia 05/08/2014  . Thrombocytosis ([place]) 05/08/2014    Patient Active Problem List   Diagnosis Date Noted  . Prediabetes 03/02/2016  . Microcytic anemia 05/08/2014  . Leukocytosis 05/08/2014  . Thrombocytosis ([place]) 05/08/2014  . 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 APPENDECTOMY    . CESAREAN SECTION    . CHOLECYSTECTOMY    . WRIST SURGERY      OB History   No obstetric history on file.      Home Medications    Prior to Admission medications   Medication Sig Start Date End Date Taking? Authorizing Provider  amoxicillin-clavulanate (AUGMENTIN) 875-125 MG tablet Take 1 tablet by mouth 2 (two) times daily. 04/21/18   Robyn Haber, MD  benzonatate (TESSALON) 100 MG capsule Take 1-2 capsules (100-200 mg total) by mouth 3 (three) times daily as needed for cough. 04/21/18   Robyn Haber, MD  calcium-vitamin D (OSCAL WITH D) 500-200 MG-UNIT per tablet Take 1 tablet by mouth daily.    [provider]  flintstones complete (FLINTSTONES) 60 MG chewable tablet Chew 1 tablet by mouth daily.    [provider]  fluocinolone (VANOS) 0.01 % cream Apply topically 2 (two) times daily.    [provider]  hydrochlorothiazide  (HYDRODIURIL) 25 MG tablet Take 25 mg by mouth daily.    [provider]  levocetirizine (XYZAL) 5 MG tablet Take 5 mg by mouth every evening.    [provider]  norethindrone-ethinyl estradiol (CYCLAFEM,ALYACEN) 0.5/0.75/1-35 MG-MCG tablet Take 1 tablet by mouth daily.    [provider]    Family History Family History  Problem Relation Age of Onset  . Hypertension Other   . Hyperlipidemia Other   . Cancer Other   . Stroke Other   . Diabetes Other     Social History Social History   Tobacco Use  . Smoking status: Never Smoker  . Smokeless tobacco: Never Used  Substance Use Topics  . Alcohol use: No    Alcohol/week: 0.0 standard drinks  . Drug use: No     Allergies   Dilaudid [hydromorphone hcl]   Review of Systems Review of Systems   Physical Exam Triage Vital Signs ED Triage Vitals [04/21/18 1333]  Enc Vitals Group     BP      Pulse      Resp      Temp      Temp src      SpO2      Weight 225 lb (102.1 kg)     Height      Head Circumference      Peak Flow      Pain  Score 2     Pain Loc      Pain Edu?      Excl. in [place]?    No data found.  Updated Vital Signs BP 127/85 (BP Location: Right Arm)   Pulse (!) 101   Temp 98.3 F (36.8 C) (Oral)   Resp 18   Wt 102.1 kg   LMP 04/07/2018   SpO2 97%   BMI 38.62 kg/m    Physical Exam Vitals signs and nursing note reviewed.  Constitutional:      Appearance: Normal appearance. She is obese. She is not ill-appearing or diaphoretic.  HENT:     Right Ear: Tympanic membrane and external ear normal.     Left Ear: External ear normal.     Nose: Congestion present.     Mouth/Throat:     Mouth: Mucous membranes are moist.  Eyes:     Conjunctiva/sclera: Conjunctivae normal.  Neck:     Musculoskeletal: Normal range of motion and neck supple.  Cardiovascular:     Rate and Rhythm: Normal rate and regular rhythm.     Heart sounds: Normal heart sounds.  Pulmonary:     Effort:  Pulmonary effort is normal.     Breath sounds: Normal breath sounds.  Musculoskeletal: Normal range of motion.  Skin:    General: Skin is warm and dry.  Neurological:     General: No focal deficit present.     Mental Status: She is alert.  Psychiatric:        Mood and Affect: Mood normal.      UC Treatments / Results  Labs (all labs ordered are listed, but only abnormal results are displayed) Labs Reviewed - No data to display  EKG None  Radiology US Breast Ltd Uni Right Inc Axilla  Result Date: 04/19/2018 CLINICAL DATA:  Patient recalled from screening for right breast mass. EXAM: ULTRASOUND OF THE RIGHT BREAST COMPARISON:  Previous exam(s). FINDINGS: Targeted ultrasound is performed, showing a 1.2 x 0.6 x 1.0 cm oval circumscribed hypoechoic mass right breast 3 o'clock position retroareolar location. Additionally there is a 0.6 x 0.3 x 0.6 cm oval circumscribed hypoechoic mass right breast 4 o'clock position retroareolar location. No right axillary adenopathy. IMPRESSION: Two indeterminate right breast masses at the 3 o'clock and 4 o'clock position as described above. RECOMMENDATION: Ultrasound-guided core needle biopsy right breast mass 3 o'clock position retroareolar location. Ultrasound-guided core needle biopsy right breast mass 4 o'clock position retroareolar location. I have discussed the findings and recommendations with the patient. Results were also provided in writing at the conclusion of the visit. If applicable, a reminder letter will be sent to the patient regarding the next appointment. BI-RADS CATEGORY  4: Suspicious. Electronically Signed   By: Lovey Newcomer M.D.   On: 04/19/2018 16:01    Procedures Procedures (including critical care time)  Medications Ordered in UC Medications - No data to display  Initial Impression / Assessment and Plan / UC Course  I have reviewed the triage vital signs and the nursing notes.  Pertinent labs & imaging results that were  available during my care of the patient were reviewed by me and considered in my medical decision making (see chart for details).     Final Clinical Impressions(s) / UC Diagnoses   Final diagnoses:  Bronchitis  Acute non-recurrent maxillary sinusitis   Discharge Instructions   None    ED Prescriptions    Medication Sig Dispense Auth. Provider   amoxicillin-clavulanate (AUGMENTIN) 875-125 MG tablet  Take 1 tablet by mouth 2 (two) times daily. 14 tablet Robyn Haber, MD   benzonatate (TESSALON) 100 MG capsule Take 1-2 capsules (100-200 mg total) by mouth 3 (three) times daily as needed for cough. 40 capsule Robyn Haber, MD     Controlled Substance Prescriptions [place] Controlled Substance Registry consulted? Not Applicable   Robyn Haber, MD 04/21/18 1349

## 2018-04-21 NOTE — ED Triage Notes (Signed)
Pt cc cough , congestion , headache and she has had the flu and the antibiotics. But she has a cough, congestion and headache. 2 weeks.

## 2018-04-22 ENCOUNTER — Ambulatory Visit
Admission: RE | Admit: 2018-04-22 | Discharge: 2018-04-22 | Disposition: A | Discharge status: Home / Self Care | Payer: 59 | Source: Ambulatory Visit | Attending: Obstetrics and Gynecology | Admitting: Obstetrics and Gynecology

## 2018-04-22 DIAGNOSIS — N6310 Unspecified lump in the right breast, unspecified quadrant: Secondary | ICD-10-CM

## 2018-04-22 DIAGNOSIS — N6314 Unspecified lump in the right breast, lower inner quadrant: Secondary | ICD-10-CM | POA: Diagnosis not present

## 2018-04-22 DIAGNOSIS — N62 Hypertrophy of breast: Secondary | ICD-10-CM | POA: Diagnosis not present

## 2018-04-22 IMAGING — US US BREAST BX W LOC DEV 1ST LESION IMG BX SPEC US GUIDE*R*
1 series · 13 of 14 positions shown · non-contrast
Comparison: Previous exam(s).

Addendum:
CLINICAL DATA: Ultrasound-guided core needle biopsy was recommended
of a retroareolar right breast mass at 3 o'clock position.

EXAM:
ULTRASOUND GUIDED RIGHT BREAST CORE NEEDLE BIOPSY

[Series 1: us breast bx w loc dev 1st lesion img bx spec us g · 0.05mm/px · 13 of 14 slices shown]
[im 1/14]
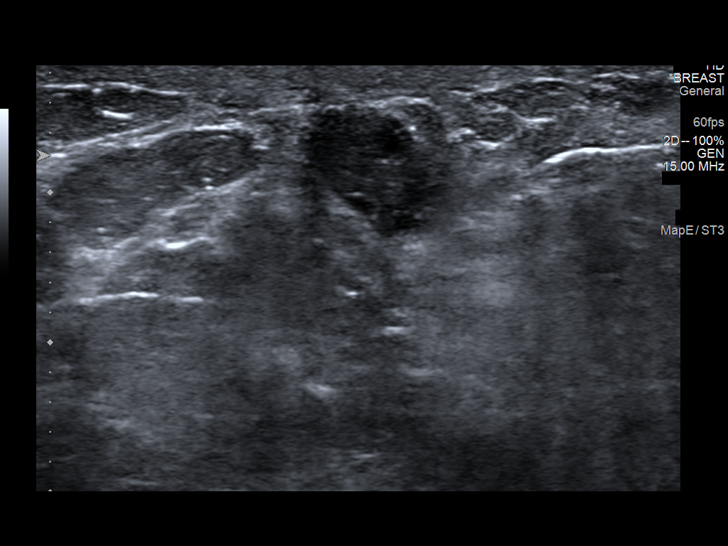
[im 2/14]
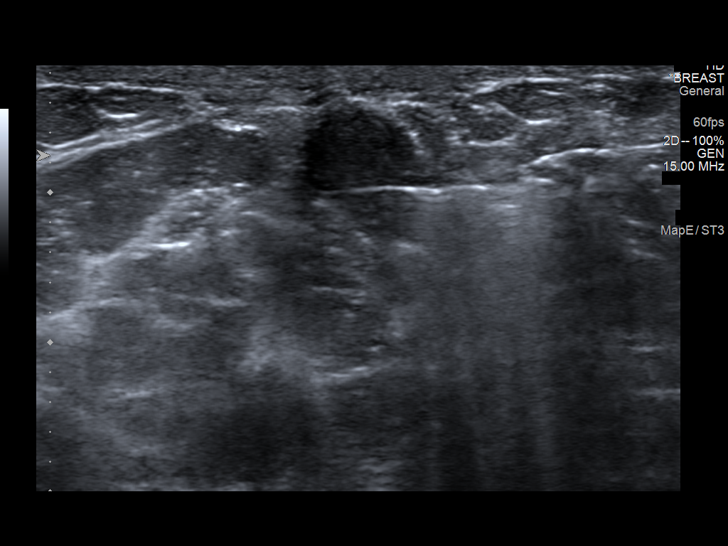
[im 3/14]
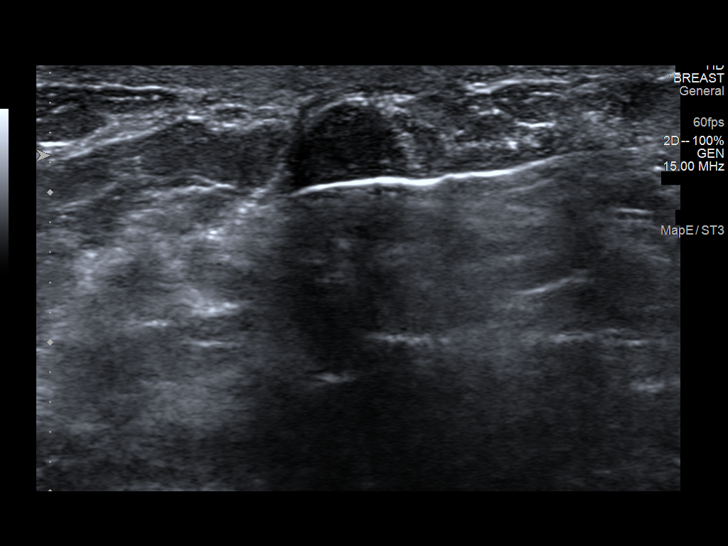
[im 4/14]
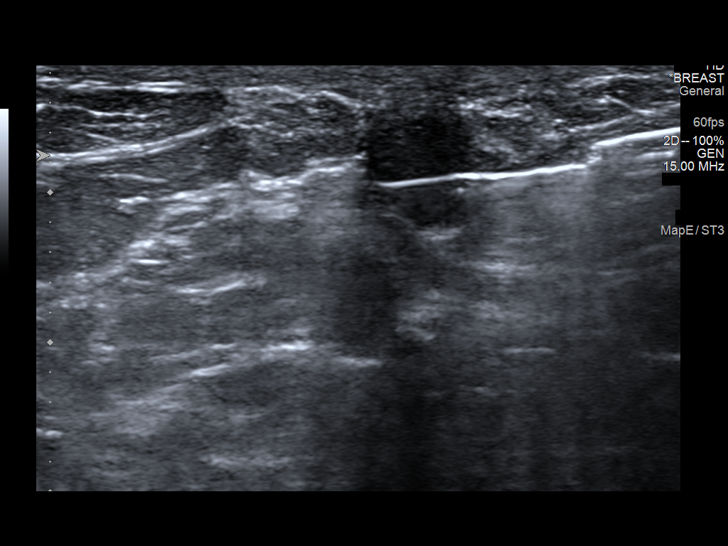
[im 5/14]
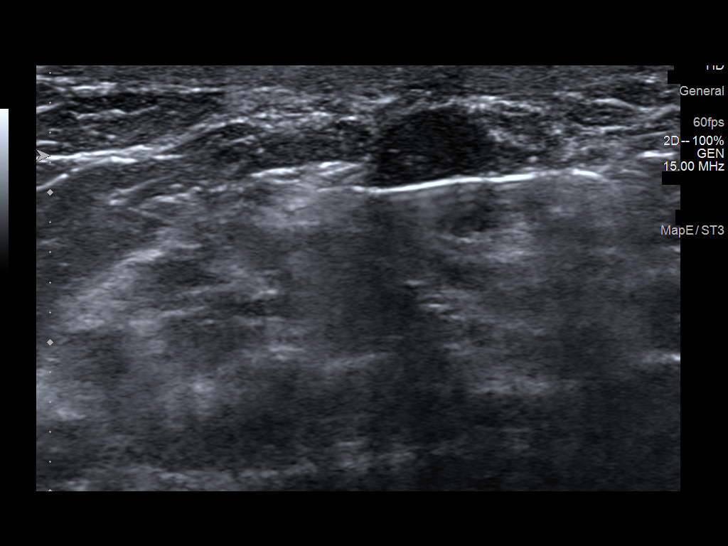
[im 6/14]
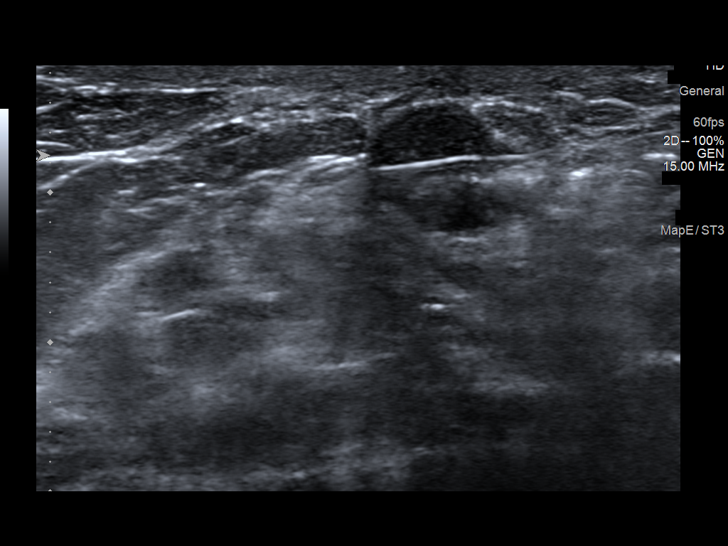
[im 8/14]
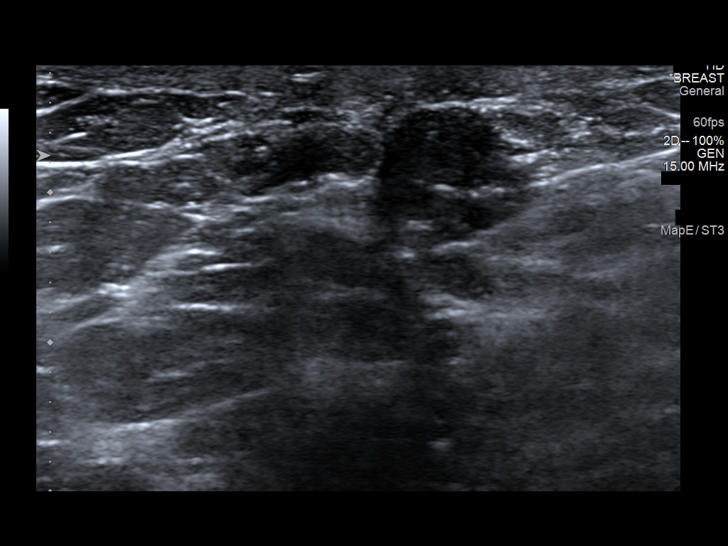
[im 9/14]
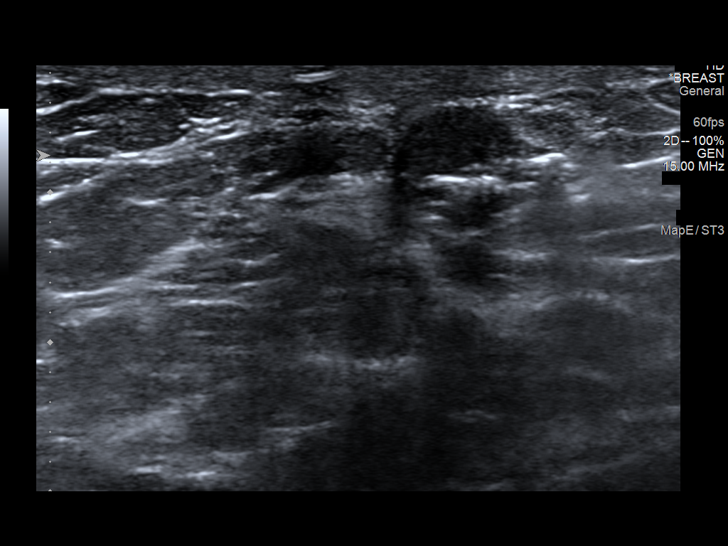
[im 10/14]
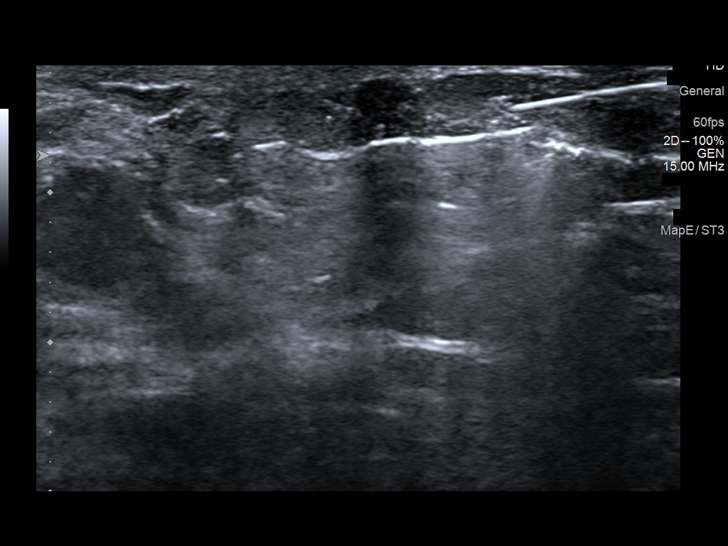
[im 11/14]
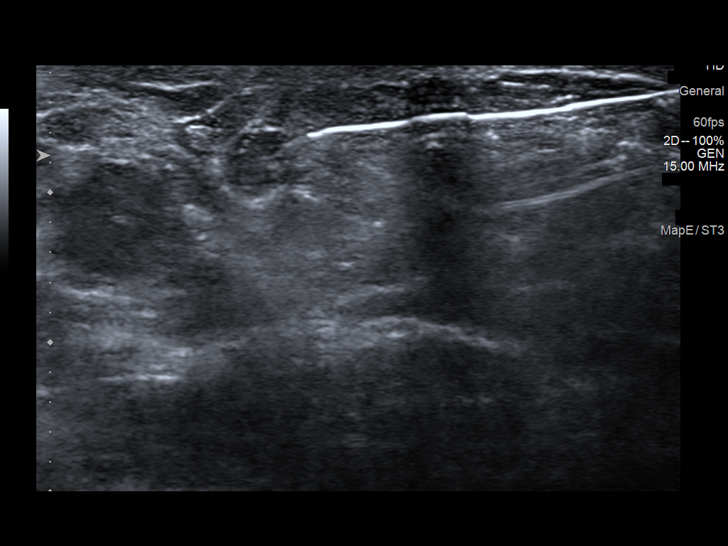
[im 12/14]
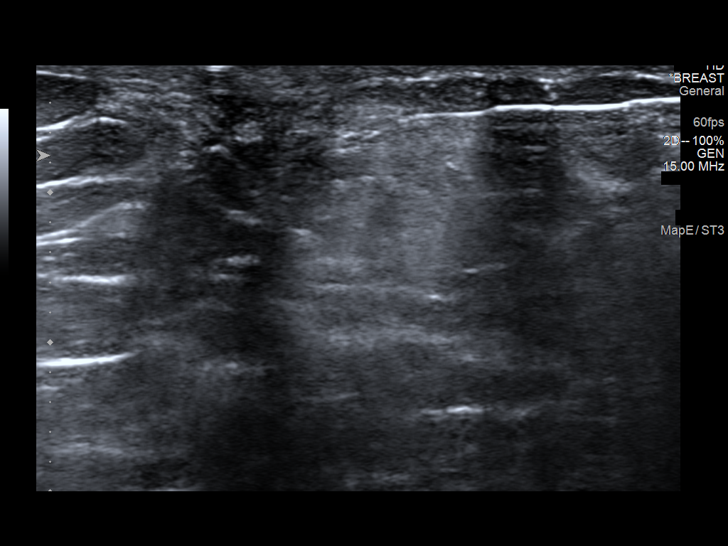
[im 13/14]
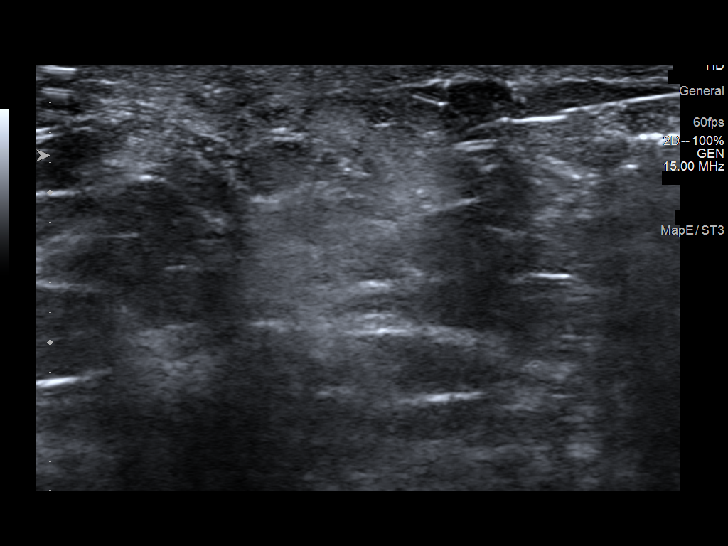
[im 14/14]
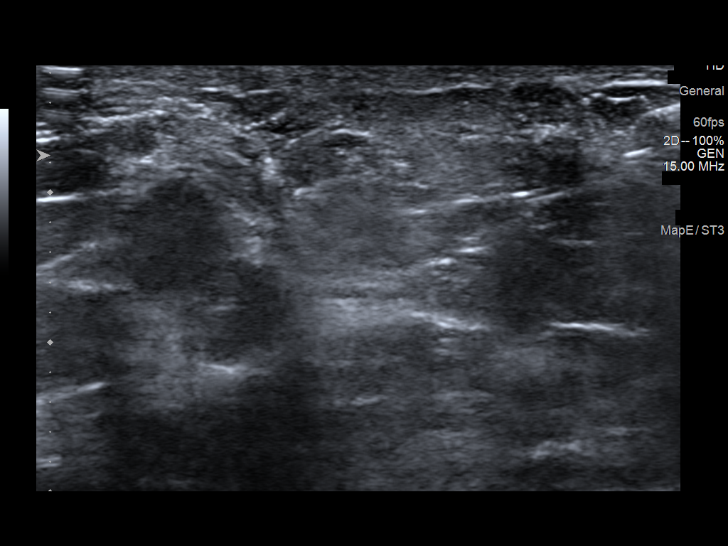

[13 of 14 positions shown; findings below may reference images not displayed]



Lesion quadrant: Lower inner quadrant

Using sterile technique and 1% Lidocaine as local anesthetic, under
direct ultrasound visualization, a 14 gauge BARONAITE device was
used to perform biopsy of a mass in the 3 o'clock retroareolar right
breast using an inferior approach. At the conclusion of the
procedure a ribbon tissue marker clip was deployed into the biopsy
cavity. Follow up 2 view mammogram was performed and dictated
separately.
IMPRESSION: Ultrasound guided biopsy of right breast mass at 3 o'clock
retroareolar. No apparent complications.

ADDENDUM:
Pathology revealed PSEUDOANGIOMATOUS STROMAL HYPERPLASIA (PASH),
RIGHT breast, both locations, 3 o'clock and 4 o'clock. This was
found to be concordant by Dr. BARONAITE.

Pathology results were discussed with the patient by telephone. The
patient reported doing well after the biopsies with tenderness at
the sites. Post biopsy instructions and care were reviewed and
questions were answered. The patient was encouraged to call The

The patient was instructed to return for annual screening
mammography at [HOSPITAL[facility].

Pathology results reported by BARONAITE, RN on [DATE].

*** End of Addendum ***

## 2018-04-22 IMAGING — MG MM CLIP PLACEMENT
2 series · 2 of 2 positions shown · non-contrast
Comparison: Previous exam(s).

CLINICAL DATA: Two right breast masses were biopsied under
ultrasound guidance.

EXAM:
DIAGNOSTIC RIGHT MAMMOGRAM POST ULTRASOUND BIOPSY

[R CC]
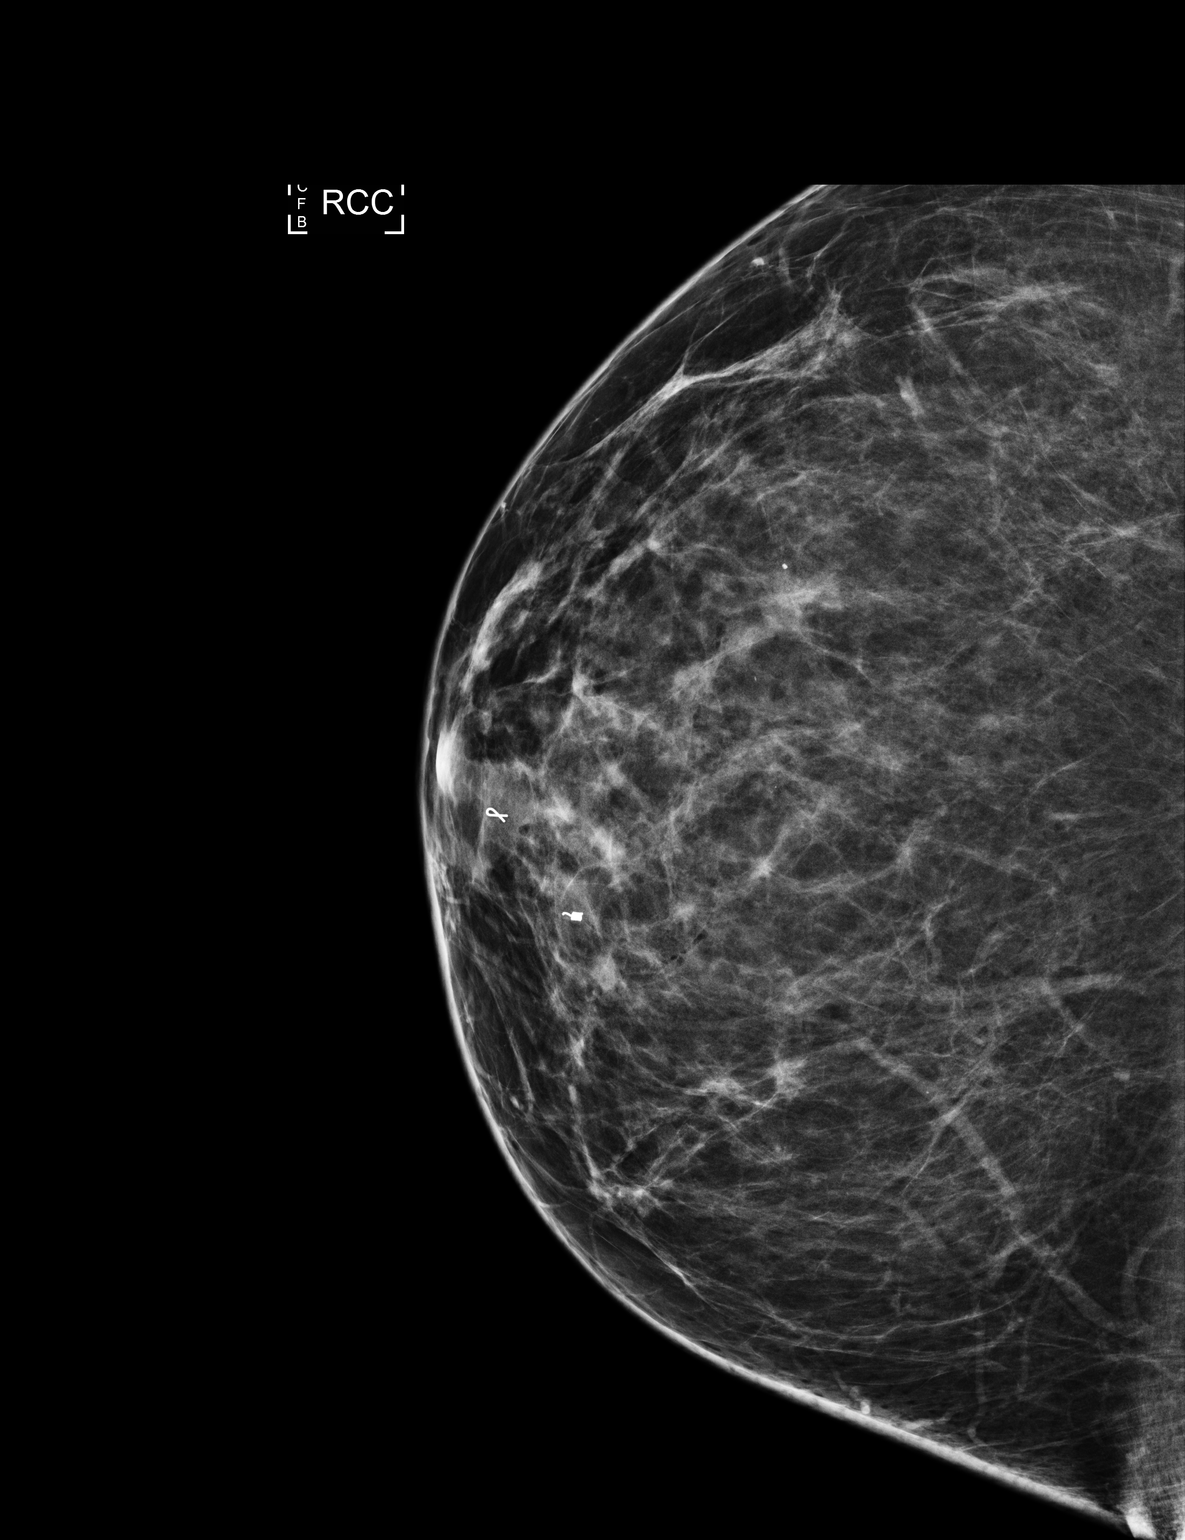

[R ML]
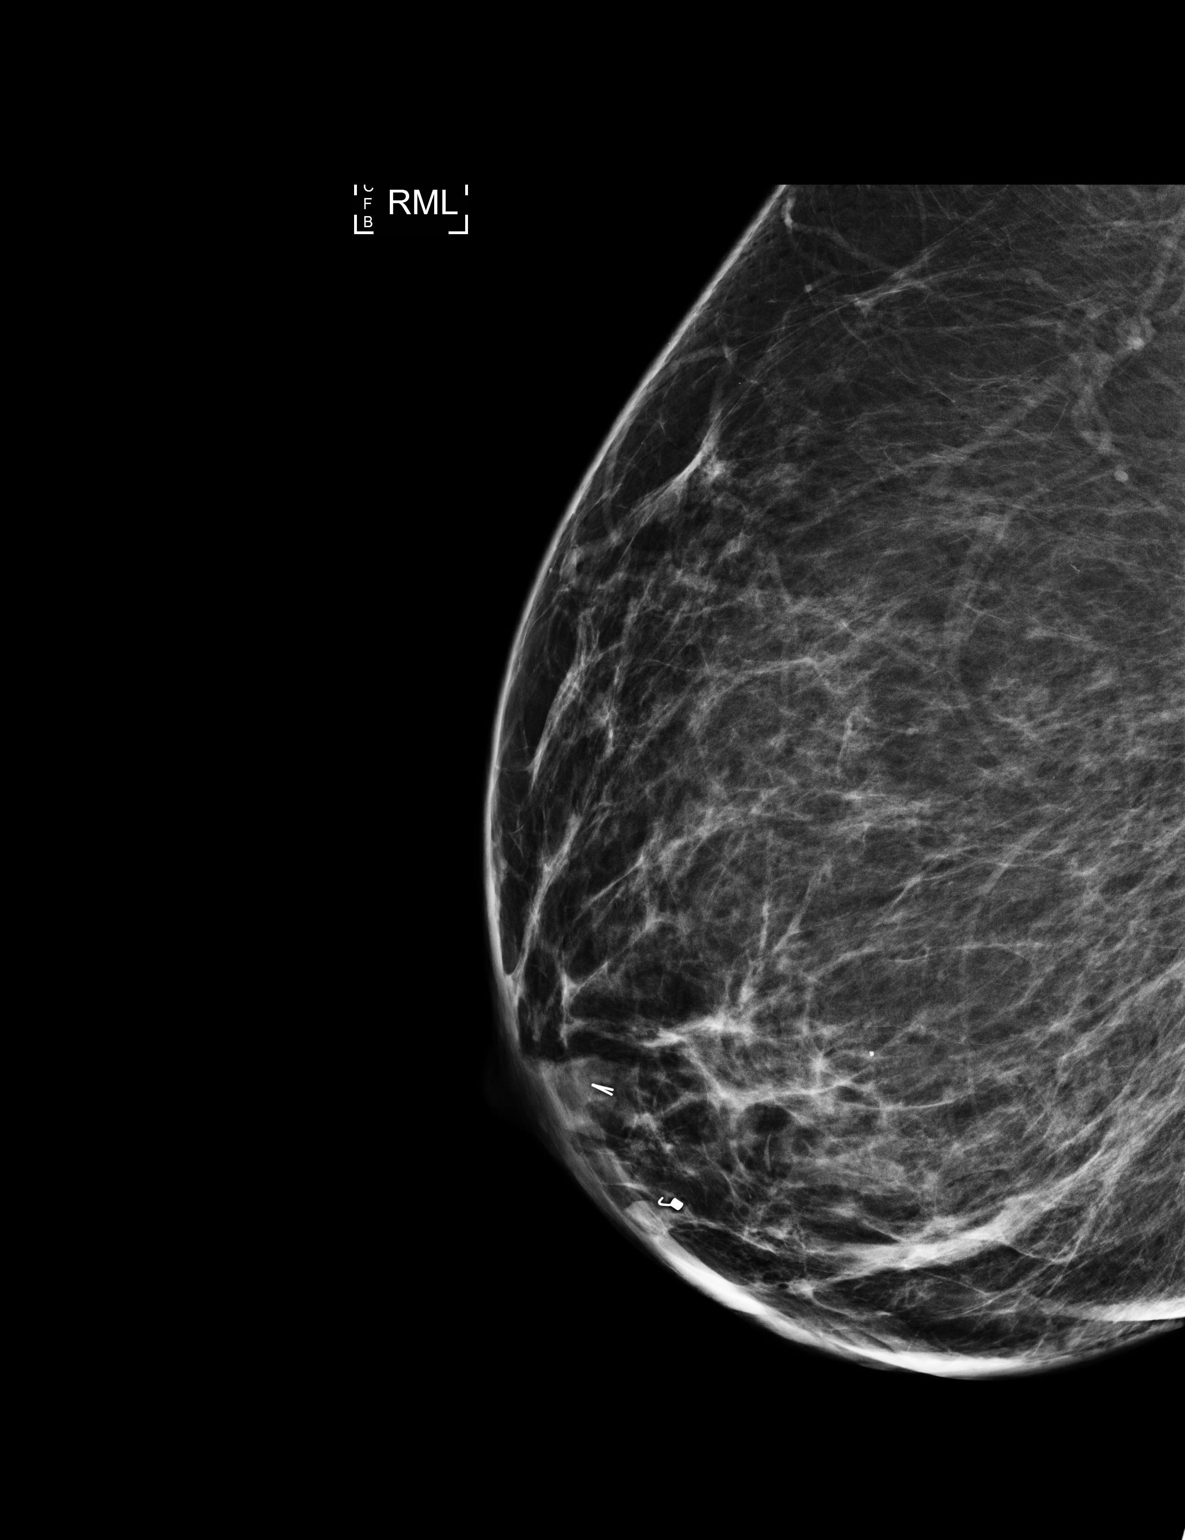

[2 of 2 positions shown; findings below may reference images not displayed]

FINDINGS: Mammographic images were obtained following ultrasound guided biopsy
of 2 retroareolar right breast masses, at 3 o'clock and 4 o'clock
positions. A ribbon shaped biopsy clip is satisfactorily positioned
in the 3 o'clock retroareolar right breast mass. Coil shaped biopsy
clip is satisfactorily positioned in the superficial mass in the 4
o'clock position retroareolar right breast.
IMPRESSION: Satisfactory position of ribbon and coil shaped biopsy clips.

Final Assessment: Post Procedure Mammograms for Marker Placement

## 2018-05-16 MED FILL — LEVOCETIRIZINE 5 MG TABLET: 5 | 30 days supply | Qty: 30 | Fill #2

## 2018-05-16 MED FILL — BYDUREON BCise 2 MG/0.85ML: 2 | 28 days supply | Qty: 3 | Fill #2 | Status: TO

## 2018-05-16 MED FILL — HYDROCHLOROTHIAZIDE 25 MG T: 25 | 90 days supply | Qty: 90 | Fill #3

## 2018-05-30 DIAGNOSIS — E1169 Type 2 diabetes mellitus with other specified complication: Secondary | ICD-10-CM | POA: Diagnosis not present

## 2018-06-13 MED FILL — LEVOCETIRIZINE 5 MG TABLET: 5 | 30 days supply | Qty: 30 | Fill #0

## 2018-06-13 MED FILL — BYDUREON BCise 2 MG/0.85ML: 2 | 28 days supply | Qty: 3 | Fill #0

## 2018-07-18 MED FILL — LEVOCETIRIZINE DIHYDROCHLOR: 5 | 30 days supply | Qty: 30 | Fill #1 | Status: TO

## 2018-07-18 MED FILL — BYDUREON BCise 2 MG/0.85ML: 2 | 28 days supply | Qty: 3 | Fill #1 | Status: TO

## 2018-07-18 MED FILL — DASETTA 7/7/7-28 TABLET: 0.5/0.75/1- | 84 days supply | Qty: 84 | Fill #0 | Status: TO

## 2018-08-11 DIAGNOSIS — J301 Allergic rhinitis due to pollen: Secondary | ICD-10-CM | POA: Diagnosis not present

## 2018-08-11 DIAGNOSIS — L503 Dermatographic urticaria: Secondary | ICD-10-CM | POA: Diagnosis not present

## 2018-08-11 DIAGNOSIS — J3089 Other allergic rhinitis: Secondary | ICD-10-CM | POA: Diagnosis not present

## 2018-08-11 DIAGNOSIS — J3081 Allergic rhinitis due to animal (cat) (dog) hair and dander: Secondary | ICD-10-CM | POA: Diagnosis not present

## 2018-08-13 MED FILL — FEXOFENADINE HCL 180 MG TAB: 180 | 90 days supply | Qty: 90 | Fill #0

## 2018-08-13 MED FILL — LEVOCETIRIZINE DIHYDROCHLOR: 5 | 90 days supply | Qty: 90 | Fill #0

## 2018-08-16 MED FILL — BYDUREON BCise 2 MG/0.85ML: 2 | 28 days supply | Qty: 3 | Fill #0

## 2018-08-17 DIAGNOSIS — Z03818 Encounter for observation for suspected exposure to other biological agents ruled out: Secondary | ICD-10-CM | POA: Diagnosis not present

## 2018-08-17 MED FILL — AZELASTINE HCL 137 MCG SPRY: 0.1 | 30 days supply | Qty: 30 | Fill #0

## 2018-08-22 MED FILL — HYDROCHLOROTHIAZIDE 25 MG T: 25 | 90 days supply | Qty: 90 | Fill #0

## 2018-09-01 DIAGNOSIS — D473 Essential (hemorrhagic) thrombocythemia: Secondary | ICD-10-CM | POA: Diagnosis not present

## 2018-09-01 DIAGNOSIS — E7800 Pure hypercholesterolemia, unspecified: Secondary | ICD-10-CM | POA: Diagnosis not present

## 2018-09-01 DIAGNOSIS — E1169 Type 2 diabetes mellitus with other specified complication: Secondary | ICD-10-CM | POA: Diagnosis not present

## 2018-09-05 DIAGNOSIS — L501 Idiopathic urticaria: Secondary | ICD-10-CM | POA: Diagnosis not present

## 2018-09-05 DIAGNOSIS — E7800 Pure hypercholesterolemia, unspecified: Secondary | ICD-10-CM | POA: Diagnosis not present

## 2018-09-05 DIAGNOSIS — Z0000 Encounter for general adult medical examination without abnormal findings: Secondary | ICD-10-CM | POA: Diagnosis not present

## 2018-09-05 DIAGNOSIS — E1169 Type 2 diabetes mellitus with other specified complication: Secondary | ICD-10-CM | POA: Diagnosis not present

## 2018-09-05 DIAGNOSIS — D72829 Elevated white blood cell count, unspecified: Secondary | ICD-10-CM | POA: Diagnosis not present

## 2018-09-05 DIAGNOSIS — Z7189 Other specified counseling: Secondary | ICD-10-CM | POA: Diagnosis not present

## 2018-09-05 DIAGNOSIS — D473 Essential (hemorrhagic) thrombocythemia: Secondary | ICD-10-CM | POA: Diagnosis not present

## 2018-09-05 DIAGNOSIS — Z6838 Body mass index (BMI) 38.0-38.9, adult: Secondary | ICD-10-CM | POA: Diagnosis not present

## 2018-09-29 DIAGNOSIS — L814 Other melanin hyperpigmentation: Secondary | ICD-10-CM | POA: Diagnosis not present

## 2018-09-29 DIAGNOSIS — D2271 Melanocytic nevi of right lower limb, including hip: Secondary | ICD-10-CM | POA: Diagnosis not present

## 2018-09-29 DIAGNOSIS — D2262 Melanocytic nevi of left upper limb, including shoulder: Secondary | ICD-10-CM | POA: Diagnosis not present

## 2018-09-29 DIAGNOSIS — L821 Other seborrheic keratosis: Secondary | ICD-10-CM | POA: Diagnosis not present

## 2018-09-29 DIAGNOSIS — L718 Other rosacea: Secondary | ICD-10-CM | POA: Diagnosis not present

## 2018-09-29 DIAGNOSIS — D2272 Melanocytic nevi of left lower limb, including hip: Secondary | ICD-10-CM | POA: Diagnosis not present

## 2018-09-29 DIAGNOSIS — D485 Neoplasm of uncertain behavior of skin: Secondary | ICD-10-CM | POA: Diagnosis not present

## 2018-09-29 DIAGNOSIS — D225 Melanocytic nevi of trunk: Secondary | ICD-10-CM | POA: Diagnosis not present

## 2018-09-29 DIAGNOSIS — L918 Other hypertrophic disorders of the skin: Secondary | ICD-10-CM | POA: Diagnosis not present

## 2018-09-29 MED FILL — RYBELSUS 7 MG TABS: 7 | 90 days supply | Qty: 90 | Fill #0

## 2018-09-29 MED FILL — metroNIDAZOLE 0.75 % GEL: 0.75 | 10 days supply | Qty: 45 | Fill #0

## 2018-09-30 MED FILL — DASETTA 7/7/7-28 TABLET: 0.5/0.75/1- | 84 days supply | Qty: 84 | Fill #0

## 2018-11-18 MED FILL — HYDROCHLOROTHIAZIDE 25 MG T: 25 | 90 days supply | Qty: 90 | Fill #1

## 2018-11-18 MED FILL — LEVOCETIRIZINE 5 MG TABLET: 5 | 90 days supply | Qty: 90 | Fill #0

## 2018-11-18 MED FILL — AZELASTINE HCL 137 MCG SPRY: 0.1 | 50 days supply | Qty: 30 | Fill #1

## 2018-12-02 MED FILL — metroNIDAZOLE 0.75 % GEL: 0.75 | 10 days supply | Qty: 45 | Fill #1

## 2018-12-29 MED FILL — RYBELSUS 7 MG TABS: 7 | 30 days supply | Qty: 30 | Fill #1

## 2018-12-29 MED FILL — DASETTA 7/7/7-28 TABLET: 0.5/0.75/1- | 84 days supply | Qty: 84 | Fill #1

## 2019-02-02 MED FILL — RYBELSUS 7 MG TABS: 7 | 30 days supply | Qty: 30 | Fill #0

## 2019-02-05 ENCOUNTER — Other Ambulatory Visit: Payer: Self-pay

## 2019-02-05 DIAGNOSIS — Z20822 Contact with and (suspected) exposure to covid-19: Secondary | ICD-10-CM

## 2019-02-14 MED FILL — HYDROCHLOROTHIAZIDE 25 MG T: 25 | 90 days supply | Qty: 90 | Fill #2

## 2019-03-08 DIAGNOSIS — Z7189 Other specified counseling: Secondary | ICD-10-CM | POA: Diagnosis not present

## 2019-03-08 DIAGNOSIS — E7800 Pure hypercholesterolemia, unspecified: Secondary | ICD-10-CM | POA: Diagnosis not present

## 2019-03-08 DIAGNOSIS — R197 Diarrhea, unspecified: Secondary | ICD-10-CM | POA: Diagnosis not present

## 2019-03-08 DIAGNOSIS — E1169 Type 2 diabetes mellitus with other specified complication: Secondary | ICD-10-CM | POA: Diagnosis not present

## 2019-03-08 DIAGNOSIS — Z7984 Long term (current) use of oral hypoglycemic drugs: Secondary | ICD-10-CM | POA: Diagnosis not present

## 2019-03-08 DIAGNOSIS — Z794 Long term (current) use of insulin: Secondary | ICD-10-CM | POA: Diagnosis not present

## 2019-03-08 DIAGNOSIS — D72829 Elevated white blood cell count, unspecified: Secondary | ICD-10-CM | POA: Diagnosis not present

## 2019-03-08 MED FILL — RYBELSUS 14 MG TABS: 14 | 90 days supply | Qty: 90 | Fill #0

## 2019-03-08 MED FILL — FEXOFENADINE HCL 180 MG TAB: 180 | 90 days supply | Qty: 90 | Fill #0

## 2019-03-08 MED FILL — ACCU-CHEK GUIDE STRP: 90 days supply | Qty: 100 | Fill #0

## 2019-03-08 MED FILL — PRAVASTATIN SODIUM 20 MG TA: 20 | 84 days supply | Qty: 24 | Fill #0

## 2019-03-08 MED FILL — SM ALCOHOL 70% PREP PADS: 70 | 30 days supply | Qty: 100 | Fill #0

## 2019-03-08 MED FILL — CHOLESTYRAMINE PACKET: 4 | 90 days supply | Qty: 90 | Fill #0

## 2019-03-08 MED FILL — AZELASTINE HCL 137 MCG SPRY: 0.1 | 50 days supply | Qty: 30 | Fill #2

## 2019-03-08 MED FILL — LEVOCETIRIZINE 5 MG TABLET: 5 | 90 days supply | Qty: 90 | Fill #1

## 2019-03-08 MED FILL — metroNIDAZOLE 0.75 % GEL: 0.75 | 10 days supply | Qty: 45 | Fill #2

## 2019-03-09 MED FILL — DASETTA 7/7/7-28 TABLET: 0.5/0.75/1- | 84 days supply | Qty: 84 | Fill #2

## 2019-03-13 DIAGNOSIS — H903 Sensorineural hearing loss, bilateral: Secondary | ICD-10-CM | POA: Diagnosis not present

## 2019-03-28 MED FILL — FREESTYLE LITE METER: 30 days supply | Qty: 1 | Fill #0

## 2019-03-28 MED FILL — FREESTYLE LANCETS: 90 days supply | Qty: 100 | Fill #0

## 2019-04-07 DIAGNOSIS — H9042 Sensorineural hearing loss, unilateral, left ear, with unrestricted hearing on the contralateral side: Secondary | ICD-10-CM | POA: Insufficient documentation

## 2019-04-12 DIAGNOSIS — K901 Tropical sprue: Secondary | ICD-10-CM | POA: Diagnosis not present

## 2019-04-12 DIAGNOSIS — K529 Noninfective gastroenteritis and colitis, unspecified: Secondary | ICD-10-CM | POA: Diagnosis not present

## 2019-04-21 DIAGNOSIS — Z1159 Encounter for screening for other viral diseases: Secondary | ICD-10-CM | POA: Diagnosis not present

## 2019-04-26 DIAGNOSIS — K3189 Other diseases of stomach and duodenum: Secondary | ICD-10-CM | POA: Diagnosis not present

## 2019-04-26 DIAGNOSIS — R768 Other specified abnormal immunological findings in serum: Secondary | ICD-10-CM | POA: Diagnosis not present

## 2019-04-26 DIAGNOSIS — K900 Celiac disease: Secondary | ICD-10-CM | POA: Diagnosis not present

## 2019-04-27 DIAGNOSIS — H524 Presbyopia: Secondary | ICD-10-CM | POA: Diagnosis not present

## 2019-04-27 DIAGNOSIS — E119 Type 2 diabetes mellitus without complications: Secondary | ICD-10-CM | POA: Diagnosis not present

## 2019-04-27 DIAGNOSIS — H5213 Myopia, bilateral: Secondary | ICD-10-CM | POA: Diagnosis not present

## 2019-04-27 DIAGNOSIS — H52223 Regular astigmatism, bilateral: Secondary | ICD-10-CM | POA: Diagnosis not present

## 2019-05-02 DIAGNOSIS — Z01419 Encounter for gynecological examination (general) (routine) without abnormal findings: Secondary | ICD-10-CM | POA: Diagnosis not present

## 2019-05-02 DIAGNOSIS — Z6838 Body mass index (BMI) 38.0-38.9, adult: Secondary | ICD-10-CM | POA: Diagnosis not present

## 2019-05-02 DIAGNOSIS — Z793 Long term (current) use of hormonal contraceptives: Secondary | ICD-10-CM | POA: Diagnosis not present

## 2019-05-02 DIAGNOSIS — Z1231 Encounter for screening mammogram for malignant neoplasm of breast: Secondary | ICD-10-CM | POA: Diagnosis not present

## 2019-05-02 DIAGNOSIS — Z124 Encounter for screening for malignant neoplasm of cervix: Secondary | ICD-10-CM | POA: Diagnosis not present

## 2019-05-02 DIAGNOSIS — Z1151 Encounter for screening for human papillomavirus (HPV): Secondary | ICD-10-CM | POA: Diagnosis not present

## 2019-05-02 DIAGNOSIS — Z1389 Encounter for screening for other disorder: Secondary | ICD-10-CM | POA: Diagnosis not present

## 2019-05-02 DIAGNOSIS — Z130 Encounter for screening for diseases of the blood and blood-forming organs and certain disorders involving the immune mechanism: Secondary | ICD-10-CM | POA: Diagnosis not present

## 2019-05-03 ENCOUNTER — Encounter: Payer: 59 | Attending: Gastroenterology | Admitting: Registered"

## 2019-05-03 ENCOUNTER — Other Ambulatory Visit: Payer: Self-pay

## 2019-05-03 DIAGNOSIS — K900 Celiac disease: Secondary | ICD-10-CM | POA: Insufficient documentation

## 2019-05-03 NOTE — Progress Notes (Signed)
[employer] Employee visit 1 of 3  Medical Nutrition Therapy:  Appt start time: 1410 end time:  1510.   Assessment:  Primary concerns today: Celiac disease. Patient states biopsy confirmed celiac disease diagnosis today.   Patient states she and her husband do not cook often and usually get take out for dinner. Pt reports regular consumption of wheat containing foods. Pt reports she has had some digestive issues last 20 yrs since having gallbladder removed but had become more intense with more frequent diarrhea.  Pt reports she takes Rybelsus for diabetes as well as for weight loss and reports 15 lbs weight reduction in last year. Weight loss could also be due in part to Celiac Disease.  Nutrition Related PMH: cholecystectomy, T2DM ~3 yrs, A1c 6.4% 4 yrs ago, RD did not ask patient if she has an updated A1c value. Pt states she has a family history of T2DM. Pt denies history of GDM  Preferred Learning Style:   No preference indicated   Learning Readiness:   Ready  MEDICATIONS: reviewed.  Pt reports she had been taking Bydureon but has been switched to Rybelsus.    DIETARY INTAKE:  24-hr recall:  B ( AM): poptart OR cereal OR Jimmy Dean breakfast bowls   Snk ( AM): PB crackers OR pretzels L ( PM): salad OR wrap from hospital cafeteria OR pizza  Snk ( PM): none D ( PM): take out Snk ( PM): none Beverages: water, diet Mtn Dew  Usual physical activity: not assessed   Estimated energy needs:not assessed  Progress Towards Goal(s):  New goals.   Nutritional Diagnosis:  NI-5.8.3 Inappropriate intake of types of carbohydrates (specify): gluten containing foods As related to dietary intake of gluten causing diarrhea secondary to celiac disease.  As evidenced by dietary recall, sign and symptoms and biopsy confirming celiac disease.    Intervention:  Nutrition Education. Discussed foods containing gluten. Taught how to read labels for gluten containing ingredients as well as ross  contamination. Provided Internet resource for further study.  Teaching Method Utilized:  Visual Auditory Hands on  Handouts given during visit include:  AND Celiac Disease Food lists and reading labels.  Barriers to learning/adherence to lifestyle change: none  Demonstrated degree of understanding via:  Teach Back   Monitoring/Evaluation:  Dietary intake, exercise, gluten awareness, and body weight in 4 week(s).

## 2019-05-22 MED FILL — HYDROCHLOROTHIAZIDE 25 MG T: 25 | 90 days supply | Qty: 90 | Fill #3

## 2019-05-29 ENCOUNTER — Ambulatory Visit: Payer: 59 | Admitting: Registered"

## 2019-06-08 MED FILL — SM FEXOFENADINE 180 MG TAB: 180 MG | 90 days supply | Qty: 90 | Fill #1

## 2019-06-08 MED FILL — DASETTA 7/7/7-28 TABLET: 0.5/0.75/1- | 84 days supply | Qty: 84 | Fill #0

## 2019-06-08 MED FILL — FREESTYLE LITE TEST STRIP: 50 days supply | Qty: 50 | Fill #0

## 2019-06-08 MED FILL — LEVOCETIRIZINE 5 MG TABLET: 5 | 90 days supply | Qty: 90 | Fill #2

## 2019-06-08 MED FILL — PRAVASTATIN SODIUM 20 MG TA: 20 | 84 days supply | Qty: 24 | Fill #1

## 2019-06-08 MED FILL — SM ALCOHOL 70% PREP PADS: 70 | 30 days supply | Qty: 100 | Fill #1

## 2019-06-09 MED FILL — RYBELSUS 14 MG TABS: 14 | 90 days supply | Qty: 90 | Fill #1

## 2019-06-12 ENCOUNTER — Telehealth: Payer: 59 | Admitting: Nurse Practitioner

## 2019-06-12 DIAGNOSIS — N3000 Acute cystitis without hematuria: Secondary | ICD-10-CM

## 2019-06-12 MED ORDER — CEPHALEXIN 500 MG PO CAPS: 500.0000 mg | ORAL_CAPSULE | Freq: Two times a day (BID) | ORAL | 0 refills | Status: DC

## 2019-06-12 MED FILL — CEPHALEXIN 500 MG CAPSULE: 500 | 7 days supply | Qty: 14 | Fill #0

## 2019-06-12 NOTE — Progress Notes (Signed)

## 2019-06-28 ENCOUNTER — Ambulatory Visit: Payer: 59 | Admitting: Registered"

## 2019-06-28 DIAGNOSIS — K901 Tropical sprue: Secondary | ICD-10-CM | POA: Diagnosis not present

## 2019-07-26 DIAGNOSIS — J3089 Other allergic rhinitis: Secondary | ICD-10-CM | POA: Diagnosis not present

## 2019-07-26 DIAGNOSIS — J301 Allergic rhinitis due to pollen: Secondary | ICD-10-CM | POA: Diagnosis not present

## 2019-07-26 DIAGNOSIS — L503 Dermatographic urticaria: Secondary | ICD-10-CM | POA: Diagnosis not present

## 2019-07-26 DIAGNOSIS — J3081 Allergic rhinitis due to animal (cat) (dog) hair and dander: Secondary | ICD-10-CM | POA: Diagnosis not present

## 2019-07-31 ENCOUNTER — Other Ambulatory Visit: Payer: Self-pay

## 2019-07-31 ENCOUNTER — Encounter: Payer: 59 | Attending: Gastroenterology | Admitting: Registered"

## 2019-07-31 DIAGNOSIS — K900 Celiac disease: Secondary | ICD-10-CM | POA: Insufficient documentation

## 2019-07-31 NOTE — Patient Instructions (Addendum)
For celiac disease resist "cheating" due to amount of time it takes to heal Consider keeping up with your exercise goal to help with insulin resistance Consider talking to your MD about potentially trying metformin again Investigate what you can do to help relieve stress to reduce sweet cravings

## 2019-07-31 NOTE — Progress Notes (Signed)
[facility] Employee visit 2 of 3  Medical Nutrition Therapy:  Appt start time: 1130 end time:  1200.   Assessment:  Primary concerns today: Celiac disease and T2DM.   Employee states her diarrhea symptoms have improved, but not completely resolved. Employee states she finds herself cheating once in a while.  Employee states she continues to eat out and is finding gluten-free options at [employer] states she needs to exercise more and working on it with the last 2 weeks exercising 4x/week. Employee is likely not getting her heart rate up to moderate intensity for the best benefit.  Employee states her A1c has been staying around 7% for the last few years. Latest lab 02/2019 was 6.8%  Employee is checking blood glucose pre-meal and appears they are mostly within target range of an A1c lower than 7%. Employee states the new meter that the insurance covers doesn't have an option to mark the reading as fasting or post meal, nor does it sync to an app for convenience.   Employee reports she continues taking Rybelsus. Employee states she has regained some weight but realizes that some of the weight loss could have been due to celiac disease.    Nutrition Related PMH: cholecystectomy, T2DM ~3 yrs,   Preferred Learning Style:   No preference indicated   Learning Readiness:   Ready  MEDICATIONS: reviewed.  Pt reports she had been taking Bydureon but has been switched to Rybelsus.    DIETARY INTAKE:  24-hr recall:  B ( AM): oatmeal OR Lucky Charms OR Jimmy Dean breakfast bowls OR peanut butter  Snk ( AM): nuts L ( PM): GF buns to switch bread OR 3-4x/week GF wrap OR GF pizza Snk ( PM): none D (3-4 PM):take out [employer] OR egg-sand OR taco salads Snk ( PM): none Beverages: water, diet Mtn Dew, coke zero, sugar-free orange drink  Usual physical activity: not assessed   Estimated energy needs:not assessed  Progress Towards Goal(s):  In progress.   Nutritional  Diagnosis:  NI-5.8.3 Inappropriate intake of types of carbohydrates (specify): gluten containing foods As related to dietary intake of gluten causing diarrhea secondary to celiac disease.  As evidenced by dietary recall, sign and symptoms and biopsy confirming celiac disease.    Intervention:  Nutrition Education. Discussed cross contamination. Discussed diabetes medications and timing of blood sugar monitoring  Plan:  For celiac disease resist "cheating" due to amount of time it takes to heal  Consider keeping up with your exercise goal to help with insulin resistance  Consider talking to your MD about potentially trying metformin again  Investigate what you can do to help relieve stress to reduce sweet cravings  Teaching Method Utilized:  Visual Auditory Hands on  Handouts given during visit include:  none  Barriers to learning/adherence to lifestyle change: none  Demonstrated degree of understanding via:  Teach Back   Monitoring/Evaluation:  Dietary intake, exercise, gluten awareness, blood sugar readings and body weight in 4 week(s).

## 2019-08-18 MED FILL — HYDROCHLOROTHIAZIDE 25 MG T: 25 | 90 days supply | Qty: 90 | Fill #0

## 2019-09-07 ENCOUNTER — Other Ambulatory Visit (HOSPITAL_COMMUNITY): Payer: Self-pay | Admitting: Family Medicine

## 2019-09-07 DIAGNOSIS — D72829 Elevated white blood cell count, unspecified: Secondary | ICD-10-CM | POA: Diagnosis not present

## 2019-09-07 DIAGNOSIS — Z0000 Encounter for general adult medical examination without abnormal findings: Secondary | ICD-10-CM | POA: Diagnosis not present

## 2019-09-07 DIAGNOSIS — E559 Vitamin D deficiency, unspecified: Secondary | ICD-10-CM | POA: Diagnosis not present

## 2019-09-07 DIAGNOSIS — E1169 Type 2 diabetes mellitus with other specified complication: Secondary | ICD-10-CM | POA: Diagnosis not present

## 2019-09-07 DIAGNOSIS — D473 Essential (hemorrhagic) thrombocythemia: Secondary | ICD-10-CM | POA: Diagnosis not present

## 2019-09-07 DIAGNOSIS — Z1211 Encounter for screening for malignant neoplasm of colon: Secondary | ICD-10-CM | POA: Diagnosis not present

## 2019-09-07 DIAGNOSIS — E7800 Pure hypercholesterolemia, unspecified: Secondary | ICD-10-CM | POA: Diagnosis not present

## 2019-09-07 DIAGNOSIS — K901 Tropical sprue: Secondary | ICD-10-CM | POA: Diagnosis not present

## 2019-09-07 DIAGNOSIS — L501 Idiopathic urticaria: Secondary | ICD-10-CM | POA: Diagnosis not present

## 2019-09-07 MED FILL — SM FEXOFENADINE 180 MG TAB: 180 MG | 90 days supply | Qty: 90 | Fill #0

## 2019-09-07 MED FILL — LEVOCETIRIZINE 5 MG TABLET: 5 | 90 days supply | Qty: 90 | Fill #0

## 2019-09-07 MED FILL — DASETTA 7/7/7-28 TABLET: 0.5/0.75/1- | 84 days supply | Qty: 84 | Fill #1

## 2019-09-07 MED FILL — PRAVASTATIN SODIUM 20 MG TA: 20 | 84 days supply | Qty: 24 | Fill #0

## 2019-09-08 ENCOUNTER — Other Ambulatory Visit (HOSPITAL_COMMUNITY): Payer: Self-pay | Admitting: Allergy and Immunology

## 2019-09-08 MED FILL — AZELASTINE HCL 137 MCG SPRY: 0.1 | 25 days supply | Qty: 30 | Fill #0

## 2019-09-11 ENCOUNTER — Other Ambulatory Visit: Payer: Self-pay

## 2019-09-11 ENCOUNTER — Encounter: Payer: 59 | Attending: Gastroenterology | Admitting: Registered"

## 2019-09-11 DIAGNOSIS — K900 Celiac disease: Secondary | ICD-10-CM | POA: Insufficient documentation

## 2019-09-11 NOTE — Progress Notes (Signed)
Employee visit 3 of 3  Medical Nutrition Therapy:  Appt start time: 4665 end time:  9935.  Assessment:  Primary concerns today: Celiac disease and T2DM.   Patient states last Thursday she had annual physical including labs drawn with A1c included. Pt is disappointed to not have labs released yet. Patient is tracking CBG with MySugr app that downloads data from her Accu-chek guide meter. Patient is check several times a week and appears she is staying within a range that would correlate to an A1c less than 7%  Employee states her Celiac Disease symptoms continue to improve, but c/o frequent bloating.  Medication: Employee reports she had discussion with MD about potential changes to DM medications, but for now continues just taking max dose of Rybelsus. RD reminded patient that exercise will achieve some of the same benefits as metformin.  Nutrition Related PMH: cholecystectomy, T2DM ~3 yrs,   Preferred Learning Style:   No preference indicated   Learning Readiness:   Ready  MEDICATIONS: reviewed.      DIETARY INTAKE:  24-hr recall:  B ( AM): oatmeal with peanut butter  Snk ( AM): none L ( PM): GF wrap at cafeteria Snk ( PM): none D (3-4 PM):salad, grilled chicken Snk ( PM): none Beverages: water, diet beverages  Usual physical activity: not assessed   Estimated energy needs:not assessed  Progress Towards Goal(s):  In progress.   Nutritional Diagnosis:  NI-5.8.3 Inappropriate intake of types of carbohydrates (specify): gluten containing foods As related to dietary intake of gluten causing diarrhea secondary to celiac disease.  As evidenced by dietary recall, sign and symptoms and biopsy confirming celiac disease.    Intervention:  Nutrition Education. Discussed meal planning for both Celiac Disease and Diabetes. Discussed the macro nutrient components of meal planning as well   Plan:  Continue with the beneficial changes you have made so far.  Consider paying  attention to processed food intake and choose more whole foods as you can  Consider exercising 3-5x/week at moderate intensity  Aim to have protein with each meal  Teaching Method Utilized:  Visual Auditory Hands on  Handouts given during visit include:  Diabetes/Celiac Disease 7 day meal plan (edited plan from http://rios.biz/)  Barriers to learning/adherence to lifestyle change: none  Demonstrated degree of understanding via:  Teach Back   Monitoring/Evaluation:  Dietary intake, exercise, gluten awareness, blood sugar readings and body weight prn.

## 2019-09-18 ENCOUNTER — Other Ambulatory Visit (HOSPITAL_COMMUNITY): Payer: Self-pay | Admitting: Family Medicine

## 2019-09-19 MED FILL — OZEMPIC 1 MG/DOSE SOPN: 2 | 84 days supply | Qty: 9 | Fill #0

## 2019-09-26 DIAGNOSIS — E876 Hypokalemia: Secondary | ICD-10-CM | POA: Diagnosis not present

## 2019-10-24 ENCOUNTER — Telehealth: Payer: 59 | Admitting: Family

## 2019-10-24 DIAGNOSIS — R300 Dysuria: Secondary | ICD-10-CM | POA: Diagnosis not present

## 2019-10-24 MED ORDER — NITROFURANTOIN MONOHYD MACRO 100 MG PO CAPS: 100.0000 mg | ORAL_CAPSULE | Freq: Two times a day (BID) | ORAL | 0 refills | Status: DC

## 2019-10-24 NOTE — Progress Notes (Signed)
We are sorry that you are not feeling well.  Here is how we plan to help!  Based on what you shared with me it looks like you most likely have a simple urinary tract infection.  A UTI (Urinary Tract Infection) is a bacterial infection of the bladder.  Most cases of urinary tract infections are simple to treat but a key part of your care is to encourage you to drink plenty of fluids and watch your symptoms carefully.  I have prescribed MacroBid 100 mg twice a day for 5 days.  Your symptoms should gradually improve. Call us if the burning in your urine worsens, you develop worsening fever, back pain or pelvic pain or if your symptoms do not resolve after completing the antibiotic.  Urinary tract infections can be prevented by drinking plenty of water to keep your body hydrated.  Also be sure when you wipe, wipe from front to back and don't hold it in!  If possible, empty your bladder every 4 hours.  Your e-visit answers were reviewed by a board certified advanced clinical practitioner to complete your personal care plan.  Depending on the condition, your plan could have included both over the counter or prescription medications.  If there is a problem please reply  once you have received a response from your provider.  Your safety is important to [place].  If you have drug allergies check your prescription carefully.    You can use MyChart to ask questions about today's visit, request a non-urgent call back, or ask for a work or school excuse for 24 hours related to this e-Visit. If it has been greater than 24 hours you will need to follow up with your provider, or enter a new e-Visit to address those concerns.   You will get an e-mail in the next two days asking about your experience.  I hope that your e-visit has been valuable and will speed your recovery. Thank you for using e-visits.   Greater than 5 minutes, yet less than 10 minutes of time have been spent researching, coordinating, and  implementing care for this patient today.  Thank you for the details you included in the comment boxes. Those details are very helpful in determining the best course of treatment for you and help [place] to provide the best care.

## 2019-11-13 MED FILL — PRAVASTATIN SODIUM 20 MG TA: 20 | 84 days supply | Qty: 24 | Fill #2

## 2019-11-13 MED FILL — HYDROCHLOROTHIAZIDE 25 MG T: 25 | 90 days supply | Qty: 90 | Fill #1

## 2019-11-22 DIAGNOSIS — Z20828 Contact with and (suspected) exposure to other viral communicable diseases: Secondary | ICD-10-CM | POA: Diagnosis not present

## 2019-11-28 ENCOUNTER — Ambulatory Visit: Payer: 59 | Attending: Internal Medicine

## 2019-11-28 DIAGNOSIS — Z23 Encounter for immunization: Secondary | ICD-10-CM

## 2019-11-28 NOTE — Progress Notes (Signed)
   Covid-19 Vaccination Clinic  Name:  ANWEN CANNEDY    MRN: [id number] DOB: 1975-09-18  11/28/2019  [name] was observed post Covid-19 immunization for 15 minutes without incident. She was provided with Vaccine Information Sheet and instruction to access the V-Safe system.   Ms. Audie Box was instructed to call 911 with any severe reactions post vaccine: Marland Kitchen Difficulty breathing  . Swelling of face and throat  . A fast heartbeat  . A bad rash all over body  . Dizziness and weakness

## 2019-12-01 MED FILL — LEVOCETIRIZINE 5 MG TABLET: 5 | 90 days supply | Qty: 90 | Fill #1

## 2019-12-01 MED FILL — DASETTA 7/7/7-28 TABLET: 0.5/0.75/1- | 84 days supply | Qty: 84 | Fill #2

## 2019-12-01 MED FILL — OZEMPIC (1 MG/DOSE) 4 MG/3M: 4 | 84 days supply | Qty: 9 | Fill #0

## 2019-12-01 MED FILL — SM ALCOHOL 70% PREP PADS: 70 | 30 days supply | Qty: 100 | Fill #2

## 2019-12-05 ENCOUNTER — Ambulatory Visit: Payer: 59

## 2019-12-11 DIAGNOSIS — E1169 Type 2 diabetes mellitus with other specified complication: Secondary | ICD-10-CM | POA: Diagnosis not present

## 2019-12-18 DIAGNOSIS — Z1211 Encounter for screening for malignant neoplasm of colon: Secondary | ICD-10-CM | POA: Diagnosis not present

## 2019-12-18 DIAGNOSIS — K901 Tropical sprue: Secondary | ICD-10-CM | POA: Diagnosis not present

## 2019-12-19 DIAGNOSIS — L83 Acanthosis nigricans: Secondary | ICD-10-CM | POA: Diagnosis not present

## 2019-12-19 DIAGNOSIS — D2261 Melanocytic nevi of right upper limb, including shoulder: Secondary | ICD-10-CM | POA: Diagnosis not present

## 2019-12-19 DIAGNOSIS — D2262 Melanocytic nevi of left upper limb, including shoulder: Secondary | ICD-10-CM | POA: Diagnosis not present

## 2019-12-19 DIAGNOSIS — D2272 Melanocytic nevi of left lower limb, including hip: Secondary | ICD-10-CM | POA: Diagnosis not present

## 2019-12-19 DIAGNOSIS — L814 Other melanin hyperpigmentation: Secondary | ICD-10-CM | POA: Diagnosis not present

## 2019-12-19 DIAGNOSIS — L821 Other seborrheic keratosis: Secondary | ICD-10-CM | POA: Diagnosis not present

## 2019-12-19 DIAGNOSIS — D225 Melanocytic nevi of trunk: Secondary | ICD-10-CM | POA: Diagnosis not present

## 2019-12-19 DIAGNOSIS — D2271 Melanocytic nevi of right lower limb, including hip: Secondary | ICD-10-CM | POA: Diagnosis not present

## 2019-12-19 DIAGNOSIS — L723 Sebaceous cyst: Secondary | ICD-10-CM | POA: Diagnosis not present

## 2020-01-04 ENCOUNTER — Other Ambulatory Visit (HOSPITAL_COMMUNITY): Payer: Self-pay | Admitting: Gastroenterology

## 2020-01-19 ENCOUNTER — Other Ambulatory Visit: Payer: Self-pay | Admitting: Emergency Medicine

## 2020-01-19 ENCOUNTER — Telehealth: Payer: 59 | Admitting: Emergency Medicine

## 2020-01-19 DIAGNOSIS — R300 Dysuria: Secondary | ICD-10-CM | POA: Diagnosis not present

## 2020-01-19 MED ORDER — NITROFURANTOIN MONOHYD MACRO 100 MG PO CAPS: 100.0000 mg | ORAL_CAPSULE | Freq: Two times a day (BID) | ORAL | 0 refills | Status: DC

## 2020-01-19 MED FILL — NITROFURANTOIN MONO-MCR 100: 100 | 5 days supply | Qty: 10 | Fill #0

## 2020-01-19 NOTE — Progress Notes (Signed)
We are sorry that you are not feeling well.  Here is how we plan to help!  **Please do not respond to this message unless you have follow up questions.**  Based on what you shared with me it looks like you most likely have a simple urinary tract infection.  A UTI (Urinary Tract Infection) is a bacterial infection of the bladder.  Most cases of urinary tract infections are simple to treat but a key part of your care is to encourage you to drink plenty of fluids and watch your symptoms carefully.  I have prescribed MacroBid 100 mg twice a day for 5 days.  Your symptoms should gradually improve. Call us if the burning in your urine worsens, you develop worsening fever, back pain or pelvic pain or if your symptoms do not resolve after completing the antibiotic.  Urinary tract infections can be prevented by drinking plenty of water to keep your body hydrated.  Also be sure when you wipe, wipe from front to back and don't hold it in!  If possible, empty your bladder every 4 hours.  Your e-visit answers were reviewed by a board certified advanced clinical practitioner to complete your personal care plan.  Depending on the condition, your plan could have included both over the counter or prescription medications.  If there is a problem please reply  once you have received a response from your provider.  Your safety is important to [place].  If you have drug allergies check your prescription carefully.    You can use MyChart to ask questions about today's visit, request a non-urgent call back, or ask for a work or school excuse for 24 hours related to this e-Visit. If it has been greater than 24 hours you will need to follow up with your provider, or enter a new e-Visit to address those concerns.   You will get an e-mail in the next two days asking about your experience.  I hope that your e-visit has been valuable and will speed your recovery. Thank you for using e-visits.    Greater than 5 but less  than 10 minutes spent researching, coordinating, and implementing care for this patient today

## 2020-02-14 MED FILL — OZEMPIC (1 MG/DOSE) 4 MG/3M: 4 | 84 days supply | Qty: 9 | Fill #1

## 2020-02-14 MED FILL — DASETTA 7/7/7-28 TABLET: 0.5/0.75/1- | 84 days supply | Qty: 84 | Fill #3

## 2020-02-14 MED FILL — PRAVASTATIN SODIUM 20 MG TA: 20 | 84 days supply | Qty: 24 | Fill #3

## 2020-02-14 MED FILL — HYDROCHLOROTHIAZIDE 25 MG T: 25 | 90 days supply | Qty: 90 | Fill #0

## 2020-02-14 MED FILL — AZELASTINE HCL 137 MCG SPRY: 0.1 | 25 days supply | Qty: 30 | Fill #1

## 2020-03-05 DIAGNOSIS — Z01812 Encounter for preprocedural laboratory examination: Secondary | ICD-10-CM | POA: Diagnosis not present

## 2020-03-05 DIAGNOSIS — Z1159 Encounter for screening for other viral diseases: Secondary | ICD-10-CM | POA: Diagnosis not present

## 2020-03-05 MED FILL — PEG-3350 SOLUTION: 420 | 1 days supply | Qty: 4000 | Fill #0

## 2020-03-08 DIAGNOSIS — K635 Polyp of colon: Secondary | ICD-10-CM | POA: Diagnosis not present

## 2020-03-08 DIAGNOSIS — Z1211 Encounter for screening for malignant neoplasm of colon: Secondary | ICD-10-CM | POA: Diagnosis not present

## 2020-03-19 DIAGNOSIS — H9042 Sensorineural hearing loss, unilateral, left ear, with unrestricted hearing on the contralateral side: Secondary | ICD-10-CM | POA: Diagnosis not present

## 2020-03-28 ENCOUNTER — Telehealth: Payer: 59 | Admitting: Orthopedic Surgery

## 2020-03-28 DIAGNOSIS — R399 Unspecified symptoms and signs involving the genitourinary system: Secondary | ICD-10-CM

## 2020-03-28 MED ORDER — NITROFURANTOIN MONOHYD MACRO 100 MG PO CAPS: 100.0000 mg | ORAL_CAPSULE | Freq: Two times a day (BID) | ORAL | 0 refills | Status: AC

## 2020-03-28 NOTE — Progress Notes (Signed)
We are sorry that you are not feeling well.  Here is how we plan to help!  Based on what you shared with me it looks like you most likely have a simple urinary tract infection.  A UTI (Urinary Tract Infection) is a bacterial infection of the bladder.  Most cases of urinary tract infections are simple to treat but a key part of your care is to encourage you to drink plenty of fluids and watch your symptoms carefully.  I have prescribed MacroBid 100 mg twice a day for 5 days.  Your symptoms should gradually improve. Call us if the burning in your urine worsens, you develop worsening fever, back pain or pelvic pain or if your symptoms do not resolve after completing the antibiotic.  Urinary tract infections can be prevented by drinking plenty of water to keep your body hydrated.  Also be sure when you wipe, wipe from front to back and don't hold it in!  If possible, empty your bladder every 4 hours.  Your e-visit answers were reviewed by a board certified advanced clinical practitioner to complete your personal care plan.  Depending on the condition, your plan could have included both over the counter or prescription medications.  If there is a problem please reply  once you have received a response from your provider.  Your safety is important to [place].  If you have drug allergies check your prescription carefully.    You can use MyChart to ask questions about today's visit, request a non-urgent call back, or ask for a work or school excuse for 24 hours related to this e-Visit. If it has been greater than 24 hours you will need to follow up with your provider, or enter a new e-Visit to address those concerns.   You will get an e-mail in the next two days asking about your experience.  I hope that your e-visit has been valuable and will speed your recovery. Thank you for using e-visits.   Greater than 5 minutes, yet less than 10 minutes of time have been spent researching, coordinating and  implementing care for this patient today.

## 2020-04-29 MED FILL — AZELASTINE HCL 137 MCG SPRY: 0.1 | 25 days supply | Qty: 30 | Fill #2

## 2020-05-01 DIAGNOSIS — H5213 Myopia, bilateral: Secondary | ICD-10-CM | POA: Diagnosis not present

## 2020-05-01 DIAGNOSIS — E119 Type 2 diabetes mellitus without complications: Secondary | ICD-10-CM | POA: Diagnosis not present

## 2020-05-01 DIAGNOSIS — H524 Presbyopia: Secondary | ICD-10-CM | POA: Diagnosis not present

## 2020-05-01 DIAGNOSIS — H52223 Regular astigmatism, bilateral: Secondary | ICD-10-CM | POA: Diagnosis not present

## 2020-05-06 ENCOUNTER — Other Ambulatory Visit (HOSPITAL_COMMUNITY): Payer: Self-pay | Admitting: Dermatology

## 2020-05-06 MED FILL — metroNIDAZOLE 0.75 % GEL: 0.75 | 20 days supply | Qty: 45 | Fill #0

## 2020-05-08 ENCOUNTER — Other Ambulatory Visit (HOSPITAL_COMMUNITY): Payer: Self-pay | Admitting: Obstetrics and Gynecology

## 2020-05-08 DIAGNOSIS — E1169 Type 2 diabetes mellitus with other specified complication: Secondary | ICD-10-CM | POA: Diagnosis not present

## 2020-05-08 DIAGNOSIS — Z01419 Encounter for gynecological examination (general) (routine) without abnormal findings: Secondary | ICD-10-CM | POA: Diagnosis not present

## 2020-05-08 DIAGNOSIS — D473 Essential (hemorrhagic) thrombocythemia: Secondary | ICD-10-CM | POA: Diagnosis not present

## 2020-05-08 DIAGNOSIS — Z6838 Body mass index (BMI) 38.0-38.9, adult: Secondary | ICD-10-CM | POA: Diagnosis not present

## 2020-05-08 DIAGNOSIS — Z1389 Encounter for screening for other disorder: Secondary | ICD-10-CM | POA: Diagnosis not present

## 2020-05-08 DIAGNOSIS — D72829 Elevated white blood cell count, unspecified: Secondary | ICD-10-CM | POA: Diagnosis not present

## 2020-05-08 DIAGNOSIS — E7800 Pure hypercholesterolemia, unspecified: Secondary | ICD-10-CM | POA: Diagnosis not present

## 2020-05-08 DIAGNOSIS — Z3041 Encounter for surveillance of contraceptive pills: Secondary | ICD-10-CM | POA: Diagnosis not present

## 2020-05-08 DIAGNOSIS — Z130 Encounter for screening for diseases of the blood and blood-forming organs and certain disorders involving the immune mechanism: Secondary | ICD-10-CM | POA: Diagnosis not present

## 2020-05-08 DIAGNOSIS — Z1231 Encounter for screening mammogram for malignant neoplasm of breast: Secondary | ICD-10-CM | POA: Diagnosis not present

## 2020-05-08 MED FILL — DASETTA 7/7/7-28 TABLET: 0.5/0.75/1- | 84 days supply | Qty: 84 | Fill #0

## 2020-05-13 MED FILL — OZEMPIC (1 MG/DOSE) 4 MG/3M: 4 | 84 days supply | Qty: 9 | Fill #2

## 2020-05-13 MED FILL — PRAVASTATIN SODIUM 20 MG TA: 20 | 84 days supply | Qty: 24 | Fill #1

## 2020-05-13 MED FILL — HYDROCHLOROTHIAZIDE 25 MG T: 25 | 90 days supply | Qty: 90 | Fill #1

## 2020-05-14 ENCOUNTER — Other Ambulatory Visit (HOSPITAL_COMMUNITY): Payer: Self-pay | Admitting: Allergy and Immunology

## 2020-05-14 MED FILL — LEVOCETIRIZINE 5 MG TABLET: 5 | 90 days supply | Qty: 90 | Fill #0

## 2020-06-09 ENCOUNTER — Telehealth: Payer: 59 | Admitting: Family

## 2020-06-09 DIAGNOSIS — J069 Acute upper respiratory infection, unspecified: Secondary | ICD-10-CM

## 2020-06-09 MED ORDER — FLUTICASONE PROPIONATE 50 MCG/ACT NA SUSP: 2.0000 | Freq: Every day | NASAL | 6 refills | Status: DC

## 2020-06-09 MED ORDER — BENZONATATE 100 MG PO CAPS: 100.0000 mg | ORAL_CAPSULE | Freq: Three times a day (TID) | ORAL | 0 refills | Status: DC | PRN

## 2020-06-09 NOTE — Progress Notes (Signed)
We are sorry you are not feeling well.  Here is how we plan to help!  Based on what you have shared with me, it looks like you may have a viral upper respiratory infection.  Upper respiratory infections are caused by a large number of viruses; however, rhinovirus is the most common cause.   Symptoms vary from person to person, with common symptoms including sore throat, cough, fatigue or lack of energy and feeling of general discomfort.  A low-grade fever of up to 100.4 may present, but is often uncommon.  Symptoms vary however, and are closely related to a person's age or underlying illnesses.  The most common symptoms associated with an upper respiratory infection are nasal discharge or congestion, cough, sneezing, headache and pressure in the ears and face.  These symptoms usually persist for about 3 to 10 days, but can last up to 2 weeks.  It is important to know that upper respiratory infections do not cause serious illness or complications in most cases.    Upper respiratory infections can be transmitted from person to person, with the most common method of transmission being a person's hands.  The virus is able to live on the skin and can infect other persons for up to 2 hours after direct contact.  Also, these can be transmitted when someone coughs or sneezes; thus, it is important to cover the mouth to reduce this risk.  To keep the spread of the illness at [place], good hand hygiene is very important.  This is an infection that is most likely caused by a virus. There are no specific treatments other than to help you with the symptoms until the infection runs its course.  We are sorry you are not feeling well.  Here is how we plan to help!   For nasal congestion, you may use an oral decongestants such as Mucinex D or if you have glaucoma or high blood pressure use plain Mucinex.  Saline nasal spray or nasal drops can help and can safely be used as often as needed for congestion.  For your congestion,  I have prescribed Fluticasone nasal spray one spray in each nostril twice a day  If you do not have a history of heart disease, hypertension, diabetes or thyroid disease, prostate/bladder issues or glaucoma, you may also use Sudafed to treat nasal congestion.  It is highly recommended that you consult with a pharmacist or your primary care physician to ensure this medication is safe for you to take.     If you have a cough, you may use cough suppressants such as Delsym and Robitussin.  If you have glaucoma or high blood pressure, you can also use Coricidin HBP.   For cough I have prescribed for you A prescription cough medication called Tessalon Perles 100 mg. You may take 1-2 capsules every 8 hours as needed for cough  If you have a sore or scratchy throat, use a saltwater gargle-  to  teaspoon of salt dissolved in a 4-ounce to 8-ounce glass of warm water.  Gargle the solution for approximately 15-30 seconds and then spit.  It is important not to swallow the solution.  You can also use throat lozenges/cough drops and Chloraseptic spray to help with throat pain or discomfort.  Warm or cold liquids can also be helpful in relieving throat pain.  For headache, pain or general discomfort, you can use Ibuprofen or Tylenol as directed.   Some authorities believe that zinc sprays or the use of  Echinacea may shorten the course of your symptoms.   HOME CARE . Only take medications as instructed by your medical team. . Be sure to drink plenty of fluids. Water is fine as well as fruit juices, sodas and electrolyte beverages. You may want to stay away from caffeine or alcohol. If you are nauseated, try taking small sips of liquids. How do you know if you are getting enough fluid? Your urine should be a pale yellow or almost colorless. . Get rest. . Taking a steamy shower or using a humidifier may help nasal congestion and ease sore throat pain. You can place a towel over your head and breathe in the steam  from hot water coming from a faucet. . Using a saline nasal spray works much the same way. . Cough drops, hard candies and sore throat lozenges may ease your cough. . Avoid close contacts especially the very young and the elderly . Cover your mouth if you cough or sneeze . Always remember to wash your hands.   GET HELP RIGHT AWAY IF: . You develop worsening fever. . If your symptoms do not improve within 10 days . You develop yellow or green discharge from your nose over 3 days. . You have coughing fits . You develop a severe head ache or visual changes. . You develop shortness of breath, difficulty breathing or start having chest pain . Your symptoms persist after you have completed your treatment plan  MAKE SURE YOU   Understand these instructions.  Will watch your condition.  Will get help right away if you are not doing well or get worse.  Your e-visit answers were reviewed by a board certified advanced clinical practitioner to complete your personal care plan. Depending upon the condition, your plan could have included both over the counter or prescription medications. Please review your pharmacy choice. If there is a problem, you may call our nursing hot line at and have the prescription routed to another pharmacy. Your safety is important to [place]. If you have drug allergies check your prescription carefully.   You can use MyChart to ask questions about today's visit, request a non-urgent call back, or ask for a work or school excuse for 24 hours related to this e-Visit. If it has been greater than 24 hours you will need to follow up with your provider, or enter a new e-Visit to address those concerns. You will get an e-mail in the next two days asking about your experience.  I hope that your e-visit has been valuable and will speed your recovery. Thank you for using e-visits.  Approximately 5 minutes was spent documenting and reviewing patient's chart.

## 2020-08-07 ENCOUNTER — Other Ambulatory Visit (HOSPITAL_COMMUNITY): Payer: Self-pay

## 2020-08-07 DIAGNOSIS — J301 Allergic rhinitis due to pollen: Secondary | ICD-10-CM | POA: Diagnosis not present

## 2020-08-07 DIAGNOSIS — L503 Dermatographic urticaria: Secondary | ICD-10-CM | POA: Diagnosis not present

## 2020-08-07 DIAGNOSIS — J3081 Allergic rhinitis due to animal (cat) (dog) hair and dander: Secondary | ICD-10-CM | POA: Diagnosis not present

## 2020-08-07 DIAGNOSIS — J3089 Other allergic rhinitis: Secondary | ICD-10-CM | POA: Diagnosis not present

## 2020-08-07 MED ORDER — FEXOFENADINE HCL 180 MG PO TABS
ORAL_TABLET | ORAL | 3 refills | Status: AC
  Filled 2020-08-07: qty 90, 90d supply, fill #0

## 2020-08-07 MED ORDER — AZELASTINE HCL 0.1 % NA SOLN
NASAL | 5 refills | Status: DC
  Filled 2020-08-07: qty 30, 30d supply, fill #0
  Filled 2020-09-09: qty 90, 75d supply, fill #0
  Filled 2021-02-05: qty 90, 75d supply, fill #1

## 2020-08-08 ENCOUNTER — Other Ambulatory Visit (HOSPITAL_COMMUNITY): Payer: Self-pay

## 2020-08-08 MED ORDER — LEVOCETIRIZINE DIHYDROCHLORIDE 5 MG PO TABS
5.0000 mg | ORAL_TABLET | Freq: Every evening | ORAL | 3 refills | Status: AC
  Filled 2020-08-08: qty 90, 90d supply, fill #0

## 2020-08-12 ENCOUNTER — Other Ambulatory Visit (HOSPITAL_COMMUNITY): Payer: Self-pay

## 2020-08-12 MED FILL — Norethindrone-Eth Estradiol Tab 0.5-35/0.75-35/1-35 MG-MCG: ORAL | 84 days supply | Qty: 84 | Fill #0 | Status: AC

## 2020-08-12 MED FILL — Azelastine HCl Nasal Spray 0.1% (137 MCG/SPRAY): NASAL | 30 days supply | Qty: 30 | Fill #0 | Status: AC

## 2020-08-12 MED FILL — Pravastatin Sodium Tab 20 MG: ORAL | 84 days supply | Qty: 24 | Fill #0 | Status: AC

## 2020-08-12 MED FILL — Semaglutide Soln Pen-inj 1 MG/DOSE (4 MG/3ML): SUBCUTANEOUS | 28 days supply | Qty: 3 | Fill #0 | Status: AC

## 2020-08-12 MED FILL — Hydrochlorothiazide Tab 25 MG: ORAL | 90 days supply | Qty: 90 | Fill #0 | Status: AC

## 2020-08-27 ENCOUNTER — Telehealth: Payer: 59 | Admitting: Physician Assistant

## 2020-08-27 DIAGNOSIS — R300 Dysuria: Secondary | ICD-10-CM | POA: Diagnosis not present

## 2020-08-27 MED ORDER — CEPHALEXIN 500 MG PO CAPS: 500.0000 mg | ORAL_CAPSULE | Freq: Two times a day (BID) | ORAL | 0 refills | Status: AC

## 2020-08-27 NOTE — Progress Notes (Signed)
I have spent 5 minutes in review of e-visit questionnaire, review and updating patient chart, medical decision making and response to patient.   Jess Sulak Cody Dinara Lupu, PA-C    

## 2020-08-27 NOTE — Progress Notes (Signed)

## 2020-09-09 ENCOUNTER — Encounter (INDEPENDENT_AMBULATORY_CARE_PROVIDER_SITE_OTHER): Payer: Self-pay

## 2020-09-09 ENCOUNTER — Other Ambulatory Visit (HOSPITAL_COMMUNITY): Payer: Self-pay

## 2020-09-12 ENCOUNTER — Other Ambulatory Visit (HOSPITAL_COMMUNITY): Payer: Self-pay

## 2020-09-12 DIAGNOSIS — E7800 Pure hypercholesterolemia, unspecified: Secondary | ICD-10-CM | POA: Diagnosis not present

## 2020-09-12 DIAGNOSIS — D473 Essential (hemorrhagic) thrombocythemia: Secondary | ICD-10-CM | POA: Diagnosis not present

## 2020-09-12 DIAGNOSIS — K901 Tropical sprue: Secondary | ICD-10-CM | POA: Diagnosis not present

## 2020-09-12 DIAGNOSIS — E1169 Type 2 diabetes mellitus with other specified complication: Secondary | ICD-10-CM | POA: Diagnosis not present

## 2020-09-12 DIAGNOSIS — L501 Idiopathic urticaria: Secondary | ICD-10-CM | POA: Diagnosis not present

## 2020-09-12 DIAGNOSIS — Z0000 Encounter for general adult medical examination without abnormal findings: Secondary | ICD-10-CM | POA: Diagnosis not present

## 2020-09-12 DIAGNOSIS — D72829 Elevated white blood cell count, unspecified: Secondary | ICD-10-CM | POA: Diagnosis not present

## 2020-09-12 MED ORDER — OZEMPIC (2 MG/DOSE) 8 MG/3ML ~~LOC~~ SOPN
2.0000 mg | PEN_INJECTOR | SUBCUTANEOUS | 3 refills | Status: DC
  Filled 2020-09-12: qty 9, 84d supply, fill #0
  Filled 2021-01-02: qty 3, 28d supply, fill #1
  Filled 2021-01-02: qty 9, 84d supply, fill #1

## 2020-09-30 ENCOUNTER — Other Ambulatory Visit (HOSPITAL_COMMUNITY): Payer: Self-pay

## 2020-11-07 ENCOUNTER — Ambulatory Visit (INDEPENDENT_AMBULATORY_CARE_PROVIDER_SITE_OTHER): Payer: Self-pay | Admitting: Family Medicine

## 2020-11-14 ENCOUNTER — Ambulatory Visit (INDEPENDENT_AMBULATORY_CARE_PROVIDER_SITE_OTHER): Payer: 59 | Admitting: Family Medicine

## 2020-11-14 ENCOUNTER — Other Ambulatory Visit (HOSPITAL_COMMUNITY): Payer: Self-pay

## 2020-11-14 ENCOUNTER — Encounter (INDEPENDENT_AMBULATORY_CARE_PROVIDER_SITE_OTHER): Payer: Self-pay | Admitting: Family Medicine

## 2020-11-14 ENCOUNTER — Other Ambulatory Visit: Payer: Self-pay

## 2020-11-14 VITALS — BP 127/79 | HR 79 | Temp 98.2°F | Ht 64.0 in | Wt 213.0 lb

## 2020-11-14 DIAGNOSIS — K9041 Non-celiac gluten sensitivity: Secondary | ICD-10-CM

## 2020-11-14 DIAGNOSIS — E1169 Type 2 diabetes mellitus with other specified complication: Secondary | ICD-10-CM | POA: Diagnosis not present

## 2020-11-14 DIAGNOSIS — Z1331 Encounter for screening for depression: Secondary | ICD-10-CM

## 2020-11-14 DIAGNOSIS — Z9189 Other specified personal risk factors, not elsewhere classified: Secondary | ICD-10-CM | POA: Diagnosis not present

## 2020-11-14 DIAGNOSIS — R0602 Shortness of breath: Secondary | ICD-10-CM | POA: Diagnosis not present

## 2020-11-14 DIAGNOSIS — Z6836 Body mass index (BMI) 36.0-36.9, adult: Secondary | ICD-10-CM

## 2020-11-14 DIAGNOSIS — E782 Mixed hyperlipidemia: Secondary | ICD-10-CM

## 2020-11-14 DIAGNOSIS — I152 Hypertension secondary to endocrine disorders: Secondary | ICD-10-CM

## 2020-11-14 DIAGNOSIS — Z0289 Encounter for other administrative examinations: Secondary | ICD-10-CM

## 2020-11-14 DIAGNOSIS — E1159 Type 2 diabetes mellitus with other circulatory complications: Secondary | ICD-10-CM

## 2020-11-14 DIAGNOSIS — R5383 Other fatigue: Secondary | ICD-10-CM | POA: Diagnosis not present

## 2020-11-14 MED ORDER — HYDROCHLOROTHIAZIDE 25 MG PO TABS
25.0000 mg | ORAL_TABLET | Freq: Every day | ORAL | 3 refills | Status: DC
  Filled 2020-11-14: qty 90, 90d supply, fill #0
  Filled 2021-02-05: qty 90, 90d supply, fill #1
  Filled 2021-04-16: qty 90, 90d supply, fill #2
  Filled 2021-07-28: qty 90, 90d supply, fill #3

## 2020-11-14 MED ORDER — PRAVASTATIN SODIUM 20 MG PO TABS
20.0000 mg | ORAL_TABLET | ORAL | 0 refills | Status: DC
  Filled 2020-11-14: qty 25, 88d supply, fill #0
  Filled 2021-02-05: qty 25, 88d supply, fill #1
  Filled 2021-04-16: qty 25, 88d supply, fill #2
  Filled 2021-07-28: qty 25, 88d supply, fill #3

## 2020-11-14 MED FILL — Norethindrone-Eth Estradiol Tab 0.5-35/0.75-35/1-35 MG-MCG: ORAL | 84 days supply | Qty: 84 | Fill #1 | Status: AC

## 2020-11-15 ENCOUNTER — Other Ambulatory Visit (HOSPITAL_COMMUNITY): Payer: Self-pay

## 2020-11-15 LAB — LIPID PANEL WITH LDL/HDL RATIO
Cholesterol, Total: 172 mg/dL (ref 100–199)
HDL: 42 mg/dL (ref >39)
LDL Chol Calc (NIH): 106 mg/dL — ABNORMAL HIGH (ref 0–99)
LDL/HDL Ratio: 2.5 ratio (ref 0.0–3.2)
Triglycerides: 134 mg/dL (ref 0–149)
VLDL Cholesterol Cal: 24 mg/dL (ref 5–40)

## 2020-11-15 LAB — CBC WITH DIFFERENTIAL/PLATELET
Basophils Absolute: 0.1 10*3/uL (ref 0.0–0.2)
Basos: 1 %
EOS (ABSOLUTE): 0.2 10*3/uL (ref 0.0–0.4)
Eos: 1 %
Hematocrit: 36.6 % (ref 34.0–46.6)
Hemoglobin: 12 g/dL (ref 11.1–15.9)
Immature Grans (Abs): 0 10*3/uL (ref 0.0–0.1)
Immature Granulocytes: 0 %
Lymphocytes Absolute: 3.8 10*3/uL — ABNORMAL HIGH (ref 0.7–3.1)
Lymphs: 31 %
MCH: 27.2 pg (ref 26.6–33.0)
MCHC: 32.8 g/dL (ref 31.5–35.7)
MCV: 83 fL (ref 79–97)
Monocytes Absolute: 0.8 10*3/uL (ref 0.1–0.9)
Monocytes: 6 %
Neutrophils Absolute: 7.3 10*3/uL — ABNORMAL HIGH (ref 1.4–7.0)
Neutrophils: 61 %
Platelets: 545 10*3/uL — ABNORMAL HIGH (ref 150–450)
RBC: 4.41 x10E6/uL (ref 3.77–5.28)
RDW: 13 % (ref 11.7–15.4)
WBC: 12.1 10*3/uL — ABNORMAL HIGH (ref 3.4–10.8)

## 2020-11-15 LAB — COMPREHENSIVE METABOLIC PANEL
ALT: 22 IU/L (ref 0–32)
AST: 38 IU/L (ref 0–40)
Albumin/Globulin Ratio: 1.4 (ref 1.2–2.2)
Albumin: 4.3 g/dL (ref 3.8–4.8)
Alkaline Phosphatase: 111 IU/L (ref 44–121)
BUN/Creatinine Ratio: 17 (ref 9–23)
BUN: 11 mg/dL (ref 6–24)
Bilirubin Total: 0.3 mg/dL (ref 0.0–1.2)
CO2: 21 mmol/L (ref 20–29)
Calcium: 9.6 mg/dL (ref 8.7–10.2)
Chloride: 100 mmol/L (ref 96–106)
Creatinine, Ser: 0.66 mg/dL (ref 0.57–1.00)
Globulin, Total: 3.1 g/dL (ref 1.5–4.5)
Glucose: 82 mg/dL (ref 65–99)
Potassium: 4 mmol/L (ref 3.5–5.2)
Sodium: 141 mmol/L (ref 134–144)
Total Protein: 7.4 g/dL (ref 6.0–8.5)
eGFR: 109 mL/min/{1.73_m2} (ref >59)

## 2020-11-15 LAB — T4, FREE: Free T4: 0.98 ng/dL (ref 0.82–1.77)

## 2020-11-15 LAB — VITAMIN B12: Vitamin B-12: 428 pg/mL (ref 232–1245)

## 2020-11-15 LAB — TSH: TSH: 0.785 u[IU]/mL (ref 0.450–4.500)

## 2020-11-15 LAB — VITAMIN D 25 HYDROXY (VIT D DEFICIENCY, FRACTURES): Vit D, 25-Hydroxy: 50.9 ng/mL (ref 30.0–100.0)

## 2020-11-15 LAB — INSULIN, RANDOM: INSULIN: 20.3 u[IU]/mL (ref 2.6–24.9)

## 2020-11-15 LAB — HEMOGLOBIN A1C
Est. average glucose Bld gHb Est-mCnc: 137 mg/dL
Hgb A1c MFr Bld: 6.4 % — ABNORMAL HIGH (ref 4.8–5.6)

## 2020-11-15 LAB — FOLATE: Folate: >20 ng/mL (ref >3.0)

## 2020-11-18 NOTE — Progress Notes (Signed)
Chief Complaint:   Elizabeth Cummings (MR# [id number]) is a 45 y.o. female who presents for evaluation and treatment of Elizabeth and related comorbidities. Current BMI is Body mass index is 36.56 kg/m. Elizabeth Cummings has been struggling with her weight for many years and has been unsuccessful in either losing weight, maintaining weight loss, or reaching her healthy weight goal.  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 She lives with her spouse Elizabeth Cummings, and her 60 year old son. She eats out 8-10 times weekly. She craves pasta, chips, and chocolate. She skips lunch or breakfast. She has gluten sensitivity, but she can eat pop tarts, cereal, and other carbohydrates with gluten.   Elizabeth Cummings is currently in the action stage of change and ready to dedicate time achieving and maintaining a healthier weight. Elizabeth Cummings is interested in becoming our patient and working on intensive lifestyle modifications including (but not limited to) diet and exercise for weight loss.  Elizabeth Cummings's habits were reviewed today and are as follows: Her family eats meals together, her desired weight loss is 38 lbs, she has been heavy most of her life, she started gaining weight after pregnancy, her heaviest weight ever was 228 pounds, she is a picky eater and doesn't like to eat healthier foods, she has significant food cravings issues, she skips meals frequently, she frequently makes poor food choices, and she frequently eats larger portions than normal.  Depression Screen Elizabeth Cummings's Food and Mood (modified PHQ-9) score was 4.  Depression screen Elizabeth Cummings 2/9 11/14/2020  Decreased Interest 0  Down, Depressed, Hopeless 0  PHQ - 2 Score 0  Altered sleeping 0  Tired, decreased energy 3  Change in appetite 1  Feeling bad or failure about yourself  0  Trouble concentrating 0  Moving slowly or fidgety/restless 0  Suicidal thoughts 0  PHQ-9 Score 4  Difficult doing work/chores Not difficult at all   Subjective:   1.  Other fatigue Elizabeth Cummings admits to daytime somnolence and admits to waking up still tired. Patent has a history of symptoms of daytime fatigue. Elizabeth Cummings generally gets 4 or 5 hours of sleep per night, and states that she has generally restful sleep. Snoring is not present. Apneic episodes are not present. Epworth Sleepiness Score is 3.  2. Shortness of breath on exertion Elizabeth Cummings notes increasing shortness of breath with exercising and seems to be worsening over time with weight gain. She notes getting out of breath sooner with activity than she used to. This has not gotten worse recently. Elizabeth Cummings denies shortness of breath at rest or orthopnea.  3. Type 2 diabetes mellitus with other specified complication, without long-term current use of insulin (HCC) Elizabeth Cummings's fasting Bgs run at 116, 118, and 113, and post prandial runs at 134. She was first diagnosed 5 years ago. She recently started on Ozempic 2 mg, which she used 1 time. She is not on other medications.  4. Hypertension associated with diabetes ([place]) Elizabeth Cummings is currently taking hydrochlorothiazide. She is tolerating medication(s) well without side effects.  Medication compliance is good and patient appears to be taking it as prescribed.  Denies additional concerns regarding this condition.  Her blood pressure is at goal today.  5. Mixed diabetic hyperlipidemia associated with type 2 diabetes mellitus ([place]) Elizabeth Cummings is currently on Pravachol. She is tolerating medication(s) well without side effects.  Medication compliance is good and patient appears to be taking it as prescribed.  Denies additional concerns regarding this condition.   6. Gluten-sensitive enteropathy  Elizabeth Cummings is able to eat gluten, but just cant have the same foods with gluten repeatedly. She states she needs more freedom with food choices.   7. At risk for heart disease Elizabeth Cummings is at a higher than average risk for cardiovascular disease due to diabetes mellitus,  hypertension, and hyperlipidemia.  Assessment/Plan:   Orders Placed This Encounter  Procedures   Vitamin B12   CBC with Differential/Platelet   Comprehensive metabolic panel   Folate   Insulin, random   Lipid Panel With LDL/HDL Ratio   T4, free   TSH   VITAMIN D 25 Hydroxy (Vit-D Deficiency, Fractures)   Hemoglobin A1c   EKG 12-Lead    Medications Discontinued During This Encounter  Medication Reason   AZELASTINE-FLUTICASONE NA    fluocinolone (VANOS) 0.01 % cream    benzonatate (TESSALON PERLES) 100 MG capsule    metroNIDAZOLE (METROGEL) 0.75 % gel    azelastine (ASTELIN) 0.1 % nasal spray Error     No orders of the defined types were placed in this encounter.    1. Other fatigue Elizabeth Cummings does feel that her weight is causing her energy to be lower than it should be. Fatigue may be related to Elizabeth, depression or many other causes. Labs will be ordered, and in the meanwhile, Elizabeth Cummings will focus on self care including making healthy food choices, increasing physical activity and focusing on stress reduction.  - Vitamin B12 - EKG 12-Lead - Folate - T4, free - TSH - VITAMIN D 25 Hydroxy (Vit-D Deficiency, Fractures)  2. Shortness of breath on exertion Elizabeth Cummings does feel that she gets out of breath more easily that she used to when she exercises. Elizabeth Cummings's shortness of breath appears to be Elizabeth related and exercise induced. She has agreed to work on weight loss and gradually increase exercise to treat her exercise induced shortness of breath. Will continue to monitor closely.  3. Type 2 diabetes mellitus with other specified complication, without long-term current use of insulin (HCC) We will check labs today. Elizabeth Cummings will continue her medications for now, decrease carbohydrates, and increase protein. She will continue blood sugar monitoring at home. Good blood sugar control is important to decrease the likelihood of diabetic complications such as nephropathy,  neuropathy, limb loss, blindness, coronary artery disease, and death. Intensive lifestyle modification including diet, exercise and weight loss are the first line of treatment for diabetes.   - Vitamin B12 - Insulin, random - Hemoglobin A1c  4. Hypertension associated with diabetes ([place]) We will check labs today. Elizabeth Cummings will continue her medications.  - Counseled Elizabeth Cummings on pathophysiology of disease and discussed treatment plan, which always includes dietary and lifestyle modification as first line.  - Lifestyle changes such as following our low salt, heart healthy meal plan and engaging in a regular exercise program discussed  - Avoid buying foods that are: processed, frozen, or prepackaged to avoid excess salt. - Ambulatory blood pressure monitoring encouraged.  Reminded patient that if they ever feel poorly in any way, to check their blood pressure and pulse as well. - We will continue to monitor closely alongside PCP/ specialists.  Pt reminded to also f/up with those individuals as instructed by them.  - We will continue to monitor symptoms as they relate to the her weight loss journey.  - CBC with Differential/Platelet - Comprehensive metabolic panel  5. Mixed diabetic hyperlipidemia associated with type 2 diabetes mellitus (HCC) Elizabeth Cummings reports compliance with meds and/or treatment plan such as low saturated  and trans fat low cholesterol meal plan. We will check labs today. She will follow her prudent nutritional plan and eventually increase exercise.  - Rec: aerobic activity with eventual goal of a minimum of 150+ min wk plus 2 days/ week of resistance strength training - Cardiovascular risk and specific lipid/LDL goals reviewed.  We discussed several lifestyle modifications today and Jelissa will continue to work on diet, exercise and weight loss efforts.  - Will continue routine screening as patient continues with health goals and weight loss journey  - CBC  with Differential/Platelet - Comprehensive metabolic panel - Lipid Panel With LDL/HDL Ratio  6. Gluten-sensitive enteropathy After a long discussion, we decided that journaling would be best for the patient at this time. We will monitor closely.   7. Screening for depression Zari had a negative depression screening. Depression is commonly associated with Elizabeth and often results in emotional eating behaviors. We will monitor this closely and work on CBT to help improve the non-hunger eating patterns. Referral to Psychology may be required if no improvement is seen as she continues in our clinic.  8. At risk for heart disease Due to Elizabeth Cummings's current state of health and medical condition(s), she is at a higher risk for heart disease.  This puts the patient at much greater risk to subsequently develop cardiopulmonary conditions that can significantly affect patient's quality of life in a negative manner.    At least 23 minutes were spent on counseling Elizabeth Cummings about these concerns today, and I stressed the importance of reversing risks factors of Elizabeth, especially truncal and visceral fat, hypertension, hyperlipidemia, and pre-diabetes.  The initial goal is to lose at least 5-10% of starting weight to help reduce these risk factors.  Counseling:  Intensive lifestyle modifications were discussed with Elizabeth Cummings as the most appropriate first line of treatment.  she will continue to work on diet, exercise, and weight loss efforts.  We will continue to reassess these conditions on a fairly regular basis in an attempt to decrease the patient's overall morbidity and mortality.  Evidence-based interventions for health behavior change were utilized today including the discussion of self monitoring techniques, problem-solving barriers, and SMART goal setting techniques.  Specifically, regarding patient's less desirable eating habits and patterns, we employed the technique of small changes when Elizeth has  not been able to fully commit to her prudent nutritional plan.  9. Elizabeth with current BMI of 36.6 Elizabeth Cummings is currently in the action stage of change and her goal is to continue with weight loss efforts. I recommend Elizabeth Cummings begin the structured treatment plan as follows:  She has agreed to keeping a food journal and adhering to recommended goals of 1200-1300 calories and 90 grams of protein daily.  Exercise goals: As is.   Behavioral modification strategies: increasing lean protein intake, decreasing simple carbohydrates, keeping healthy foods in the home, and planning for success.  She was informed of the importance of frequent follow-up visits to maximize her success with intensive lifestyle modifications for her multiple health conditions. She was informed we would discuss her lab results at her next visit unless there is a critical issue that needs to be addressed sooner. Kyiesha agreed to keep her next visit at the agreed upon time to discuss these results.  Objective:   Blood pressure 127/79, pulse 79, temperature 98.2 F (36.8 C), height 5' 4"  (1.626 m), weight 213 lb (96.6 kg), last menstrual period 11/12/2020, SpO2 96 %. Body mass index is 36.56 kg/m.  EKG: Normal sinus  rhythm, rate 85 BPM.  Indirect Calorimeter completed today shows a VO2 of 265 and a REE of 1829.  Her calculated basal metabolic rate is 2122 thus her basal metabolic rate is better than expected.  General: Cooperative, alert, well developed, in no acute distress. HEENT: Conjunctivae and lids unremarkable. Cardiovascular: Regular rhythm.  Lungs: Normal work of breathing. Neurologic: No focal deficits.   Lab Results  Component Value Date   CREATININE 0.66 11/14/2020   BUN 11 11/14/2020   NA 141 11/14/2020   K 4.0 11/14/2020   CL 100 11/14/2020   CO2 21 11/14/2020   Lab Results  Component Value Date   ALT 22 11/14/2020   AST 38 11/14/2020   ALKPHOS 111 11/14/2020   BILITOT 0.3 11/14/2020   Lab  Results  Component Value Date   HGBA1C 6.4 (H) 11/14/2020   HGBA1C 6.4 12/27/2014   HGBA1C 6.3 09/06/2014   Lab Results  Component Value Date   INSULIN 20.3 11/14/2020   Lab Results  Component Value Date   TSH 0.785 11/14/2020   Lab Results  Component Value Date   CHOL 172 11/14/2020   HDL 42 11/14/2020   LDLCALC 106 (H) 11/14/2020   TRIG 134 11/14/2020   Lab Results  Component Value Date   WBC 12.1 (H) 11/14/2020   HGB 12.0 11/14/2020   HCT 36.6 11/14/2020   MCV 83 11/14/2020   PLT 545 (H) 11/14/2020   Lab Results  Component Value Date   IRON 58 05/08/2014   TIBC 506 (H) 05/08/2014   FERRITIN 36 05/08/2014   Attestation Statements:   Reviewed by clinician on day of visit: allergies, medications, problem list, medical history, surgical history, family history, social history, and previous encounter notes.   Wilhemena Durie, am acting as transcriptionist for [employer], DO.  I have reviewed the above documentation for accuracy and completeness, and I agree with the above. Marjory Sneddon, D.O.  The [place] was signed into law in 2016 which includes the topic of electronic health records.  This provides immediate access to information in MyChart.  This includes consultation notes, operative notes, office notes, lab results and pathology reports.  If you have any questions about what you read please let us know at your next visit so we can discuss your concerns and take corrective action if need be.  We are right here with you.

## 2020-11-20 ENCOUNTER — Ambulatory Visit (INDEPENDENT_AMBULATORY_CARE_PROVIDER_SITE_OTHER): Payer: Self-pay | Admitting: Family Medicine

## 2020-11-21 ENCOUNTER — Ambulatory Visit (INDEPENDENT_AMBULATORY_CARE_PROVIDER_SITE_OTHER): Payer: Self-pay | Admitting: Family Medicine

## 2020-11-28 ENCOUNTER — Other Ambulatory Visit: Payer: Self-pay

## 2020-11-28 ENCOUNTER — Ambulatory Visit (INDEPENDENT_AMBULATORY_CARE_PROVIDER_SITE_OTHER): Payer: 59 | Admitting: Family Medicine

## 2020-11-28 VITALS — BP 119/67 | HR 90 | Temp 98.2°F | Ht 64.0 in | Wt 213.0 lb

## 2020-11-28 DIAGNOSIS — Z9189 Other specified personal risk factors, not elsewhere classified: Secondary | ICD-10-CM

## 2020-11-28 DIAGNOSIS — R7989 Other specified abnormal findings of blood chemistry: Secondary | ICD-10-CM

## 2020-11-28 DIAGNOSIS — E1159 Type 2 diabetes mellitus with other circulatory complications: Secondary | ICD-10-CM

## 2020-11-28 DIAGNOSIS — E1169 Type 2 diabetes mellitus with other specified complication: Secondary | ICD-10-CM

## 2020-11-28 DIAGNOSIS — Z6836 Body mass index (BMI) 36.0-36.9, adult: Secondary | ICD-10-CM | POA: Diagnosis not present

## 2020-11-28 DIAGNOSIS — I152 Hypertension secondary to endocrine disorders: Secondary | ICD-10-CM

## 2020-11-28 DIAGNOSIS — E785 Hyperlipidemia, unspecified: Secondary | ICD-10-CM

## 2020-11-28 DIAGNOSIS — E559 Vitamin D deficiency, unspecified: Secondary | ICD-10-CM

## 2020-12-03 NOTE — Progress Notes (Signed)
Chief Complaint:   OBESITY Elizabeth Cummings is here to discuss her progress with her obesity treatment plan along with follow-up of her obesity related diagnoses. Elizabeth Cummings is on keeping a food journal and adhering to recommended goals of 1200-1300 calories and 90 grams of protein daily and states she is following her eating plan approximately 30% of the time. Elizabeth Cummings states she is doing 0 minutes 0 times per week.  Today's visit was #: 2 Starting weight: 213 lbs Starting date: 11/14/2020 Today's weight: 213 lbs Today's date: 11/28/2020 Total lbs lost to date: 0 Total lbs lost since last in-office visit: 0  Interim History: Elizabeth Cummings is here today for her first follow-up office visit since starting the program with [place].  All blood work/ lab tests that were recently ordered by myself or an outside provider ( PCP;  Dr Brigitte Pulse of [place])  were reviewed with patient today per their request.  Extended time was spent counseling her on all new disease processes that were discovered or preexisting ones that are affected by BMI.  she understands that many of these abnormalities will need to monitored regularly along with the current treatment plan of prudent dietary changes, in which we are making each and every office visit, to improve these health parameters.   Shahira followed her meal plan at breakfast most days, but she wasn't able to journal at all. No concerns/ questions about the plan. She drinks 2 bottles of water per day most days.  Subjective:   1. Type 2 diabetes mellitus with other specified complication, without long-term current use of insulin (HCC) Elizabeth Cummings's fasting BGs range between 90's and 100. Her last A1c was 6.4, and prior A1c in June was 6.5, and prior to that 7.0. I discussed labs with the patient today.  2. Hypertension associated with diabetes ([place]) Cardiovascular ROS: no chest pain or dyspnea on exertion. Elizabeth Cummings is taking hydrochlorothiazide.  No ARB or ACE  I discussed  labs with the patient today.  3. Hyperlipidemia associated with type 2 diabetes mellitus ([place]) Elizabeth Cummings has hyperlipidemia and has been trying to improve her cholesterol levels with intensive lifestyle modification including a low saturated fat diet, exercise and weight loss. She denies any chest pain, claudication or myalgias. I discussed labs with the patient today.  4. Vitamin D deficiency Elizabeth Cummings is currently taking OTC vitamin D 500-200 mg each day. She denies nausea, vomiting or muscle weakness. I discussed labs with the patient today.  5. Elevated platelet count Elizabeth Cummings was seen by Hematology-Oncology 6 years ago for elevated platelet and white blood cells. Last labs are within normal limits with no abnormalities. I discussed labs with the patient today.  6. At risk for dehydration Elizabeth Cummings is at risk for dehydration due to poor liquid intake.  Assessment/Plan:   Medications Discontinued During This Encounter  Medication Reason   hydrochlorothiazide (HYDRODIURIL) 25 MG tablet Error   levocetirizine (XYZAL) 5 MG tablet Error   pravastatin (PRAVACHOL) 20 MG tablet Error     1. Type 2 diabetes mellitus with other specified complication, without long-term current use of insulin (HCC) Elizabeth Cummings's labs are at goal essentially. She will continue Ozempic at 2 mg weekly. Good blood sugar control is important to decrease the likelihood of diabetic complications such as nephropathy, neuropathy, limb loss, blindness, coronary artery disease, and death. Intensive lifestyle modification including diet, exercise and weight loss are the first line of treatment for diabetes.   2. Hypertension associated with diabetes ([place]) Elizabeth Cummings's blood pressure is  at goal. She is not on ACE or ARB, and she will continue her prudent nutritional plan and weight loss.  3. Hyperlipidemia associated with type 2 diabetes mellitus (Elizabeth Cummings) Cardiovascular risk and specific lipid/LDL goals reviewed.  We discussed  several lifestyle modifications today. Elizabeth Cummings agreed to increase from twice weekly Pravchol to 3 nights per week since her LDL is not at goal. She will continue to work on diet, exercise and weight loss efforts. Orders and follow up as documented in patient record.   Counseling Intensive lifestyle modifications are the first line treatment for this issue. Dietary changes: Increase soluble fiber. Decrease simple carbohydrates. Exercise changes: Moderate to vigorous-intensity aerobic activity 150 minutes per week if tolerated. Lipid-lowering medications: see documented in medical record.  4. Vitamin D deficiency Low Vitamin D level contributes to fatigue and are associated with obesity, breast, and colon cancer. Elizabeth Cummings's Vit D level is at goal. She will continue OTC Vitamin D, and will follow-up for routine testing of Vitamin D, at least 2-3 times per year to avoid over-replacement.  5. Elevated platelet count Elizabeth Cummings's labs are stable, and she will continue to follow up as directed.  6. At risk for dehydration Elizabeth Cummings is at higher than average risk of dehydration.  Elizabeth Cummings was given more than 9 minutes of proper hydration counseling today.  We discussed the signs and symptoms of dehydration, some of which may include muscle cramping, constipation or even orthostatic symptoms.  Counseling on the prevention of dehydration was also provided today.  Elizabeth Cummings is at risk for dehydration due to weight loss, lifestyle and behavorial habits and possibly due to taking certain medication(s).  She was encouraged to adequately hydrate and monitor fluid status to avoid dehydration as well as weight loss plateaus.  Unless pre-existing renal or cardiopulmonary conditions exist, in which patient was told to limit their fluid intake, I recommended roughly one half of their weight in pounds to be the approximate ounces of non-caloric, non-caffeinated beverages they should drink per day; including more if they are  engaging in exercise.  7. Obesity with current BMI of 36.6 Elizabeth Cummings is currently in the action stage of change. As such, her goal is to continue with weight loss efforts. She has agreed to keeping a food journal and adhering to recommended goals of 1200-1300 calories and 90 grams of protein daily.   Exercise goals: As is.  Behavioral modification strategies: keeping healthy foods in the home, planning for success, and keeping a strict food journal.  Elizabeth Cummings has agreed to follow-up with our clinic in 3 weeks. She was informed of the importance of frequent follow-up visits to maximize her success with intensive lifestyle modifications for her multiple health conditions.   Objective:   Blood pressure 119/67, pulse 90, temperature 98.2 F (36.8 C), height 5' 4"  (1.626 m), weight 213 lb (96.6 kg), last menstrual period 11/12/2020, SpO2 97 %. Body mass index is 36.56 kg/m.  General: Cooperative, alert, well developed, in no acute distress. HEENT: Conjunctivae and lids unremarkable. Cardiovascular: Regular rhythm.  Lungs: Normal work of breathing. Neurologic: No focal deficits.   Lab Results  Component Value Date   CREATININE 0.66 11/14/2020   BUN 11 11/14/2020   NA 141 11/14/2020   K 4.0 11/14/2020   CL 100 11/14/2020   CO2 21 11/14/2020   Lab Results  Component Value Date   ALT 22 11/14/2020   AST 38 11/14/2020   ALKPHOS 111 11/14/2020   BILITOT 0.3 11/14/2020   Lab Results  Component Value  Date   HGBA1C 6.4 (H) 11/14/2020   HGBA1C 6.4 12/27/2014   HGBA1C 6.3 09/06/2014   Lab Results  Component Value Date   INSULIN 20.3 11/14/2020   Lab Results  Component Value Date   TSH 0.785 11/14/2020   Lab Results  Component Value Date   CHOL 172 11/14/2020   HDL 42 11/14/2020   LDLCALC 106 (H) 11/14/2020   TRIG 134 11/14/2020   Lab Results  Component Value Date   VD25OH 50.9 11/14/2020   Lab Results  Component Value Date   WBC 12.1 (H) 11/14/2020   HGB 12.0  11/14/2020   HCT 36.6 11/14/2020   MCV 83 11/14/2020   PLT 545 (H) 11/14/2020   Lab Results  Component Value Date   IRON 58 05/08/2014   TIBC 506 (H) 05/08/2014   FERRITIN 36 05/08/2014   Attestation Statements:   Reviewed by clinician on day of visit: allergies, medications, problem list, medical history, surgical history, family history, social history, and previous encounter notes.   Wilhemena Durie, am acting as transcriptionist for [employer], DO.  I have reviewed the above documentation for accuracy and completeness, and I agree with the above. Marjory Sneddon, D.O.  The [place] was signed into law in 2016 which includes the topic of electronic health records.  This provides immediate access to information in MyChart.  This includes consultation notes, operative notes, office notes, lab results and pathology reports.  If you have any questions about what you read please let us know at your next visit so we can discuss your concerns and take corrective action if need be.  We are right here with you.

## 2020-12-11 DIAGNOSIS — M9903 Segmental and somatic dysfunction of lumbar region: Secondary | ICD-10-CM | POA: Diagnosis not present

## 2020-12-11 DIAGNOSIS — M62830 Muscle spasm of back: Secondary | ICD-10-CM | POA: Diagnosis not present

## 2020-12-12 DIAGNOSIS — M62830 Muscle spasm of back: Secondary | ICD-10-CM | POA: Diagnosis not present

## 2020-12-12 DIAGNOSIS — M9903 Segmental and somatic dysfunction of lumbar region: Secondary | ICD-10-CM | POA: Diagnosis not present

## 2020-12-16 DIAGNOSIS — M9903 Segmental and somatic dysfunction of lumbar region: Secondary | ICD-10-CM | POA: Diagnosis not present

## 2020-12-16 DIAGNOSIS — M62830 Muscle spasm of back: Secondary | ICD-10-CM | POA: Diagnosis not present

## 2020-12-19 ENCOUNTER — Ambulatory Visit (INDEPENDENT_AMBULATORY_CARE_PROVIDER_SITE_OTHER): Payer: 59 | Admitting: Family Medicine

## 2020-12-25 DIAGNOSIS — M62830 Muscle spasm of back: Secondary | ICD-10-CM | POA: Diagnosis not present

## 2020-12-25 DIAGNOSIS — M9903 Segmental and somatic dysfunction of lumbar region: Secondary | ICD-10-CM | POA: Diagnosis not present

## 2020-12-30 DIAGNOSIS — M9903 Segmental and somatic dysfunction of lumbar region: Secondary | ICD-10-CM | POA: Diagnosis not present

## 2020-12-30 DIAGNOSIS — M62830 Muscle spasm of back: Secondary | ICD-10-CM | POA: Diagnosis not present

## 2020-12-31 ENCOUNTER — Encounter (INDEPENDENT_AMBULATORY_CARE_PROVIDER_SITE_OTHER): Payer: Self-pay | Admitting: Family Medicine

## 2020-12-31 ENCOUNTER — Ambulatory Visit (INDEPENDENT_AMBULATORY_CARE_PROVIDER_SITE_OTHER): Payer: 59 | Admitting: Family Medicine

## 2020-12-31 ENCOUNTER — Other Ambulatory Visit: Payer: Self-pay

## 2020-12-31 VITALS — BP 115/79 | HR 89 | Temp 98.5°F | Ht 64.0 in | Wt 209.0 lb

## 2020-12-31 DIAGNOSIS — E1169 Type 2 diabetes mellitus with other specified complication: Secondary | ICD-10-CM | POA: Diagnosis not present

## 2021-01-01 DIAGNOSIS — E119 Type 2 diabetes mellitus without complications: Secondary | ICD-10-CM | POA: Insufficient documentation

## 2021-01-01 NOTE — Progress Notes (Signed)
Chief Complaint:   OBESITY Elizabeth Cummings is here to discuss her progress with her obesity treatment plan along with follow-up of her obesity related diagnoses. Elizabeth Cummings is on keeping a food journal and adhering to recommended goals of 1200-1300 calories and 90 grams of protein and states she is following her eating plan approximately 50% of the time. Elizabeth Cummings states she is using a stationary bike for 20 minutes 1 times per week.  Today's visit was #: 3 Starting weight: 213 lbs Starting date: 11/14/2020 Today's weight: 209 lbs Today's date: 12/31/2020 Total lbs lost to date: 4 lbs Total lbs lost since last in-office visit: 4 lbs  Interim History: Elizabeth Cummings recently recovered from Elizabeth Cummings and is now getting back to journaling. She says she tends to  forget to log her food. She does not pack her lunch. 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  She denies intake of sugar sweetened beverages.  Subjective:   1. Type 2 diabetes mellitus with other specified complication, without long-term current use of insulin (HCC) Elizabeth Cummings is on Ozempic 2 mg. She feels her appetite is well controlled. She tolerates it fine. Her A1C is within normal limits. She is on Pravachol.  Lab Results  Component Value Date   HGBA1C 6.4 (H) 11/14/2020   HGBA1C 6.4 12/27/2014   HGBA1C 6.3 09/06/2014   Lab Results  Component Value Date   LDLCALC 106 (H) 11/14/2020   CREATININE 0.66 11/14/2020   Lab Results  Component Value Date   INSULIN 20.3 11/14/2020     Assessment/Plan:   1. Type 2 diabetes mellitus with other specified complication, without long-term current use of insulin (HCC) Elizabeth Cummings will continue Ozempic.   2. Obesity with current BMI of 35.86 Elizabeth Cummings is currently in the action stage of change. As such, her goal is to continue with weight loss efforts. She has agreed to keeping a food journal and adhering to recommended goals of 1200-1300 calories and 90 grams of  protein daily.  Exercise goals: No exercise has been prescribed at this time.  Behavioral modification strategies: keeping a strict food journal.  Elizabeth Cummings has agreed to follow-up with our clinic in 2-3 weeks.  Objective:   Blood pressure 115/79, pulse 89, temperature 98.5 F (36.9 C), height 5' 4"  (1.626 m), weight 209 lb (94.8 kg), SpO2 96 %. Body mass index is 35.87 kg/m.  General: Cooperative, alert, well developed, in no acute distress. HEENT: Conjunctivae and lids unremarkable. Cardiovascular: Regular rhythm.  Lungs: Normal work of breathing. Neurologic: No focal deficits.   Lab Results  Component Value Date   CREATININE 0.66 11/14/2020   BUN 11 11/14/2020   NA 141 11/14/2020   K 4.0 11/14/2020   CL 100 11/14/2020   CO2 21 11/14/2020   Lab Results  Component Value Date   ALT 22 11/14/2020   AST 38 11/14/2020   ALKPHOS 111 11/14/2020   BILITOT 0.3 11/14/2020   Lab Results  Component Value Date   HGBA1C 6.4 (H) 11/14/2020   HGBA1C 6.4 12/27/2014   HGBA1C 6.3 09/06/2014   Lab Results  Component Value Date   INSULIN 20.3 11/14/2020   Lab Results  Component Value Date   TSH 0.785 11/14/2020   Lab Results  Component Value Date   CHOL 172 11/14/2020   HDL 42 11/14/2020   LDLCALC 106 (H) 11/14/2020   TRIG 134 11/14/2020   Lab Results  Component Value Date   VD25OH 50.9 11/14/2020  Lab Results  Component Value Date   WBC 12.1 (H) 11/14/2020   HGB 12.0 11/14/2020   HCT 36.6 11/14/2020   MCV 83 11/14/2020   PLT 545 (H) 11/14/2020   Lab Results  Component Value Date   IRON 58 05/08/2014   TIBC 506 (H) 05/08/2014   FERRITIN 36 05/08/2014   Attestation Statements:   Reviewed by clinician on day of visit: allergies, medications, problem list, medical history, surgical history, family history, social history, and previous encounter notes.   I, Elizabeth Cummings, RMA, am acting as Location manager for [employer], [place].   I have reviewed the above  documentation for accuracy and completeness, and I agree with the above. -  Georgianne Fick, FNP

## 2021-01-02 ENCOUNTER — Other Ambulatory Visit (HOSPITAL_COMMUNITY): Payer: Self-pay

## 2021-01-08 DIAGNOSIS — M62830 Muscle spasm of back: Secondary | ICD-10-CM | POA: Diagnosis not present

## 2021-01-08 DIAGNOSIS — M9903 Segmental and somatic dysfunction of lumbar region: Secondary | ICD-10-CM | POA: Diagnosis not present

## 2021-01-15 DIAGNOSIS — M62830 Muscle spasm of back: Secondary | ICD-10-CM | POA: Diagnosis not present

## 2021-01-15 DIAGNOSIS — M9903 Segmental and somatic dysfunction of lumbar region: Secondary | ICD-10-CM | POA: Diagnosis not present

## 2021-01-21 ENCOUNTER — Ambulatory Visit (INDEPENDENT_AMBULATORY_CARE_PROVIDER_SITE_OTHER): Payer: 59 | Admitting: Family Medicine

## 2021-01-27 ENCOUNTER — Encounter (INDEPENDENT_AMBULATORY_CARE_PROVIDER_SITE_OTHER): Payer: Self-pay | Admitting: Family Medicine

## 2021-01-27 ENCOUNTER — Ambulatory Visit (INDEPENDENT_AMBULATORY_CARE_PROVIDER_SITE_OTHER): Payer: 59 | Admitting: Family Medicine

## 2021-01-27 ENCOUNTER — Other Ambulatory Visit: Payer: Self-pay

## 2021-01-27 VITALS — BP 113/77 | HR 96 | Temp 98.0°F | Ht 64.0 in | Wt 209.0 lb

## 2021-01-27 DIAGNOSIS — E785 Hyperlipidemia, unspecified: Secondary | ICD-10-CM

## 2021-01-27 DIAGNOSIS — E1169 Type 2 diabetes mellitus with other specified complication: Secondary | ICD-10-CM | POA: Diagnosis not present

## 2021-01-27 DIAGNOSIS — Z6836 Body mass index (BMI) 36.0-36.9, adult: Secondary | ICD-10-CM | POA: Diagnosis not present

## 2021-01-27 NOTE — Telephone Encounter (Signed)
Elizabeth 

## 2021-01-27 NOTE — Progress Notes (Signed)
Chief Complaint:   OBESITY Elizabeth Cummings is here to discuss her progress with her obesity treatment plan along with follow-up of her obesity related diagnoses. Elizabeth Cummings is on keeping a food journal and adhering to recommended goals of 1200-1300 calories and 90 grams of protein and states she is following her eating plan approximately 0% of the time. Elizabeth Cummings states she is counting 7,000 steps daily 5 times per week.  Today's visit was #: 4 Starting weight: 213 lbs Starting date: 11/14/2020 Today's weight: 209 lbs Today's date:01/27/2021 Total lbs lost to date: 4 lbs Total lbs lost since last in-office visit: 0  Interim History: Elizabeth Cummings feels she has not adhered well to the plan over the course of time at our clinic. She has not been consistent with journaling. She plans to get serious about the plan over the next few weeks.   Subjective:   1. Type 2 diabetes mellitus with other specified complication, without long-term current use of insulin (HCC) Elizabeth Cummings is on Ozempic 2 mg. She does not feel it works as well for appetite as it used to. Her last A1C was at goal - 6.4. She may want to start the [facility] discussed at last office visit. She would like to discuss starting [facility] with primary care provider.   Lab Results  Component Value Date   HGBA1C 6.4 (H) 11/14/2020   HGBA1C 6.4 12/27/2014   HGBA1C 6.3 09/06/2014   Lab Results  Component Value Date   LDLCALC 106 (H) 11/14/2020   CREATININE 0.66 11/14/2020   Lab Results  Component Value Date   INSULIN 20.3 11/14/2020    2. Hyperlipidemia associated with type 2 diabetes mellitus ([place]) Elizabeth Cummings's last LDL was slightly elevated at 106. Her HDL was low at 42. Her Triglycerides were within normal limits. She is currently on Pravastatin 20 mg.  Lab Results  Component Value Date   CHOL 172 11/14/2020   HDL 42 11/14/2020   LDLCALC 106 (H) 11/14/2020   TRIG 134 11/14/2020   Lab Results  Component Value Date   ALT 22  11/14/2020   AST 38 11/14/2020   ALKPHOS 111 11/14/2020   BILITOT 0.3 11/14/2020   The 10-year ASCVD risk score (Arnett DK, et al., 2019) is: 1.7%   Values used to calculate the score:     Age: 45 years     Sex: Female     Is Non-Hispanic African American: No     Diabetic: Yes     Tobacco smoker: No     Systolic Blood Pressure: 620 mmHg     Is BP treated: No     HDL Cholesterol: 42 mg/dL     Total Cholesterol: 172 mg/dL   Assessment/Plan:   1. Type 2 diabetes mellitus with other specified complication, without long-term current use of insulin (HCC) Elizabeth Cummings will continue Ozempic 2 mg. She will start Darcel Bayley if her primary care provider  is ok with it. We discussed starting Mounjaro could reduce effectiveness of OCP for contraception. Likely this is not an issue given her age.   2. Hyperlipidemia associated with type 2 diabetes mellitus ([place])  We discussed several lifestyle modifications today and Yazmyne will continue to work on diet and weight loss efforts.  .   3. Obesity with current BMI of 35.86 Elizabeth Cummings is currently in the action stage of change. As such, her goal is to continue with weight loss efforts. She has agreed to keeping a food journal and adhering to recommended goals of 1200-1300 calories  and 90 grams of protein daily.    Exercise goals:  As is.  Behavioral modification strategies: increasing lean protein intake and keeping a strict food journal.  Elizabeth Cummings has agreed to follow-up with our clinic in 4 weeks.  Objective:   Blood pressure 113/77, pulse 96, temperature 98 F (36.7 C), height 5' 4"  (1.626 m), weight 209 lb (94.8 kg), SpO2 97 %. Body mass index is 35.87 kg/m.  General: Cooperative, alert, well developed, in no acute distress. HEENT: Conjunctivae and lids unremarkable. Cardiovascular: Regular rhythm.  Lungs: Normal work of breathing. Neurologic: No focal deficits.   Lab Results  Component Value Date   CREATININE 0.66 11/14/2020   BUN 11  11/14/2020   NA 141 11/14/2020   K 4.0 11/14/2020   CL 100 11/14/2020   CO2 21 11/14/2020   Lab Results  Component Value Date   ALT 22 11/14/2020   AST 38 11/14/2020   ALKPHOS 111 11/14/2020   BILITOT 0.3 11/14/2020   Lab Results  Component Value Date   HGBA1C 6.4 (H) 11/14/2020   HGBA1C 6.4 12/27/2014   HGBA1C 6.3 09/06/2014   Lab Results  Component Value Date   INSULIN 20.3 11/14/2020   Lab Results  Component Value Date   TSH 0.785 11/14/2020   Lab Results  Component Value Date   CHOL 172 11/14/2020   HDL 42 11/14/2020   LDLCALC 106 (H) 11/14/2020   TRIG 134 11/14/2020   Lab Results  Component Value Date   VD25OH 50.9 11/14/2020   Lab Results  Component Value Date   WBC 12.1 (H) 11/14/2020   HGB 12.0 11/14/2020   HCT 36.6 11/14/2020   MCV 83 11/14/2020   PLT 545 (H) 11/14/2020   Lab Results  Component Value Date   IRON 58 05/08/2014   TIBC 506 (H) 05/08/2014   FERRITIN 36 05/08/2014   Attestation Statements:   Reviewed by clinician on day of visit: allergies, medications, problem list, medical history, surgical history, family history, social history, and previous encounter notes.  Time spent on visit including pre-visit chart review and post-visit care and charting was 31 minutes.   I, Lizbeth Bark, RMA, am acting as Location manager for [employer], [place].    I have reviewed the above documentation for accuracy and completeness, and I agree with the above. -  Georgianne Fick, FNP

## 2021-01-28 DIAGNOSIS — E785 Hyperlipidemia, unspecified: Secondary | ICD-10-CM | POA: Insufficient documentation

## 2021-01-28 DIAGNOSIS — E1169 Type 2 diabetes mellitus with other specified complication: Secondary | ICD-10-CM | POA: Insufficient documentation

## 2021-01-29 ENCOUNTER — Encounter (INDEPENDENT_AMBULATORY_CARE_PROVIDER_SITE_OTHER): Payer: Self-pay | Admitting: Family Medicine

## 2021-01-29 DIAGNOSIS — E1169 Type 2 diabetes mellitus with other specified complication: Secondary | ICD-10-CM

## 2021-01-30 NOTE — Telephone Encounter (Signed)
Dawn 

## 2021-01-30 NOTE — Telephone Encounter (Signed)
Pt last seen by Dawn Whitmire, FNP.  

## 2021-01-31 ENCOUNTER — Other Ambulatory Visit (HOSPITAL_COMMUNITY): Payer: Self-pay

## 2021-01-31 MED ORDER — TIRZEPATIDE 7.5 MG/0.5ML ~~LOC~~ SOAJ
7.5000 mg | SUBCUTANEOUS | 0 refills | Status: DC
  Filled 2021-01-31: qty 6, 84d supply, fill #0

## 2021-01-31 NOTE — Telephone Encounter (Signed)
Spoke with pt on phone. I will send 7.5 mg Mounjaro to pharmacy.

## 2021-02-05 ENCOUNTER — Other Ambulatory Visit (HOSPITAL_COMMUNITY): Payer: Self-pay

## 2021-02-05 MED FILL — Norethindrone-Eth Estradiol Tab 0.5-35/0.75-35/1-35 MG-MCG: ORAL | 84 days supply | Qty: 84 | Fill #2 | Status: AC

## 2021-02-18 ENCOUNTER — Other Ambulatory Visit (HOSPITAL_COMMUNITY): Payer: Self-pay

## 2021-02-18 DIAGNOSIS — J3089 Other allergic rhinitis: Secondary | ICD-10-CM | POA: Diagnosis not present

## 2021-02-18 DIAGNOSIS — L503 Dermatographic urticaria: Secondary | ICD-10-CM | POA: Diagnosis not present

## 2021-02-18 DIAGNOSIS — J3081 Allergic rhinitis due to animal (cat) (dog) hair and dander: Secondary | ICD-10-CM | POA: Diagnosis not present

## 2021-02-18 DIAGNOSIS — J301 Allergic rhinitis due to pollen: Secondary | ICD-10-CM | POA: Diagnosis not present

## 2021-02-18 MED ORDER — AZELASTINE HCL 0.1 % NA SOLN
1.0000 | Freq: Two times a day (BID) | NASAL | 5 refills | Status: DC
  Filled 2021-02-18 – 2021-04-16 (×2): qty 30, 25d supply, fill #0
  Filled 2021-04-16: qty 90, 75d supply, fill #0

## 2021-02-25 ENCOUNTER — Ambulatory Visit (INDEPENDENT_AMBULATORY_CARE_PROVIDER_SITE_OTHER): Payer: 59 | Admitting: Family Medicine

## 2021-02-25 ENCOUNTER — Encounter (INDEPENDENT_AMBULATORY_CARE_PROVIDER_SITE_OTHER): Payer: Self-pay | Admitting: Family Medicine

## 2021-02-25 ENCOUNTER — Other Ambulatory Visit: Payer: Self-pay

## 2021-02-25 VITALS — BP 106/72 | HR 93 | Temp 98.1°F | Ht 64.0 in | Wt 206.0 lb

## 2021-02-25 DIAGNOSIS — Z6836 Body mass index (BMI) 36.0-36.9, adult: Secondary | ICD-10-CM

## 2021-02-25 DIAGNOSIS — E1169 Type 2 diabetes mellitus with other specified complication: Secondary | ICD-10-CM | POA: Diagnosis not present

## 2021-02-25 NOTE — Progress Notes (Signed)
Chief Complaint:   OBESITY Elizabeth Cummings is here to discuss her progress with her obesity treatment plan along with follow-up of her obesity related diagnoses. Elizabeth Cummings is on keeping a food journal and adhering to recommended goals of 1200-1300 calories and 90 grams of protein and states she is following her eating plan approximately 70% of the time. Elizabeth Cummings states she is doing 0 minutes 0 times per week.  Today's visit was #: 5 Starting weight: 213 lbs Starting date: 11/14/2020 Today's weight: 206 lbs Today's date: 02/25/2021 Total lbs lost to date: 7 lbs Total lbs lost since last in-office visit: 3 lbs  Interim History: Elizabeth Cummings is journaling about 5 days per week. She doesn't do as well on weekends on plan. She only eats 2 meals per day on weekends and tends to eat out quite a bit. She has sugar sweetened beverage one time per week.   Subjective:   1. Type 2 diabetes mellitus with other specified complication, without long-term current use of insulin (HCC) Elizabeth Cummings's Type 2 diabetes mellitus is well controlled. We switched her to [facility] from [place]. She is tolerating it well. She feels it is working well for appetite suppression.  Lab Results  Component Value Date   HGBA1C 6.4 (H) 11/14/2020   HGBA1C 6.4 12/27/2014   HGBA1C 6.3 09/06/2014   Lab Results  Component Value Date   LDLCALC 106 (H) 11/14/2020   CREATININE 0.66 11/14/2020   Lab Results  Component Value Date   INSULIN 20.3 11/14/2020    Assessment/Plan:   1. Type 2 diabetes mellitus with other specified complication, without long-term current use of insulin (HCC) Elizabeth Cummings will continue Mounjaro 7.5 mg.   2. Obesity with current BMI of 35.34 Elizabeth Cummings is currently in the action stage of change. As such, her goal is to continue with weight loss efforts. She has agreed to keeping a food journal and adhering to recommended goals of 1200-1300 calories and 90 grams of protein daily.  Elizabeth Cummings will increase  journaling to 6 days per week.  Exercise goals: No exercise has been prescribed at this time.  Behavioral modification strategies: decreasing eating out and keeping a strict food journal.  Iman has agreed to follow-up with our clinic in 4 weeks.  Objective:   Blood pressure 106/72, pulse 93, temperature 98.1 F (36.7 C), height 5' 4"  (1.626 m), weight 206 lb (93.4 kg), SpO2 96 %. Body mass index is 35.36 kg/m.  General: Cooperative, alert, well developed, in no acute distress. HEENT: Conjunctivae and lids unremarkable. Cardiovascular: Regular rhythm.  Lungs: Normal work of breathing. Neurologic: No focal deficits.   Lab Results  Component Value Date   CREATININE 0.66 11/14/2020   BUN 11 11/14/2020   NA 141 11/14/2020   K 4.0 11/14/2020   CL 100 11/14/2020   CO2 21 11/14/2020   Lab Results  Component Value Date   ALT 22 11/14/2020   AST 38 11/14/2020   ALKPHOS 111 11/14/2020   BILITOT 0.3 11/14/2020   Lab Results  Component Value Date   HGBA1C 6.4 (H) 11/14/2020   HGBA1C 6.4 12/27/2014   HGBA1C 6.3 09/06/2014   Lab Results  Component Value Date   INSULIN 20.3 11/14/2020   Lab Results  Component Value Date   TSH 0.785 11/14/2020   Lab Results  Component Value Date   CHOL 172 11/14/2020   HDL 42 11/14/2020   LDLCALC 106 (H) 11/14/2020   TRIG 134 11/14/2020   Lab Results  Component Value Date  VD25OH 50.9 11/14/2020   Lab Results  Component Value Date   WBC 12.1 (H) 11/14/2020   HGB 12.0 11/14/2020   HCT 36.6 11/14/2020   MCV 83 11/14/2020   PLT 545 (H) 11/14/2020   Lab Results  Component Value Date   IRON 58 05/08/2014   TIBC 506 (H) 05/08/2014   FERRITIN 36 05/08/2014   Attestation Statements:   Reviewed by clinician on day of visit: allergies, medications, problem list, medical history, surgical history, family history, social history, and previous encounter notes.  I, Elizabeth Cummings, RMA, am acting as Location manager for [employer],  [place].   I have reviewed the above documentation for accuracy and completeness, and I agree with the above. -  Elizabeth Fick, FNP

## 2021-03-03 ENCOUNTER — Encounter (INDEPENDENT_AMBULATORY_CARE_PROVIDER_SITE_OTHER): Payer: Self-pay

## 2021-03-14 ENCOUNTER — Other Ambulatory Visit (HOSPITAL_COMMUNITY): Payer: Self-pay

## 2021-03-14 DIAGNOSIS — Z794 Long term (current) use of insulin: Secondary | ICD-10-CM | POA: Diagnosis not present

## 2021-03-14 DIAGNOSIS — E669 Obesity, unspecified: Secondary | ICD-10-CM | POA: Diagnosis not present

## 2021-03-14 DIAGNOSIS — D473 Essential (hemorrhagic) thrombocythemia: Secondary | ICD-10-CM | POA: Diagnosis not present

## 2021-03-14 DIAGNOSIS — D72829 Elevated white blood cell count, unspecified: Secondary | ICD-10-CM | POA: Diagnosis not present

## 2021-03-14 DIAGNOSIS — E7800 Pure hypercholesterolemia, unspecified: Secondary | ICD-10-CM | POA: Diagnosis not present

## 2021-03-14 DIAGNOSIS — E1169 Type 2 diabetes mellitus with other specified complication: Secondary | ICD-10-CM | POA: Diagnosis not present

## 2021-03-14 LAB — BASIC METABOLIC PANEL
BUN: 10 (ref 4–21)
CO2: 28 — AB (ref 13–22)
Chloride: 99 (ref 99–108)
Creatinine: 0.7 (ref 0.5–1.1)
Glucose: 75
Potassium: 3.6 (ref 3.4–5.3)
Sodium: 139 (ref 137–147)

## 2021-03-14 LAB — CBC AND DIFFERENTIAL
HCT: 38 (ref 36–46)
Hemoglobin: 12.7 (ref 12.0–16.0)
Platelets: 419 — AB (ref 150–399)
WBC: 13.2

## 2021-03-14 LAB — CBC: RBC: 4.67 (ref 3.87–5.11)

## 2021-03-14 LAB — LIPID PANEL
Cholesterol: 156 (ref 0–200)
HDL: 49 (ref 35–70)
LDL Cholesterol: 86
Triglycerides: 121 (ref 40–160)

## 2021-03-14 LAB — COMPREHENSIVE METABOLIC PANEL
Albumin: 4.3 (ref 3.5–5.0)
Calcium: 9.6 (ref 8.7–10.7)
GFR calc non Af Amer: 102

## 2021-03-14 LAB — HEMOGLOBIN A1C: Hemoglobin A1C: 6.6

## 2021-03-14 LAB — HEPATIC FUNCTION PANEL
ALT: 17 (ref 7–35)
AST: 17 (ref 13–35)
Alkaline Phosphatase: 97 (ref 25–125)
Bilirubin, Total: 0.6

## 2021-03-14 MED ORDER — MOUNJARO 10 MG/0.5ML ~~LOC~~ SOAJ
10.0000 mg | SUBCUTANEOUS | 6 refills | Status: DC
  Filled 2021-03-14: qty 2, 28d supply, fill #0
  Filled 2021-04-16: qty 2, 28d supply, fill #1

## 2021-03-18 ENCOUNTER — Other Ambulatory Visit (HOSPITAL_COMMUNITY): Payer: Self-pay | Admitting: Family Medicine

## 2021-03-18 ENCOUNTER — Ambulatory Visit (HOSPITAL_COMMUNITY)
Admission: RE | Admit: 2021-03-18 | Discharge: 2021-03-18 | Disposition: A | Discharge status: Home / Self Care | Payer: Self-pay | Source: Ambulatory Visit | Attending: Family Medicine | Admitting: Family Medicine

## 2021-03-18 ENCOUNTER — Other Ambulatory Visit: Payer: Self-pay

## 2021-03-18 DIAGNOSIS — E1169 Type 2 diabetes mellitus with other specified complication: Secondary | ICD-10-CM | POA: Insufficient documentation

## 2021-03-18 IMAGING — CT CT CARDIAC CORONARY ARTERY CALCIUM SCORE
2 series · 15 of 20 positions shown, 17 images · non-contrast
Comparison: None.
COMPARISON: None.

Addendum:
EXAM:
OVER-READ INTERPRETATION  CT CHEST

The following report is an over-read performed by radiologist Dr.
over-read does not include interpretation of cardiac or coronary
anatomy or pathology. The coronary calcium score interpretation by
the cardiologist is attached. Limited imaging of the chest includes
mid chest and cardiac structures only.
CLINICAL DATA: Cardiovascular Disease Risk stratification
Coronary Calcium Score
TECHNIQUE: A gated, non-contrast computed tomography scan of the heart was
performed using 3mm slice thickness. Axial images were analyzed on a
dedicated workstation. Calcium scoring of the coronary arteries was
performed using the Agatston method.

[Series 3: cascseq 2.0 b35f 70% · axial · 0.39mm/px · z∈[+1052,+1142]mm · 7 of 69 slices shown]
[im 8/69  vessel]
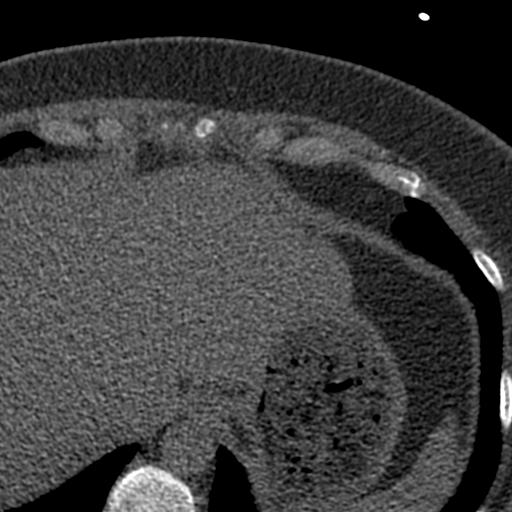
[im 16/69  vessel]
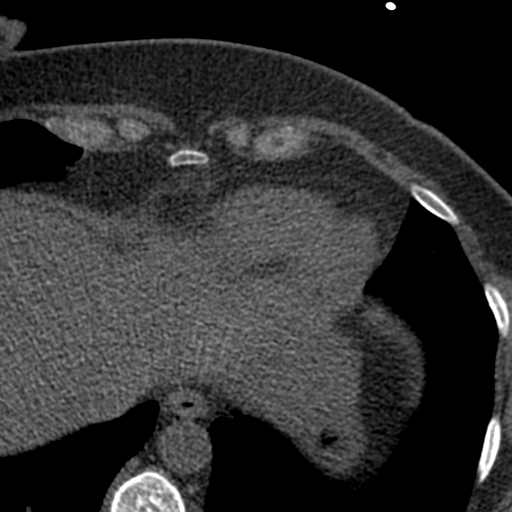
[im 23/69  vessel]
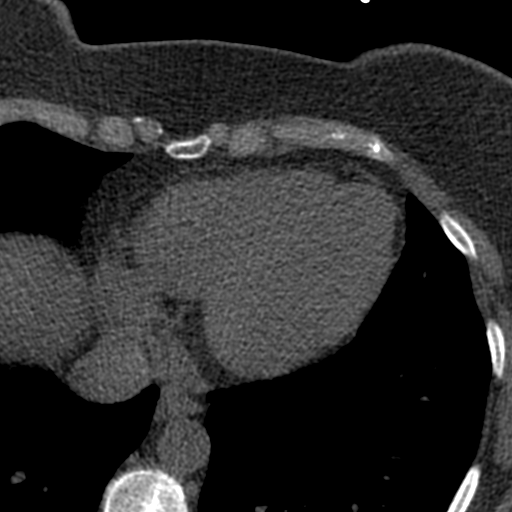
[im 31/69  vessel]
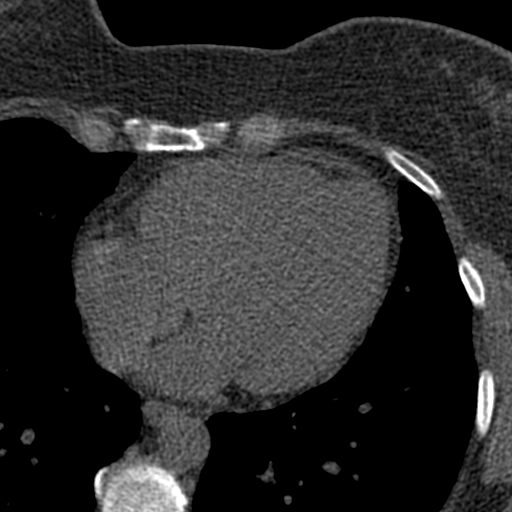
[im 38/69  vessel]
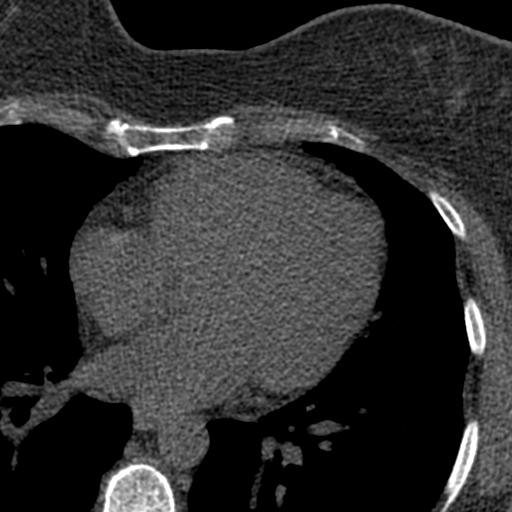
[im 46/69  vessel]
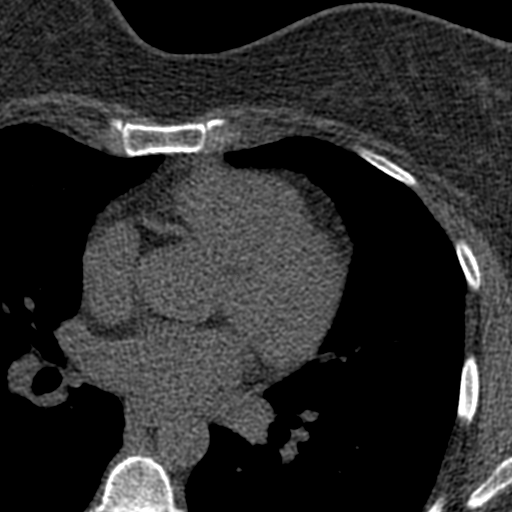
[im 53/69  vessel]
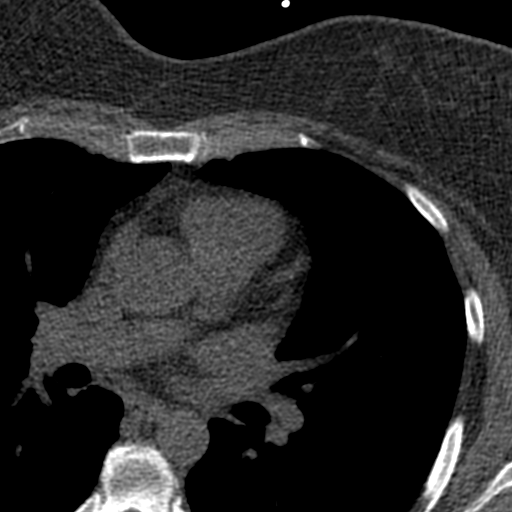

[Series 4: ax st full fov · axial · 0.66mm/px · z∈[+1052,+1158]mm · 8 of 69 slices shown, 10 images]
[im 8/69  vessel]
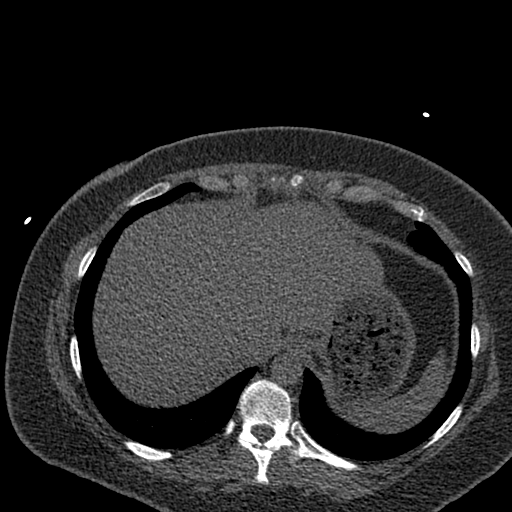
[im 8/69  lung]
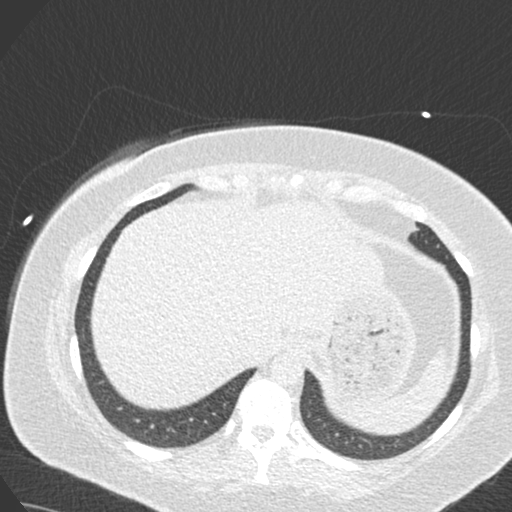
[im 16/69  vessel]
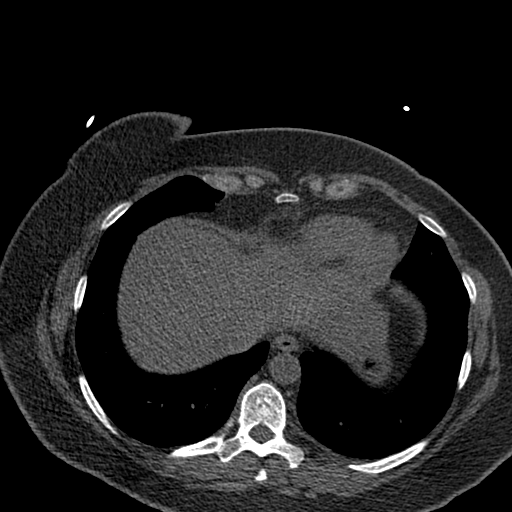
[im 23/69  vessel]
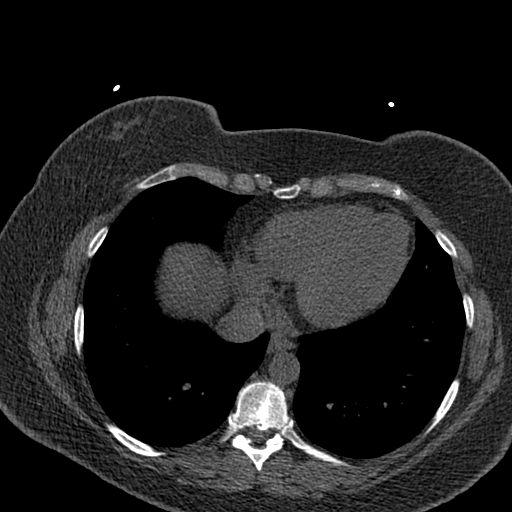
[im 31/69  vessel]
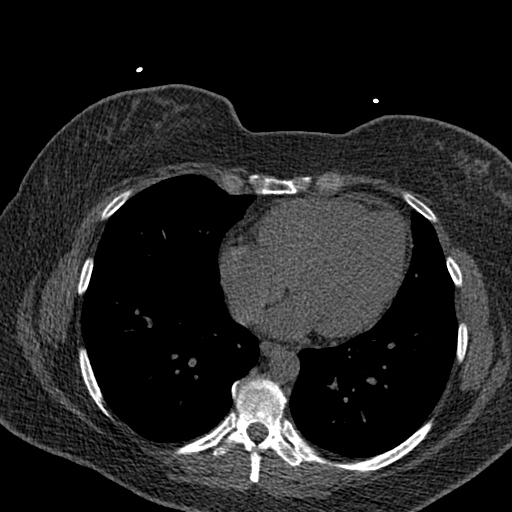
[im 38/69  vessel]
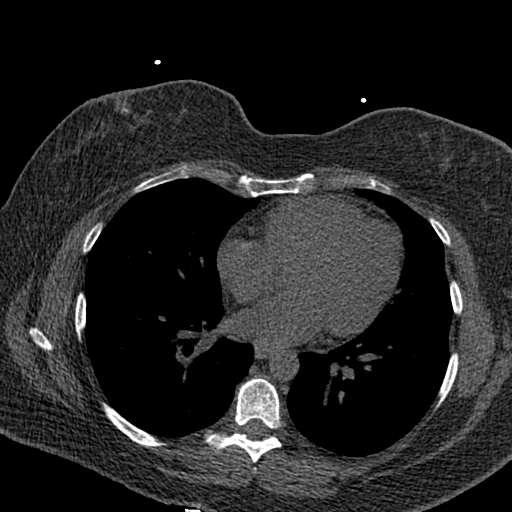
[im 38/69  lung]
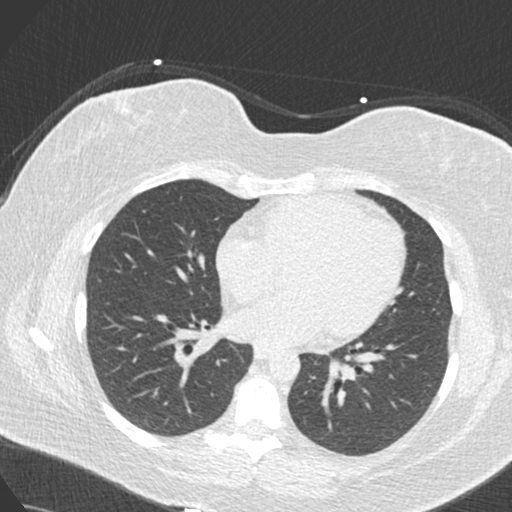
[im 46/69  vessel]
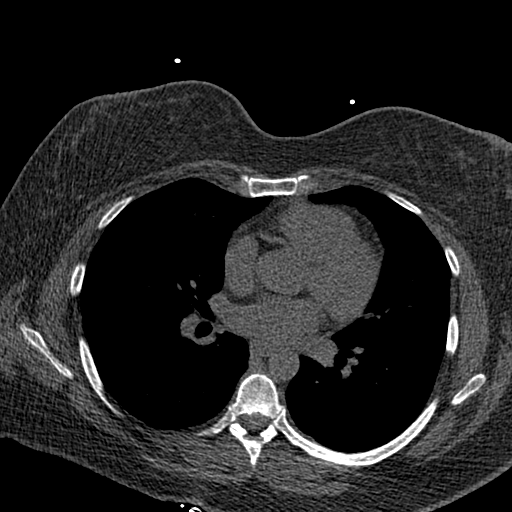
[im 53/69  vessel]
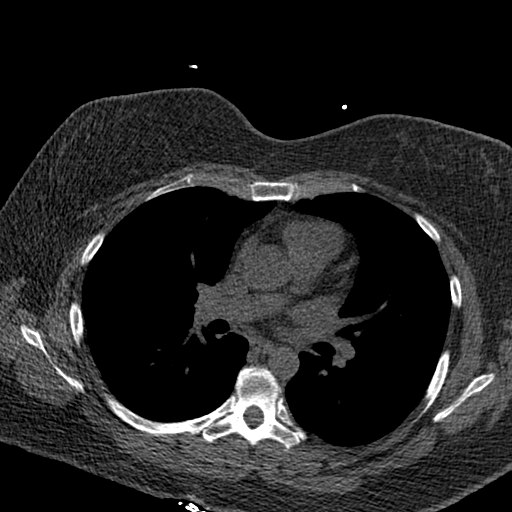
[im 61/69  vessel]
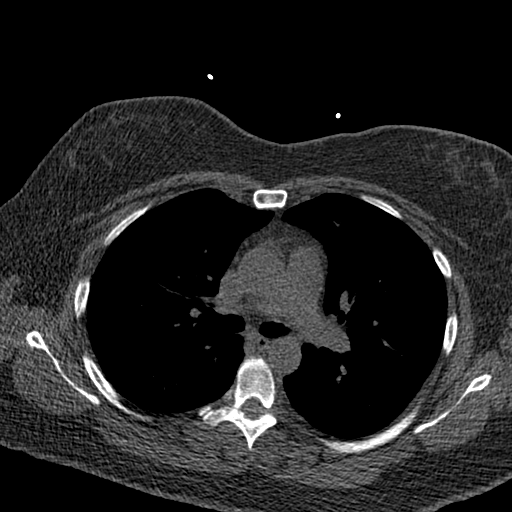

[15 of 20 positions shown; findings below may reference images not displayed]

FINDINGS: Cardiovascular: Please see dedicated report for cardiovascular
details.

Mediastinum/Nodes: No signs of adenopathy in the visualized chest.
Esophagus grossly normal. Imaging is limited to the mid chest and
cardiac structures.

Lungs/Pleura: Minimal basilar atelectasis. Airways are patent.
Visualized lungs are clear.

Upper Abdomen: Signs of hepatic steatosis and lobular hepatic
contours. Liver incompletely imaged. No acute upper abdominal
process on very limited assessment.

Musculoskeletal: No acute bone finding. No destructive bone process.
Spinal degenerative changes.
IMPRESSION: Hepatic steatosis, not fully assessed. Correlate with hepatic
function.
FINDINGS: Coronary Calcium Score:

Left main: 0

Left anterior descending artery: 0

Left circumflex artery: 0

Right coronary artery: 0

Total: 0

Percentile: NA

Pericardium: Normal.

Non-cardiac: See separate report from [REDACTED].
IMPRESSION: Coronary calcium score of 0.



If CAC=0, it is reasonable to withhold statin therapy and reassess
in 5 to 10 years, as long as higher risk conditions are absent
(diabetes mellitus, family history of premature CHD in first degree
relatives (males <55 years; females <65 years), cigarette smoking,
or LDL >=190 mg/dL).

If CAC is 1 to 99, it is reasonable to initiate statin therapy for
patients >=55 years of age.

If CAC is >=100 or >=75th percentile, it is reasonable to initiate
statin therapy at any age.

Cardiology referral should be considered for patients with CAC
scores >=400 or >=75th percentile.

*[VV] AHA/ACC/AACVPR/AAPA/ABC/CALE/CALE/CALE/CALE/CALE/CALE/CALE
Guideline on the Management of Blood Cholesterol: A Report of the
American College of Cardiology/American Heart Association Task Force
on Clinical Practice Guidelines. J Am Coll Cardiol.
[VV];73(24):[PHONE_NUMBER].

*** End of Addendum ***
EXAM:
OVER-READ INTERPRETATION  CT CHEST

The following report is an over-read performed by radiologist Dr.
over-read does not include interpretation of cardiac or coronary
anatomy or pathology. The coronary calcium score interpretation by
the cardiologist is attached. Limited imaging of the chest includes
mid chest and cardiac structures only.
FINDINGS: Cardiovascular: Please see dedicated report for cardiovascular
details.

Mediastinum/Nodes: No signs of adenopathy in the visualized chest.
Esophagus grossly normal. Imaging is limited to the mid chest and
cardiac structures.

Lungs/Pleura: Minimal basilar atelectasis. Airways are patent.
Visualized lungs are clear.

Upper Abdomen: Signs of hepatic steatosis and lobular hepatic
contours. Liver incompletely imaged. No acute upper abdominal
process on very limited assessment.

Musculoskeletal: No acute bone finding. No destructive bone process.
Spinal degenerative changes.
IMPRESSION: Hepatic steatosis, not fully assessed. Correlate with hepatic
function.

## 2021-03-21 ENCOUNTER — Other Ambulatory Visit (HOSPITAL_COMMUNITY): Payer: Self-pay

## 2021-03-21 MED ORDER — CARESTART COVID-19 HOME TEST VI KIT
PACK | 0 refills | Status: DC
  Filled 2021-03-21: qty 4, 4d supply, fill #0

## 2021-03-27 ENCOUNTER — Ambulatory Visit (INDEPENDENT_AMBULATORY_CARE_PROVIDER_SITE_OTHER): Payer: 59 | Admitting: Family Medicine

## 2021-04-01 DIAGNOSIS — H9042 Sensorineural hearing loss, unilateral, left ear, with unrestricted hearing on the contralateral side: Secondary | ICD-10-CM | POA: Diagnosis not present

## 2021-04-02 ENCOUNTER — Other Ambulatory Visit: Payer: Self-pay

## 2021-04-02 ENCOUNTER — Ambulatory Visit (INDEPENDENT_AMBULATORY_CARE_PROVIDER_SITE_OTHER): Payer: 59 | Admitting: Family Medicine

## 2021-04-02 ENCOUNTER — Encounter (INDEPENDENT_AMBULATORY_CARE_PROVIDER_SITE_OTHER): Payer: Self-pay | Admitting: Family Medicine

## 2021-04-02 VITALS — BP 105/74 | HR 52 | Temp 97.9°F | Ht 64.0 in | Wt 205.0 lb

## 2021-04-02 DIAGNOSIS — D2262 Melanocytic nevi of left upper limb, including shoulder: Secondary | ICD-10-CM | POA: Diagnosis not present

## 2021-04-02 DIAGNOSIS — L821 Other seborrheic keratosis: Secondary | ICD-10-CM | POA: Diagnosis not present

## 2021-04-02 DIAGNOSIS — Z6835 Body mass index (BMI) 35.0-35.9, adult: Secondary | ICD-10-CM

## 2021-04-02 DIAGNOSIS — G43909 Migraine, unspecified, not intractable, without status migrainosus: Secondary | ICD-10-CM | POA: Diagnosis not present

## 2021-04-02 DIAGNOSIS — D2261 Melanocytic nevi of right upper limb, including shoulder: Secondary | ICD-10-CM | POA: Diagnosis not present

## 2021-04-02 DIAGNOSIS — D2272 Melanocytic nevi of left lower limb, including hip: Secondary | ICD-10-CM | POA: Diagnosis not present

## 2021-04-02 DIAGNOSIS — E1169 Type 2 diabetes mellitus with other specified complication: Secondary | ICD-10-CM | POA: Diagnosis not present

## 2021-04-02 DIAGNOSIS — D225 Melanocytic nevi of trunk: Secondary | ICD-10-CM | POA: Diagnosis not present

## 2021-04-02 DIAGNOSIS — D2271 Melanocytic nevi of right lower limb, including hip: Secondary | ICD-10-CM | POA: Diagnosis not present

## 2021-04-02 DIAGNOSIS — D1801 Hemangioma of skin and subcutaneous tissue: Secondary | ICD-10-CM | POA: Diagnosis not present

## 2021-04-02 NOTE — Progress Notes (Signed)
Chief Complaint:   OBESITY Elizabeth Cummings is here to discuss her progress with her obesity treatment plan along with follow-up of her obesity related diagnoses. Zaliah is on keeping a food journal and adhering to recommended goals of 1200-1300 calories and 90 grams of protein and states she is following her eating plan approximately 50% of the time. Korey states she is doing 0 minutes 0 times per week.  Today's visit was #: 6 Starting weight: 231 lbs Starting date: 11/14/2020 Today's weight: 205 lbs Today's date: 04/02/2021 Total lbs lost to date: 26 lbs Total lbs lost since last in-office visit: 1 lb  Interim History: Lennis has done well over the holidays and is down 1 lb. Her appetite is well controlled. She notes her water intake could be better.   Subjective:   1. Type 2 diabetes mellitus with other specified complication, without long-term current use of insulin ([place]) Her diabetes mellitus is well controlled. Her A1C was 6.6 at primary care physician visit. Her labs not in Epic.  Hudsyn is tolerating 10 mg Mounjaro weekly. Her primary care physician increased her dose to 10 mg December 23rd. She denies constipation or nausea.  Lab Results  Component Value Date   HGBA1C 6.4 (H) 11/14/2020   HGBA1C 6.4 12/27/2014   HGBA1C 6.3 09/06/2014   Lab Results  Component Value Date   LDLCALC 106 (H) 11/14/2020   CREATININE 0.66 11/14/2020   Lab Results  Component Value Date   INSULIN 20.3 11/14/2020    2. Migraine without status migrainosus, not intractable, unspecified migraine type Aryahi notes she has had a few migraines over past month and wonders if Mounjaro could be causing this. However, she also feels she has had more stress than usual. Her water intake is insufficient.   Assessment/Plan:   1. Type 2 diabetes mellitus with other specified complication, without long-term current use of insulin (HCC) Rebacca will continue Mounjaro at 10 mg weekly.   2. Migraine  without status migrainosus, not intractable, unspecified migraine type Migraines are not likely related to [facility]. Terriyah will increase her water intake.   3. Obesity with current BMI of 35.17 Holy is currently in the action stage of change. As such, her goal is to continue with weight loss efforts. She has agreed to keeping a food journal and adhering to recommended goals of 1200-1300 calories and 90 grams of protein daily.  Exercise goals: No exercise has been prescribed at this time.  Behavioral modification strategies: increasing lean protein intake, decreasing simple carbohydrates, increasing water intake, and keeping a strict food journal.  Tamecka has agreed to follow-up with our clinic in 4 weeks.  Objective:   Blood pressure 105/74, pulse (!) 52, temperature 97.9 F (36.6 C), height 5' 4"  (1.626 m), weight 205 lb (93 kg), SpO2 100 %. Body mass index is 35.19 kg/m.  General: Cooperative, alert, well developed, in no acute distress. HEENT: Conjunctivae and lids unremarkable. Cardiovascular: Regular rhythm.  Lungs: Normal work of breathing. Neurologic: No focal deficits.   Lab Results  Component Value Date   CREATININE 0.66 11/14/2020   BUN 11 11/14/2020   NA 141 11/14/2020   K 4.0 11/14/2020   CL 100 11/14/2020   CO2 21 11/14/2020   Lab Results  Component Value Date   ALT 22 11/14/2020   AST 38 11/14/2020   ALKPHOS 111 11/14/2020   BILITOT 0.3 11/14/2020   Lab Results  Component Value Date   HGBA1C 6.4 (H) 11/14/2020   HGBA1C 6.4  12/27/2014   HGBA1C 6.3 09/06/2014   Lab Results  Component Value Date   INSULIN 20.3 11/14/2020   Lab Results  Component Value Date   TSH 0.785 11/14/2020   Lab Results  Component Value Date   CHOL 172 11/14/2020   HDL 42 11/14/2020   LDLCALC 106 (H) 11/14/2020   TRIG 134 11/14/2020   Lab Results  Component Value Date   VD25OH 50.9 11/14/2020   Lab Results  Component Value Date   WBC 12.1 (H) 11/14/2020    HGB 12.0 11/14/2020   HCT 36.6 11/14/2020   MCV 83 11/14/2020   PLT 545 (H) 11/14/2020   Lab Results  Component Value Date   IRON 58 05/08/2014   TIBC 506 (H) 05/08/2014   FERRITIN 36 05/08/2014   Attestation Statements:   Reviewed by clinician on day of visit: allergies, medications, problem list, medical history, surgical history, family history, social history, and previous encounter notes.  I, Lizbeth Bark, RMA, am acting as Location manager for [employer], [place].  I have reviewed the above documentation for accuracy and completeness, and I agree with the above. -  Georgianne Fick, FNP

## 2021-04-03 ENCOUNTER — Other Ambulatory Visit (INDEPENDENT_AMBULATORY_CARE_PROVIDER_SITE_OTHER): Payer: Self-pay

## 2021-04-16 ENCOUNTER — Other Ambulatory Visit (HOSPITAL_COMMUNITY): Payer: Self-pay

## 2021-04-16 MED FILL — Norethindrone-Eth Estradiol Tab 0.5-35/0.75-35/1-35 MG-MCG: ORAL | 84 days supply | Qty: 84 | Fill #3 | Status: AC

## 2021-04-21 ENCOUNTER — Other Ambulatory Visit (HOSPITAL_COMMUNITY): Payer: Self-pay

## 2021-04-23 ENCOUNTER — Telehealth: Payer: 59 | Admitting: Physician Assistant

## 2021-04-23 ENCOUNTER — Other Ambulatory Visit (HOSPITAL_COMMUNITY): Payer: Self-pay

## 2021-04-23 DIAGNOSIS — J208 Acute bronchitis due to other specified organisms: Secondary | ICD-10-CM

## 2021-04-23 MED ORDER — CEFDINIR 300 MG PO CAPS
300.0000 mg | ORAL_CAPSULE | Freq: Two times a day (BID) | ORAL | 0 refills | Status: AC
  Filled 2021-04-23: qty 20, 10d supply, fill #0

## 2021-04-23 MED ORDER — BENZONATATE 100 MG PO CAPS
100.0000 mg | ORAL_CAPSULE | Freq: Three times a day (TID) | ORAL | 0 refills | Status: DC | PRN
  Filled 2021-04-23: qty 30, 10d supply, fill #0

## 2021-04-23 NOTE — Progress Notes (Signed)
We are sorry that you are not feeling well.  Here is how we plan to help!  Based on your presentation I believe you most likely have A cough due to a virus.  This is called viral bronchitis and is best treated by rest, plenty of fluids and control of the cough.  You may use Ibuprofen or Tylenol as directed to help your symptoms.     In addition you may use A prescription cough medication called Tessalon Perles 148m. You may take 1-2 capsules every 8 hours as needed for your cough.   From your responses in the eVisit questionnaire you describe inflammation in the upper respiratory tract which is causing a significant cough.  This is commonly called Bronchitis and has four common causes:   Allergies Viral Infections Acid Reflux Bacterial Infection Allergies, viruses and acid reflux are treated by controlling symptoms or eliminating the cause. An example might be a cough caused by taking certain blood pressure medications. You stop the cough by changing the medication. Another example might be a cough caused by acid reflux. Controlling the reflux helps control the cough.  USE OF BRONCHODILATOR ("RESCUE") INHALERS: There is a risk from using your bronchodilator too frequently.  The risk is that over-reliance on a medication which only relaxes the muscles surrounding the breathing tubes can reduce the effectiveness of medications prescribed to reduce swelling and congestion of the tubes themselves.  Although you feel brief relief from the bronchodilator inhaler, your asthma may actually be worsening with the tubes becoming more swollen and filled with mucus.  This can delay other crucial treatments, such as oral steroid medications. If you need to use a bronchodilator inhaler daily, several times per day, you should discuss this with your provider.  There are probably better treatments that could be used to keep your asthma under control.     HOME CARE Only take medications as instructed by your  medical team. Complete the entire course of an antibiotic. Drink plenty of fluids and get plenty of rest. Avoid close contacts especially the very young and the elderly Cover your mouth if you cough or cough into your sleeve. Always remember to wash your hands A steam or ultrasonic humidifier can help congestion.   GET HELP RIGHT AWAY IF: You develop worsening fever. You become short of breath You cough up blood. Your symptoms persist after you have completed your treatment plan MAKE SURE YOU  Understand these instructions. Will watch your condition. Will get help right away if you are not doing well or get worse.    Thank you for choosing an e-visit.  Your e-visit answers were reviewed by a board certified advanced clinical practitioner to complete your personal care plan. Depending upon the condition, your plan could have included both over the counter or prescription medications.  Please review your pharmacy choice. Make sure the pharmacy is open so you can pick up prescription now. If there is a problem, you may contact your provider through [employer] have the prescription routed to another pharmacy.  Your safety is important to [place] If you have drug allergies check your prescription carefully.   For the next 24 hours you can use MyChart to ask questions about today's visit, request a non-urgent call back, or ask for a work or school excuse. You will get an email in the next two days asking about your experience. I hope that your e-visit has been valuable and will speed your recovery.

## 2021-04-23 NOTE — Progress Notes (Signed)
I have spent 5 minutes in review of e-visit questionnaire, review and updating patient chart, medical decision making and response to patient.   Nedim Oki Cody Glynnis Gavel, PA-C    

## 2021-04-30 ENCOUNTER — Other Ambulatory Visit: Payer: Self-pay

## 2021-04-30 ENCOUNTER — Other Ambulatory Visit (HOSPITAL_COMMUNITY): Payer: Self-pay

## 2021-04-30 ENCOUNTER — Ambulatory Visit (INDEPENDENT_AMBULATORY_CARE_PROVIDER_SITE_OTHER): Payer: 59 | Admitting: Family Medicine

## 2021-04-30 ENCOUNTER — Encounter (INDEPENDENT_AMBULATORY_CARE_PROVIDER_SITE_OTHER): Payer: Self-pay | Admitting: Family Medicine

## 2021-04-30 VITALS — BP 132/84 | HR 84 | Temp 98.1°F | Ht 64.0 in | Wt 203.0 lb

## 2021-04-30 DIAGNOSIS — Z7985 Long-term (current) use of injectable non-insulin antidiabetic drugs: Secondary | ICD-10-CM | POA: Diagnosis not present

## 2021-04-30 DIAGNOSIS — Z6836 Body mass index (BMI) 36.0-36.9, adult: Secondary | ICD-10-CM

## 2021-04-30 DIAGNOSIS — E669 Obesity, unspecified: Secondary | ICD-10-CM

## 2021-04-30 DIAGNOSIS — Z6834 Body mass index (BMI) 34.0-34.9, adult: Secondary | ICD-10-CM | POA: Diagnosis not present

## 2021-04-30 DIAGNOSIS — E1169 Type 2 diabetes mellitus with other specified complication: Secondary | ICD-10-CM

## 2021-04-30 DIAGNOSIS — G43909 Migraine, unspecified, not intractable, without status migrainosus: Secondary | ICD-10-CM

## 2021-04-30 MED ORDER — MOUNJARO 10 MG/0.5ML ~~LOC~~ SOAJ
10.0000 mg | SUBCUTANEOUS | 6 refills | Status: DC
  Filled 2021-04-30: qty 2, 28d supply, fill #0

## 2021-05-01 NOTE — Progress Notes (Signed)
Chief Complaint:   OBESITY Elizabeth Cummings is here to discuss her progress with her obesity treatment plan along with follow-up of her obesity related diagnoses. [name] is on keeping a food journal and adhering to recommended goals of 1200-1300 calories and 90 grams of protein and states she is following her eating plan approximately 50% of the time. [name] states she is walking, counting steps 7,000-10,000 daily 5 times per week.  Today's visit was #: 7 Starting weight: 231 lbs Starting date: 11/14/2020 Today's weight: 203 lbs Today's date:04/30/2021 Total lbs lost to date: 28 lbs Total lbs lost since last in-office visit: 2 lbs  Interim History: [name] is not journaling. She eats 3 meals per day. She does not always have protein at meals. She notes she does not drink enough water. She and her husband tend to eat out 4-5 evenings per week since their son is grown and it is just her she and her husband in the home.  Subjective:   1. Type 2 diabetes mellitus with other specified complication, without long-term current use of insulin (HCC) [name] notes shakiness when CBGs are in 80's or 90's. Her Type 2 diabetes mellitus is well controlled.  She is currently on Mounjaro 10 mg.  Lab Results  Component Value Date   HGBA1C 6.6 03/14/2021   HGBA1C 6.4 (H) 11/14/2020   HGBA1C 6.4 12/27/2014   Lab Results  Component Value Date   LDLCALC 86 03/14/2021   CREATININE 0.7 03/14/2021   Lab Results  Component Value Date   INSULIN 20.3 11/14/2020    2. Migraine without status migrainosus, not intractable, unspecified migraine type [name] started Nurtec for migraine relief and has noted good relief. She has about 1 migraine per month.  Assessment/Plan:   1. Type 2 diabetes mellitus with other specified complication, without long-term current use of insulin (HCC) We will refill Mounjaro 10 mg weekly.  - tirzepatide (MOUNJARO) 10 MG/0.5ML Pen; Inject 10 mg into the skin once a week.   Dispense: 2 mL; Refill: 6  2. Migraine without status migrainosus, not intractable, unspecified migraine type [name] will continue Nurtec as directed. She see primary care physician for management.  3. Obesity with current BMI of 34.83 [name] is currently in the action stage of change. As such, her goal is to continue with weight loss efforts. She has agreed to keeping a food journal and adhering to recommended goals of 1200-1300 calories and 90 grams of protein daily.   Exercise goals:  As is.  Behavioral modification strategies: increasing lean protein intake, decreasing simple carbohydrates, and decreasing eating out.  [name] has agreed to follow-up with our clinic in 4-5 weeks (virtual).  Objective:   Blood pressure 132/84, pulse 84, temperature 98.1 F (36.7 C), height 5' 4"  (1.626 m), weight 203 lb (92.1 kg), SpO2 96 %. Body mass index is 34.84 kg/m.  General: Cooperative, alert, well developed, in no acute distress. HEENT: Conjunctivae and lids unremarkable. Cardiovascular: Regular rhythm.  Lungs: Normal work of breathing. Neurologic: No focal deficits.   Lab Results  Component Value Date   CREATININE 0.7 03/14/2021   BUN 10 03/14/2021   NA 139 03/14/2021   K 3.6 03/14/2021   CL 99 03/14/2021   CO2 28 (A) 03/14/2021   Lab Results  Component Value Date   ALT 17 03/14/2021   AST 17 03/14/2021   ALKPHOS 97 03/14/2021   BILITOT 0.3 11/14/2020   Lab Results  Component Value Date   HGBA1C 6.6 03/14/2021  HGBA1C 6.4 (H) 11/14/2020   HGBA1C 6.4 12/27/2014   HGBA1C 6.3 09/06/2014   Lab Results  Component Value Date   INSULIN 20.3 11/14/2020   Lab Results  Component Value Date   TSH 0.785 11/14/2020   Lab Results  Component Value Date   CHOL 156 03/14/2021   HDL 49 03/14/2021   LDLCALC 86 03/14/2021   TRIG 121 03/14/2021   Lab Results  Component Value Date   VD25OH 50.9 11/14/2020   Lab Results  Component Value Date   WBC 13.2 03/14/2021    HGB 12.7 03/14/2021   HCT 38 03/14/2021   MCV 83 11/14/2020   PLT 419 (A) 03/14/2021   Lab Results  Component Value Date   IRON 58 05/08/2014   TIBC 506 (H) 05/08/2014   FERRITIN 36 05/08/2014   Attestation Statements:   Reviewed by clinician on day of visit: allergies, medications, problem list, medical history, surgical history, family history, social history, and previous encounter notes.  I, Lizbeth Bark, RMA, am acting as Location manager for [employer], [place].  I have reviewed the above documentation for accuracy and completeness, and I agree with the above. -  Georgianne Fick, FNP

## 2021-05-05 DIAGNOSIS — H5213 Myopia, bilateral: Secondary | ICD-10-CM | POA: Diagnosis not present

## 2021-05-05 DIAGNOSIS — E119 Type 2 diabetes mellitus without complications: Secondary | ICD-10-CM | POA: Diagnosis not present

## 2021-05-05 DIAGNOSIS — H524 Presbyopia: Secondary | ICD-10-CM | POA: Diagnosis not present

## 2021-05-05 DIAGNOSIS — Q141 Congenital malformation of retina: Secondary | ICD-10-CM | POA: Diagnosis not present

## 2021-05-14 DIAGNOSIS — Z1389 Encounter for screening for other disorder: Secondary | ICD-10-CM | POA: Diagnosis not present

## 2021-05-14 DIAGNOSIS — Z130 Encounter for screening for diseases of the blood and blood-forming organs and certain disorders involving the immune mechanism: Secondary | ICD-10-CM | POA: Diagnosis not present

## 2021-05-14 DIAGNOSIS — Z1151 Encounter for screening for human papillomavirus (HPV): Secondary | ICD-10-CM | POA: Diagnosis not present

## 2021-05-14 DIAGNOSIS — N920 Excessive and frequent menstruation with regular cycle: Secondary | ICD-10-CM | POA: Diagnosis not present

## 2021-05-14 DIAGNOSIS — Z124 Encounter for screening for malignant neoplasm of cervix: Secondary | ICD-10-CM | POA: Diagnosis not present

## 2021-05-14 DIAGNOSIS — Z01411 Encounter for gynecological examination (general) (routine) with abnormal findings: Secondary | ICD-10-CM | POA: Diagnosis not present

## 2021-05-14 DIAGNOSIS — N939 Abnormal uterine and vaginal bleeding, unspecified: Secondary | ICD-10-CM | POA: Diagnosis not present

## 2021-05-14 DIAGNOSIS — Z1231 Encounter for screening mammogram for malignant neoplasm of breast: Secondary | ICD-10-CM | POA: Diagnosis not present

## 2021-05-14 DIAGNOSIS — Z01419 Encounter for gynecological examination (general) (routine) without abnormal findings: Secondary | ICD-10-CM | POA: Diagnosis not present

## 2021-05-15 ENCOUNTER — Other Ambulatory Visit (HOSPITAL_COMMUNITY): Payer: Self-pay

## 2021-05-15 MED ORDER — DASETTA 7/7/7 0.5/0.75/1-35 MG-MCG PO TABS
1.0000 | ORAL_TABLET | Freq: Every day | ORAL | 4 refills | Status: DC
  Filled 2021-05-15: qty 84, 84d supply, fill #0

## 2021-05-22 ENCOUNTER — Other Ambulatory Visit: Payer: Self-pay | Admitting: Obstetrics and Gynecology

## 2021-05-22 DIAGNOSIS — N6489 Other specified disorders of breast: Secondary | ICD-10-CM

## 2021-05-30 ENCOUNTER — Other Ambulatory Visit (HOSPITAL_COMMUNITY): Payer: Self-pay

## 2021-05-30 ENCOUNTER — Telehealth: Payer: 59 | Admitting: Family

## 2021-05-30 DIAGNOSIS — J019 Acute sinusitis, unspecified: Secondary | ICD-10-CM | POA: Diagnosis not present

## 2021-05-30 MED ORDER — AMOXICILLIN-POT CLAVULANATE 875-125 MG PO TABS
1.0000 | ORAL_TABLET | Freq: Two times a day (BID) | ORAL | 0 refills | Status: DC
  Filled 2021-05-30: qty 14, 7d supply, fill #0

## 2021-05-30 NOTE — Progress Notes (Signed)

## 2021-06-03 NOTE — Progress Notes (Addendum)
?TeleHealth Visit:  ?Due to the COVID-19 pandemic, this visit was completed with telemedicine (audio/video) technology to reduce patient and provider exposure as well as to preserve personal protective equipment.  ? ?Elizabeth Cummings has verbally consented to this TeleHealth visit. The patient is located at home, the provider is located at home. The participants in this visit include the listed provider and patient. The visit was conducted today via MyChart video. ? ?OBESITY ?Elizabeth Cummings is here to discuss her progress with her obesity treatment plan along with follow-up of her obesity related diagnoses.  ? ?Today's date: 06/03/2021 ?Today's visit was #8 ?Starting weight: 231 lbs ?Starting date: 11/14/20 ?Total weight loss: 29 lbs ?Weight at last in person visit: 203 lbs ?Today's reported weight: 202.6 lbs  ?Weight change since last visit: -1 ? ?Interim History: Elizabeth Cummings feels her weight has plateaued.  She does tend to eat out most evenings for dinner since it is only her and her husband at home.  She reports trying to make healthy choices when she eats out.  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  If they do not bring in food she goes to the cafeteria.  For breakfast she usually has oatmeal or protein shake. ? ?Nutrition Plan: keeping a food journal and adhering to recommended goals of 1200-1300 calories and 90 gms protein.  ?Hunger is well controlled. Cravings are moderately controlled.  ?Current exercise: none ?Assessment/Plan:  ? ?Type II Diabetes ?HgbA1c is at goal. ?Current monitoring regimen: none ?Any episodes of hypoglycemia? no ?Medication(s): Mounjaro 10 mg ? ?Lab Results  ?Component Value Date  ? HGBA1C 6.6 03/14/2021  ? HGBA1C 6.4 (H) 11/14/2020  ? HGBA1C 6.4 12/27/2014  ? ?Lab Results  ?Component Value Date  ? [place] 86 03/14/2021  ? CREATININE 0.7 03/14/2021  ? ? ?Plan: ?I sent in the 12.5 mg dose of Mounjaro during our visit  and they called her to let her know it was not available.  We will continue 10 mg dose of Mounjaro for now. ?Check A1c.  Orders placed and patient will go to the office to have labs drawn sometime after March 23. ? ?Vitamin D Deficiency ?Vitamin D is at goal of 50. She is on Os-Cal with D. ?Lab Results  ?Component Value Date  ? VD25OH 50.9 11/14/2020  ? ? ?Plan: Check Vit D. ?Continue Os-cal ? ?Obesity: Current BMI 34.65 ?Elizabeth Cummings is currently in the action stage of change. As such, her goal is to continue with weight loss efforts. She has agreed to keeping a food journal and adhering to recommended goals of 1200-1300 calories and 90 gms protein.  ? ?Exercise goals: All adults should avoid inactivity. Some physical activity is better than none, and adults who participate in any amount of physical activity gain some health benefits. ?Continue exercise:  she will start  ? ?Behavioral modification strategies: decreasing eating out and meal planning and cooking strategies. ?She will try the [place] or Hosie Poisson protein oatmeal and make it with milk to increase the protein. ?Encouraged her to pack her lunch for work and try to eat the vendor food only 1 day/week at lunch. ? ?Elizabeth Cummings has agreed to follow-up with our clinic in 4 weeks.  ? ?No orders of the defined types were placed in this encounter. ? ? ?There are no discontinued medications.  ? ?No orders of the defined types were placed in this encounter. ?   ? ?  Objective:  ? ?VITALS: Per patient if applicable, see vitals. ?GENERAL: Alert and in no acute distress. ?CARDIOPULMONARY: No increased WOB. Speaking in clear sentences.  ?PSYCH: Pleasant and cooperative. Speech normal rate and rhythm. Affect is appropriate. Insight and judgement are appropriate. Attention is focused, linear, and appropriate.  ?NEURO: Oriented as arrived to appointment on time with no prompting.  ? ?Lab Results  ?Component Value Date  ? CREATININE 0.7 03/14/2021  ? BUN 10 03/14/2021  ? NA 139  03/14/2021  ? K 3.6 03/14/2021  ? CL 99 03/14/2021  ? CO2 28 (A) 03/14/2021  ? ?Lab Results  ?Component Value Date  ? ALT 17 03/14/2021  ? AST 17 03/14/2021  ? ALKPHOS 97 03/14/2021  ? BILITOT 0.3 11/14/2020  ? ?Lab Results  ?Component Value Date  ? HGBA1C 6.6 03/14/2021  ? HGBA1C 6.4 (H) 11/14/2020  ? HGBA1C 6.4 12/27/2014  ? HGBA1C 6.3 09/06/2014  ? ?Lab Results  ?Component Value Date  ? INSULIN 20.3 11/14/2020  ? ?Lab Results  ?Component Value Date  ? TSH 0.785 11/14/2020  ? ?Lab Results  ?Component Value Date  ? CHOL 156 03/14/2021  ? HDL 49 03/14/2021  ? [place] 86 03/14/2021  ? TRIG 121 03/14/2021  ? ?Lab Results  ?Component Value Date  ? WBC 13.2 03/14/2021  ? HGB 12.7 03/14/2021  ? HCT 38 03/14/2021  ? MCV 83 11/14/2020  ? PLT 419 (A) 03/14/2021  ? ?Lab Results  ?Component Value Date  ? IRON 58 05/08/2014  ? TIBC 506 (H) 05/08/2014  ? FERRITIN 36 05/08/2014  ? ?Lab Results  ?Component Value Date  ? VD25OH 50.9 11/14/2020  ? ? ?Attestation Statements:  ? ?Reviewed by clinician on day of visit: allergies, medications, problem list, medical history, surgical history, family history, social history, and previous encounter notes. ? ? ? ?

## 2021-06-04 ENCOUNTER — Encounter (INDEPENDENT_AMBULATORY_CARE_PROVIDER_SITE_OTHER): Payer: Self-pay | Admitting: Family Medicine

## 2021-06-04 ENCOUNTER — Telehealth (INDEPENDENT_AMBULATORY_CARE_PROVIDER_SITE_OTHER): Payer: 59 | Admitting: Family Medicine

## 2021-06-04 ENCOUNTER — Other Ambulatory Visit (HOSPITAL_COMMUNITY): Payer: Self-pay

## 2021-06-04 DIAGNOSIS — E559 Vitamin D deficiency, unspecified: Secondary | ICD-10-CM | POA: Diagnosis not present

## 2021-06-04 DIAGNOSIS — Z6834 Body mass index (BMI) 34.0-34.9, adult: Secondary | ICD-10-CM | POA: Diagnosis not present

## 2021-06-04 DIAGNOSIS — E1169 Type 2 diabetes mellitus with other specified complication: Secondary | ICD-10-CM

## 2021-06-04 DIAGNOSIS — E669 Obesity, unspecified: Secondary | ICD-10-CM

## 2021-06-04 MED ORDER — TIRZEPATIDE 10 MG/0.5ML ~~LOC~~ SOAJ
10.0000 mg | SUBCUTANEOUS | 0 refills | Status: DC
  Filled 2021-06-04: qty 2, 28d supply, fill #0

## 2021-06-04 MED ORDER — TIRZEPATIDE 12.5 MG/0.5ML ~~LOC~~ SOAJ
12.5000 mg | SUBCUTANEOUS | 0 refills | Status: DC
  Filled 2021-06-04 – 2021-07-04 (×5): qty 2, 28d supply, fill #0

## 2021-06-10 ENCOUNTER — Other Ambulatory Visit: Payer: Self-pay

## 2021-06-10 ENCOUNTER — Ambulatory Visit
Admission: RE | Admit: 2021-06-10 | Discharge: 2021-06-10 | Disposition: A | Discharge status: Home / Self Care | Payer: 59 | Source: Ambulatory Visit | Attending: Obstetrics and Gynecology | Admitting: Obstetrics and Gynecology

## 2021-06-10 ENCOUNTER — Other Ambulatory Visit: Payer: Self-pay | Admitting: Obstetrics and Gynecology

## 2021-06-10 DIAGNOSIS — N6489 Other specified disorders of breast: Secondary | ICD-10-CM

## 2021-06-10 DIAGNOSIS — R928 Other abnormal and inconclusive findings on diagnostic imaging of breast: Secondary | ICD-10-CM | POA: Diagnosis not present

## 2021-06-10 IMAGING — MG MM DIGITAL DIAGNOSTIC UNILAT*L* W/ TOMO W/ CAD
6 series · 6 of 18 positions shown · non-contrast
Comparison: Previous exam(s).

CLINICAL DATA: 45-year-old female presenting as a recall from
screening for possible left breast distortion.

EXAM:
DIGITAL DIAGNOSTIC UNILATERAL LEFT MAMMOGRAM WITH TOMOSYNTHESIS AND
CAD; ULTRASOUND LEFT BREAST LIMITED
TECHNIQUE: Left digital diagnostic mammography and breast tomosynthesis was
performed. The images were evaluated with computer-aided detection.;
Targeted ultrasound examination of the left breast was performed.

[L MLO synth-2D (1 of 2)]
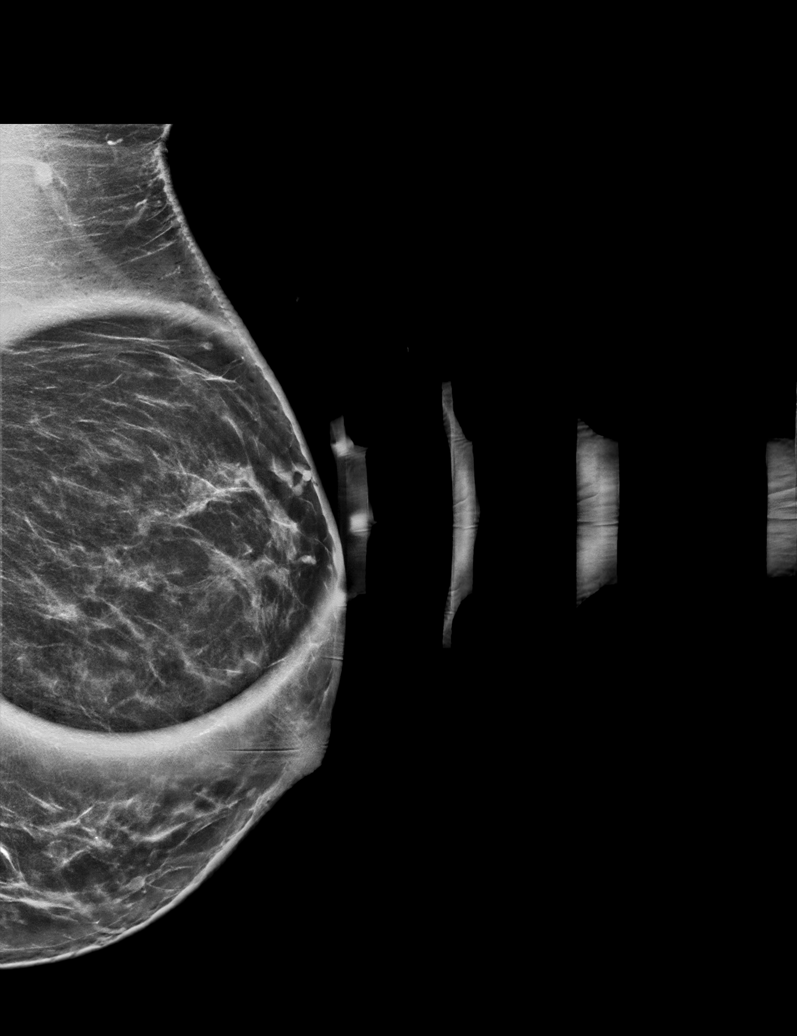

[L CC synth-2D]
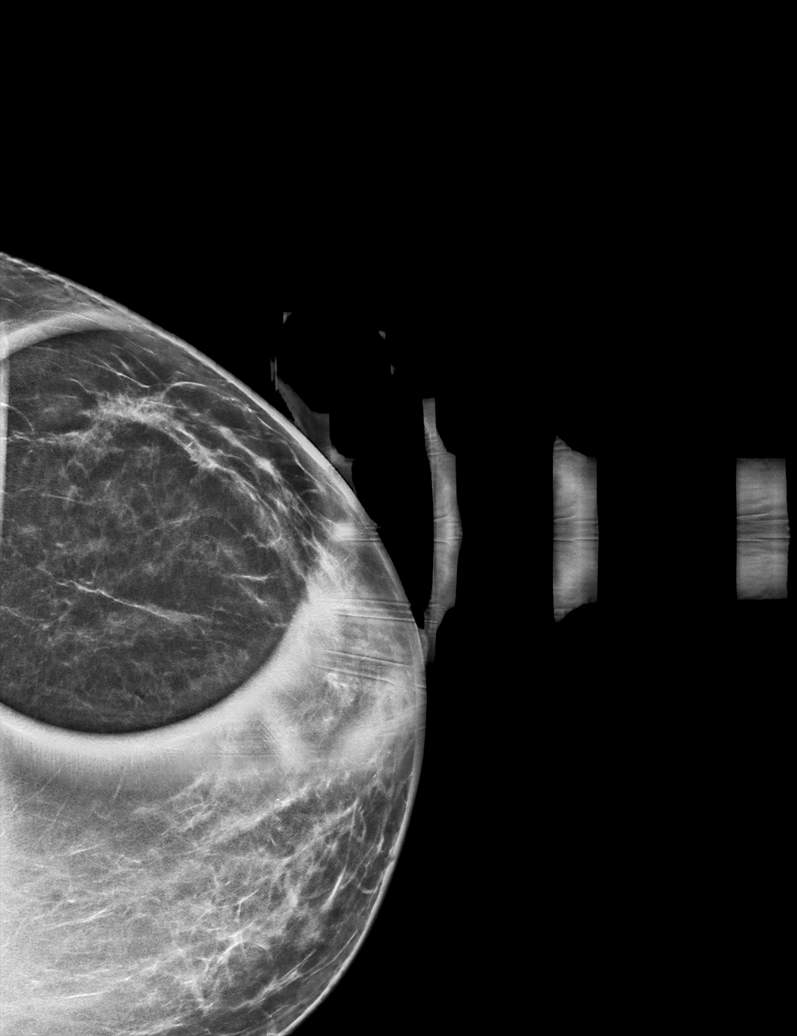

[L MLO synth-2D (2 of 2)]
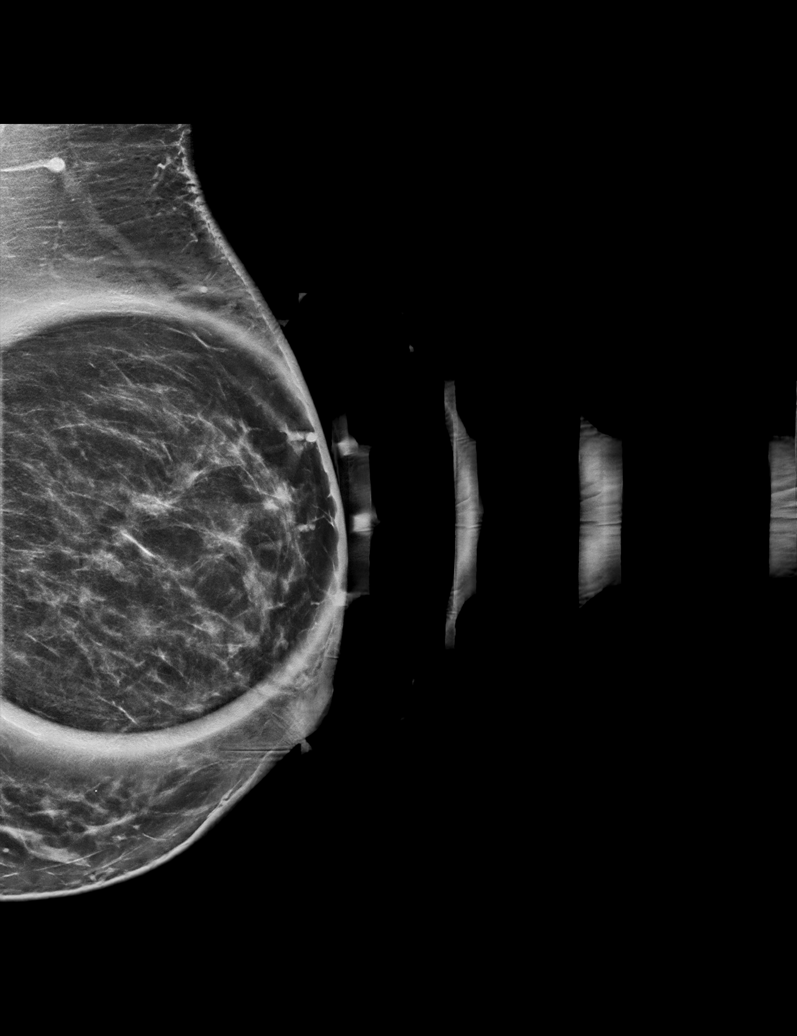

[L MLO tomo (1 of 2) · tomo slice 37/72.0]
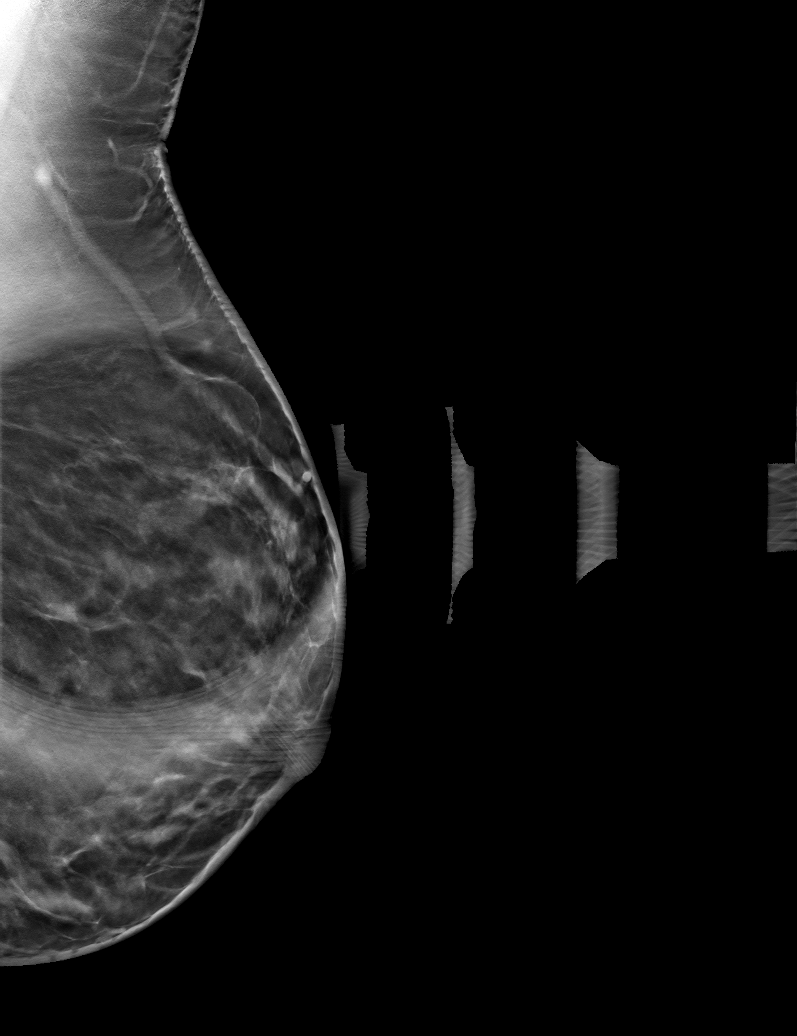

[L CC tomo · tomo slice 29/56.0]
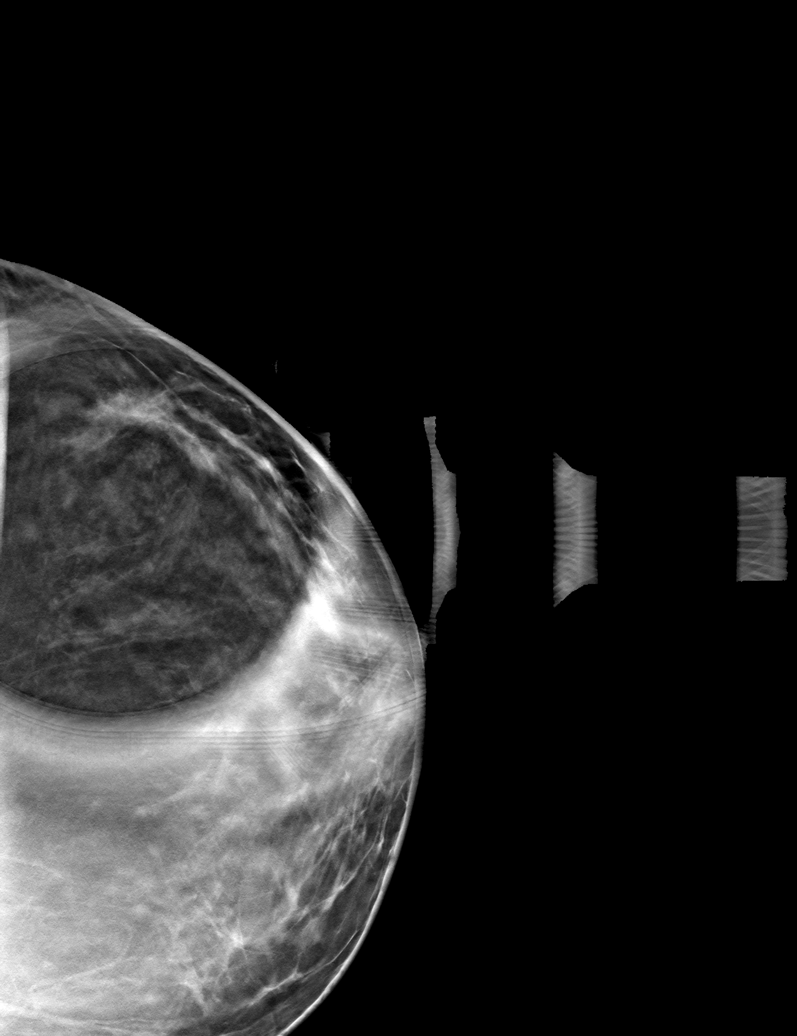

[L MLO tomo (2 of 2) · tomo slice 38/75.0]
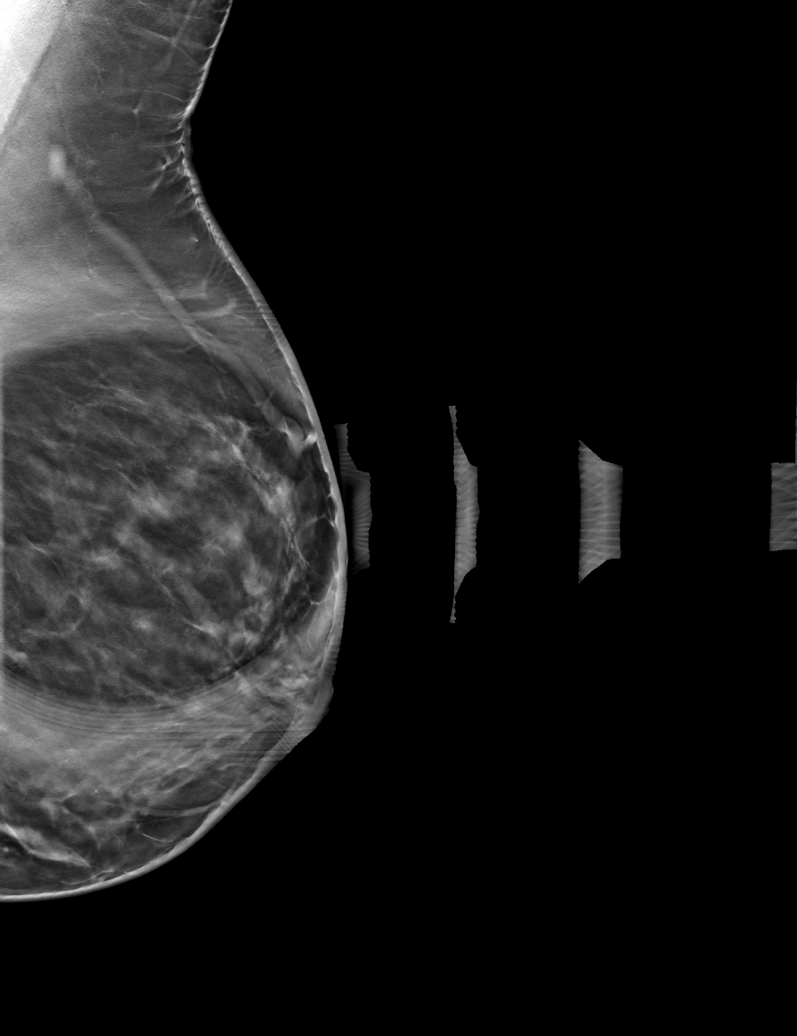

[6 of 18 positions shown; findings below may reference images not displayed]

ACR Breast Density Category b: There are scattered areas of
fibroglandular density.
FINDINGS: Mammogram:

Left breast: Spot compression tomosynthesis cc and MLO views of the
left breast were performed demonstrating persistence of an asymmetry
with subtle distortion in the outer left breast, best seen on the
spot cc view.

Ultrasound:

Targeted ultrasound performed throughout the lateral aspect of the
left breast demonstrating no discrete cystic or solid mass to
correspond to the distortion identified mammographically. Targeted
ultrasound of the left axilla demonstrates normal lymph nodes.
IMPRESSION: Indeterminate asymmetry with subtle distortion in the outer left
breast.

RECOMMENDATION:
Stereotactic core needle biopsy of the left breast.

I have discussed the findings and recommendations with the patient
who agrees to proceed with biopsy. The patient will be scheduled for
the biopsy appointment prior to leaving the office today.

BI-RADS CATEGORY  4: Suspicious.

## 2021-06-10 IMAGING — US US BREAST*L* LIMITED INC AXILLA
1 series · 10 of 10 positions shown · non-contrast
Comparison: Previous exam(s).

CLINICAL DATA: 45-year-old female presenting as a recall from
screening for possible left breast distortion.

EXAM:
DIGITAL DIAGNOSTIC UNILATERAL LEFT MAMMOGRAM WITH TOMOSYNTHESIS AND
CAD; ULTRASOUND LEFT BREAST LIMITED
TECHNIQUE: Left digital diagnostic mammography and breast tomosynthesis was
performed. The images were evaluated with computer-aided detection.;
Targeted ultrasound examination of the left breast was performed.

[Series 1: us breast*left* limited inc axilla · 0.07mm/px · 10 of 10 slices shown]
[im 1/10]
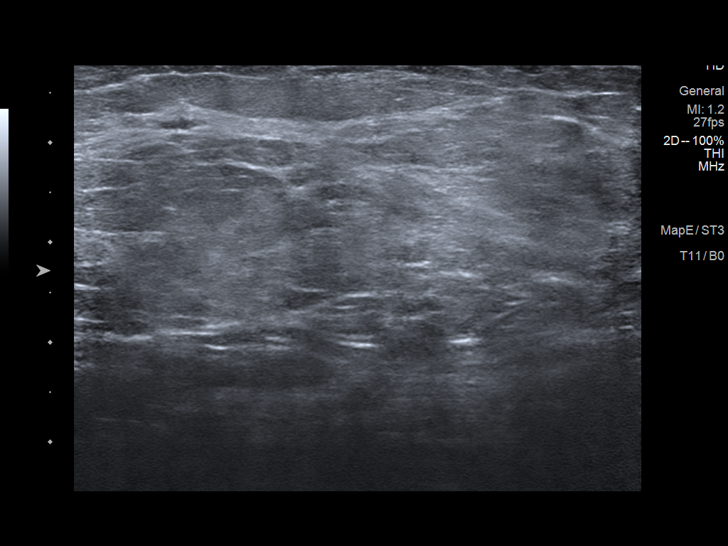
[im 2/10]
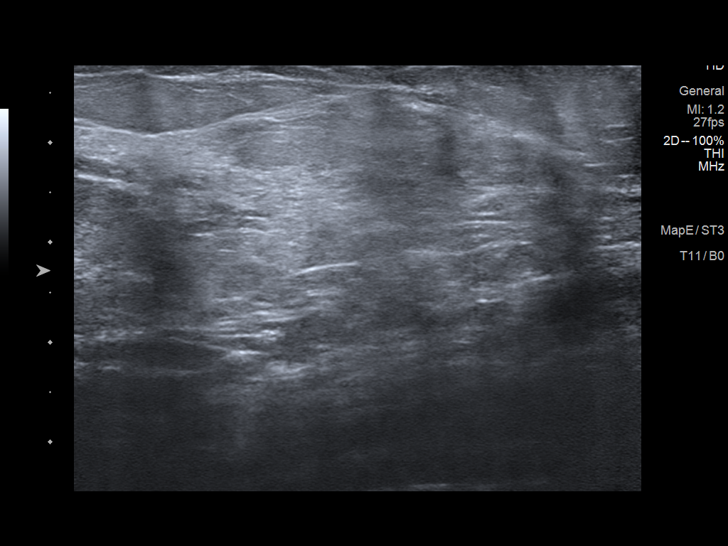
[im 3/10]
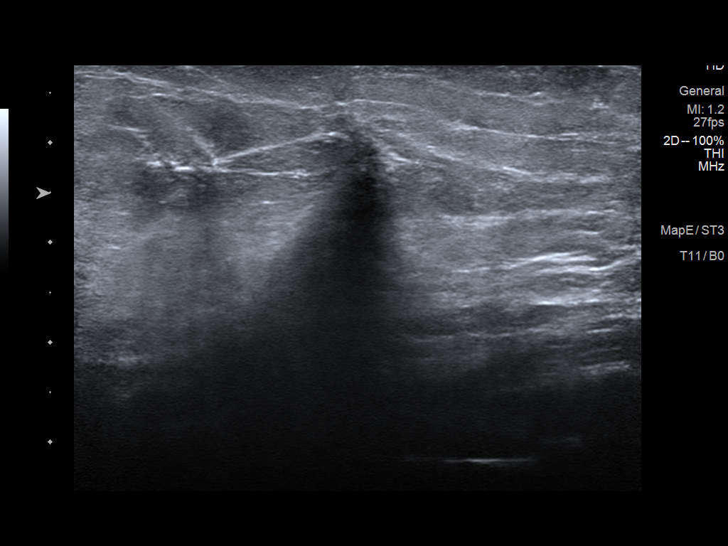
[im 4/10]
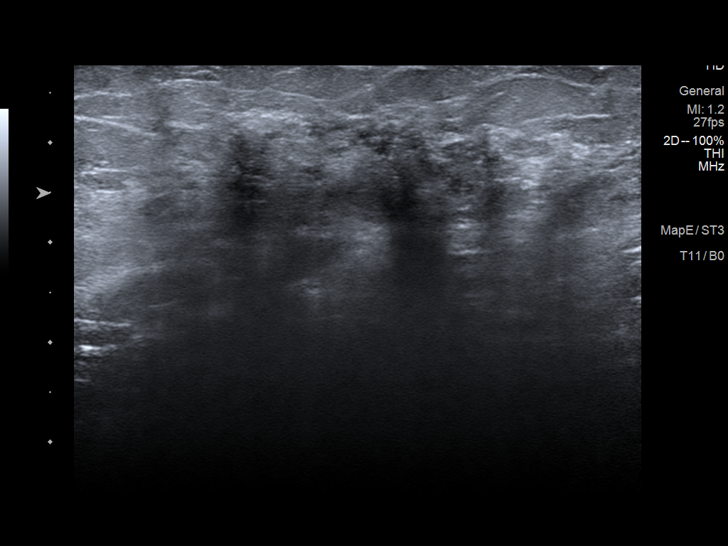
[im 5/10]
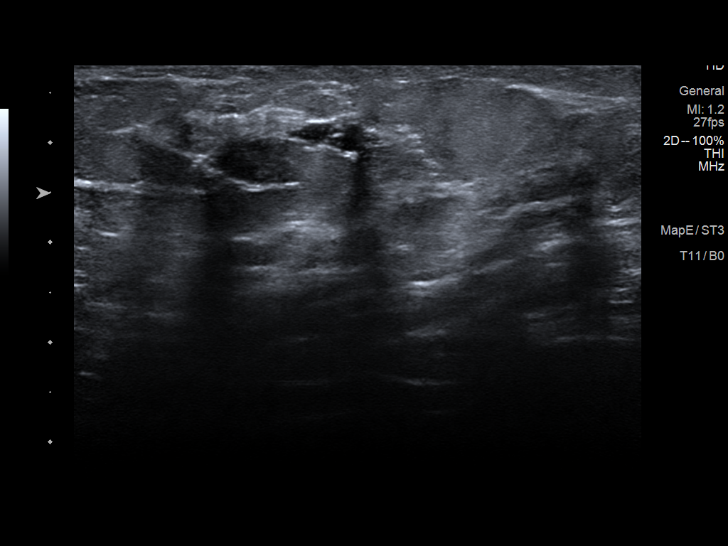
[im 6/10]
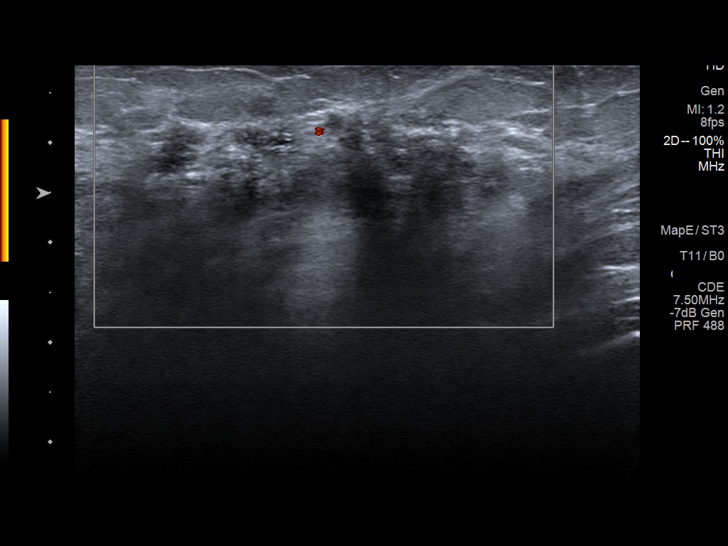
[im 7/10]
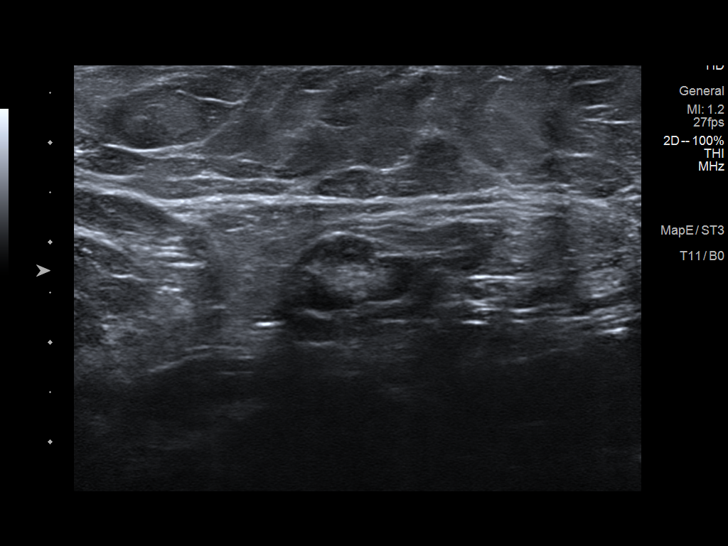
[im 8/10]
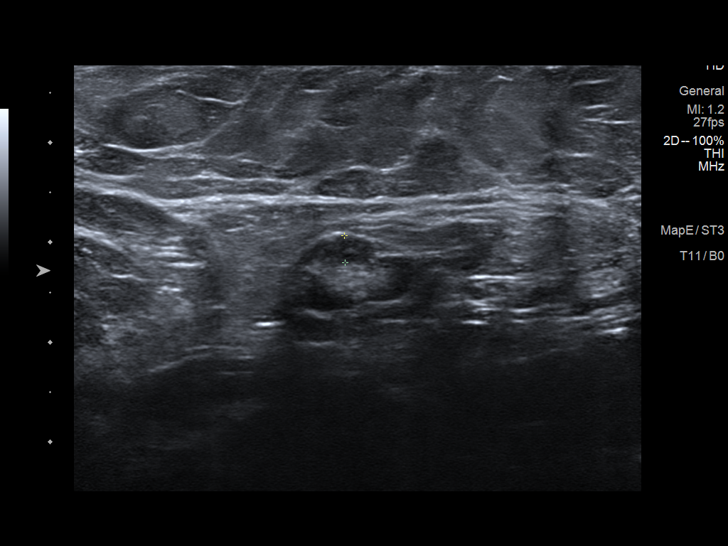
[im 9/10]
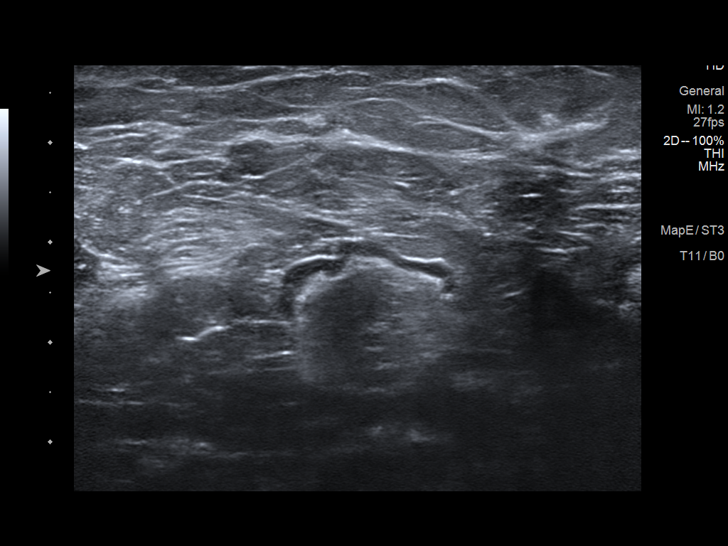
[im 10/10]
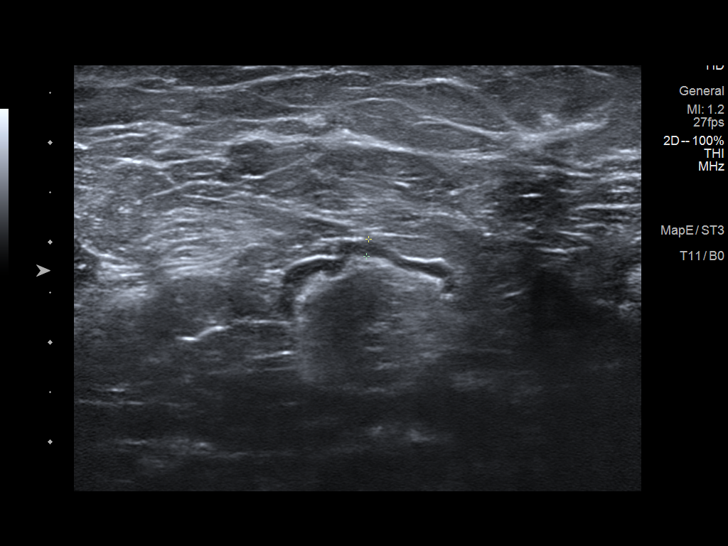

[10 of 10 positions shown; findings below may reference images not displayed]

ACR Breast Density Category b: There are scattered areas of
fibroglandular density.
FINDINGS: Mammogram:

Left breast: Spot compression tomosynthesis cc and MLO views of the
left breast were performed demonstrating persistence of an asymmetry
with subtle distortion in the outer left breast, best seen on the
spot cc view.

Ultrasound:

Targeted ultrasound performed throughout the lateral aspect of the
left breast demonstrating no discrete cystic or solid mass to
correspond to the distortion identified mammographically. Targeted
ultrasound of the left axilla demonstrates normal lymph nodes.
IMPRESSION: Indeterminate asymmetry with subtle distortion in the outer left
breast.

RECOMMENDATION:
Stereotactic core needle biopsy of the left breast.

I have discussed the findings and recommendations with the patient
who agrees to proceed with biopsy. The patient will be scheduled for
the biopsy appointment prior to leaving the office today.

BI-RADS CATEGORY  4: Suspicious.

## 2021-06-12 DIAGNOSIS — E559 Vitamin D deficiency, unspecified: Secondary | ICD-10-CM | POA: Diagnosis not present

## 2021-06-12 DIAGNOSIS — E1169 Type 2 diabetes mellitus with other specified complication: Secondary | ICD-10-CM | POA: Diagnosis not present

## 2021-06-13 LAB — VITAMIN D 25 HYDROXY (VIT D DEFICIENCY, FRACTURES): Vit D, 25-Hydroxy: 50.3 ng/mL (ref 30.0–100.0)

## 2021-06-13 LAB — HEMOGLOBIN A1C
Est. average glucose Bld gHb Est-mCnc: 134 mg/dL
Hgb A1c MFr Bld: 6.3 % — ABNORMAL HIGH (ref 4.8–5.6)

## 2021-06-26 DIAGNOSIS — Z30430 Encounter for insertion of intrauterine contraceptive device: Secondary | ICD-10-CM | POA: Diagnosis not present

## 2021-06-27 ENCOUNTER — Ambulatory Visit
Admission: RE | Admit: 2021-06-27 | Discharge: 2021-06-27 | Disposition: A | Discharge status: Home / Self Care | Payer: 59 | Source: Ambulatory Visit | Attending: Obstetrics and Gynecology | Admitting: Obstetrics and Gynecology

## 2021-06-27 DIAGNOSIS — R928 Other abnormal and inconclusive findings on diagnostic imaging of breast: Secondary | ICD-10-CM | POA: Diagnosis not present

## 2021-06-27 DIAGNOSIS — C50812 Malignant neoplasm of overlapping sites of left female breast: Secondary | ICD-10-CM | POA: Diagnosis not present

## 2021-06-27 DIAGNOSIS — N6489 Other specified disorders of breast: Secondary | ICD-10-CM

## 2021-06-27 DIAGNOSIS — Z170 Estrogen receptor positive status [ER+]: Secondary | ICD-10-CM | POA: Diagnosis not present

## 2021-06-27 HISTORY — PX: BREAST BIOPSY: SHX20

## 2021-06-27 IMAGING — MG MM BREAST BX W LOC DEV 1ST LESION IMAGE BX SPEC STEREO GUIDE*L*
8 of 10 series · 8 of 22 positions shown · non-contrast
Comparison: Previous exams.
COMPARISON: Previous exams.

Addendum:
CLINICAL DATA: Stereotactic biopsy of left breast distortion

EXAM:
LEFT BREAST STEREOTACTIC CORE NEEDLE BIOPSY

[L (1 of 6)]
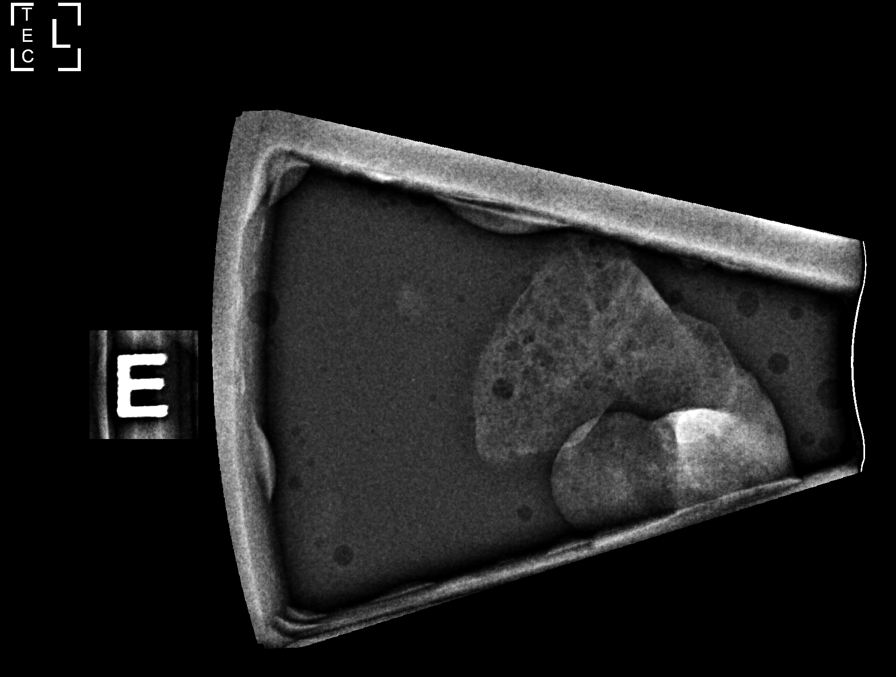

[L (2 of 6)]
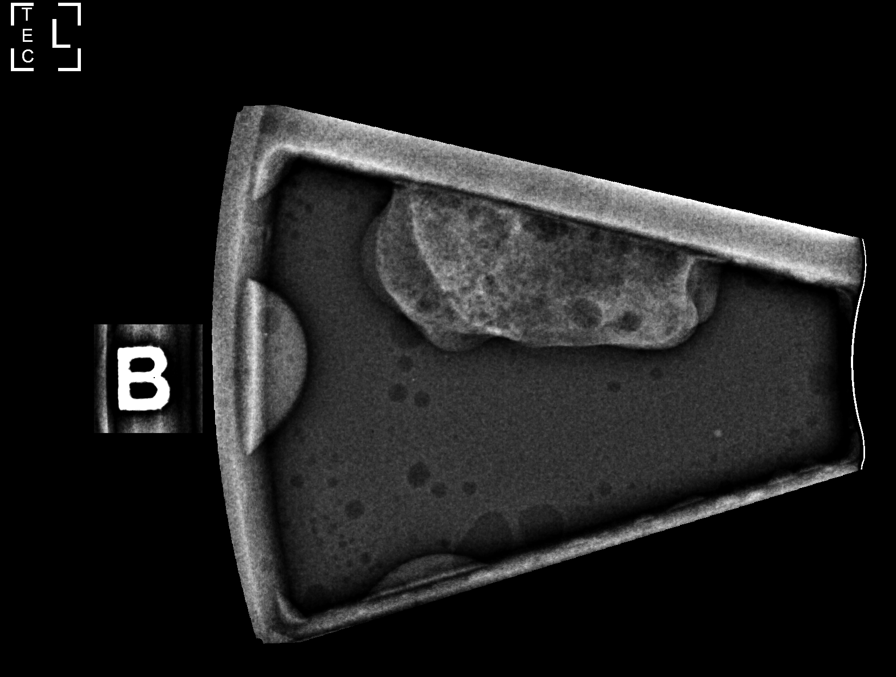

[L (3 of 6)]
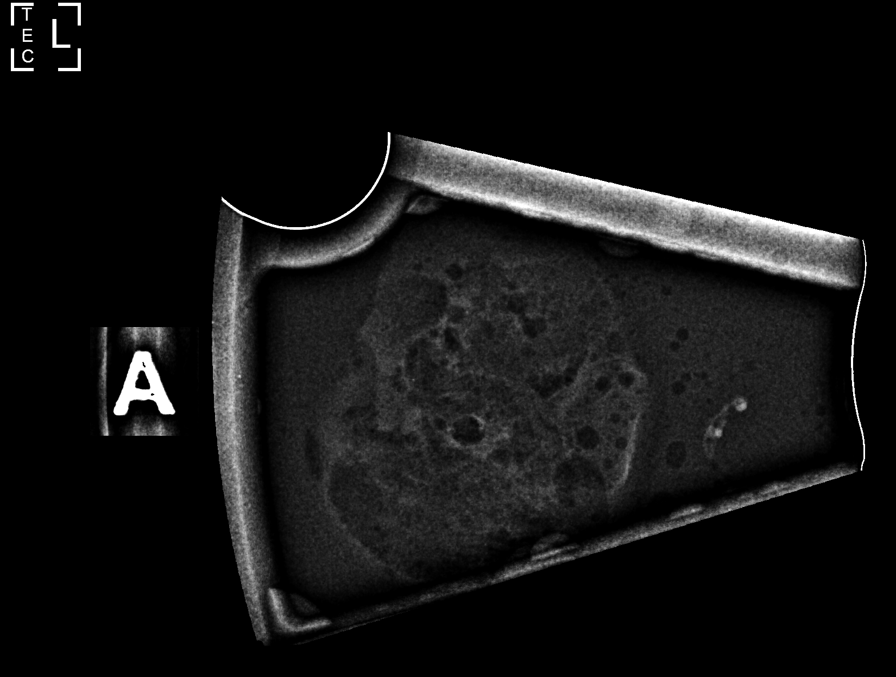

[L (4 of 6)]
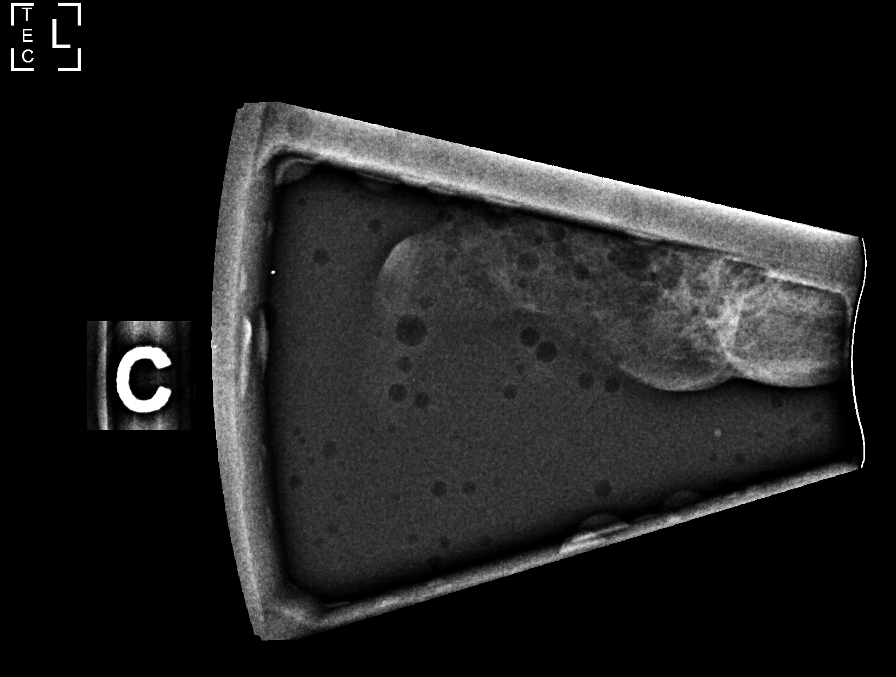

[L (5 of 6)]
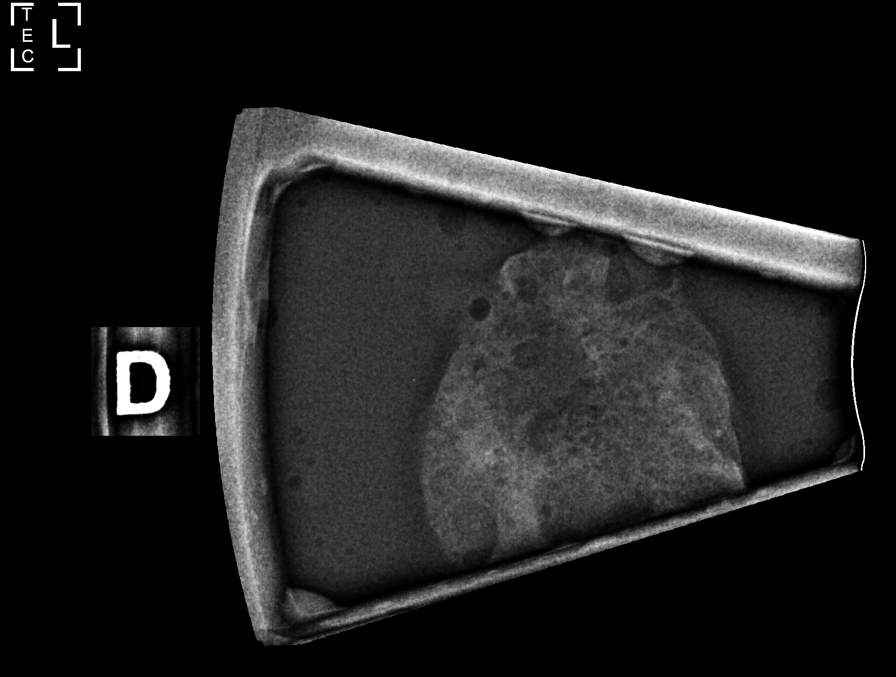

[L (6 of 6)]
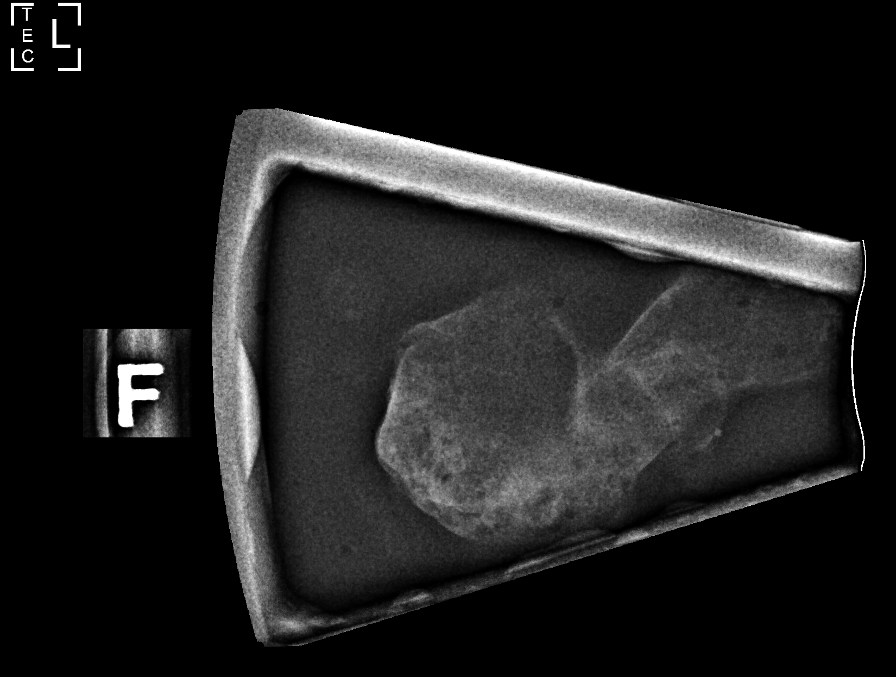

[L LM]
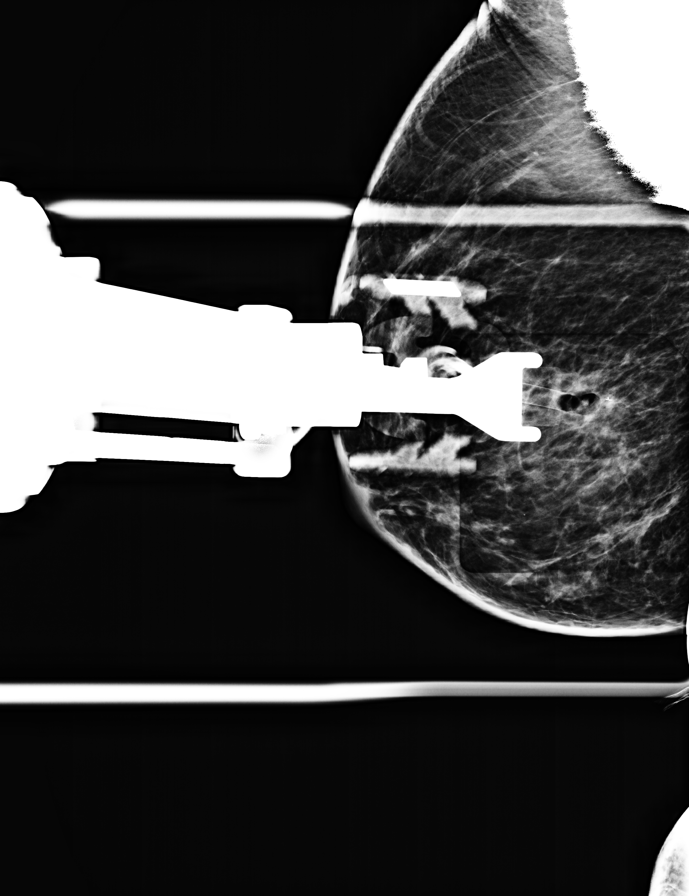

[L LM tomo · tomo slice 35/69.0]
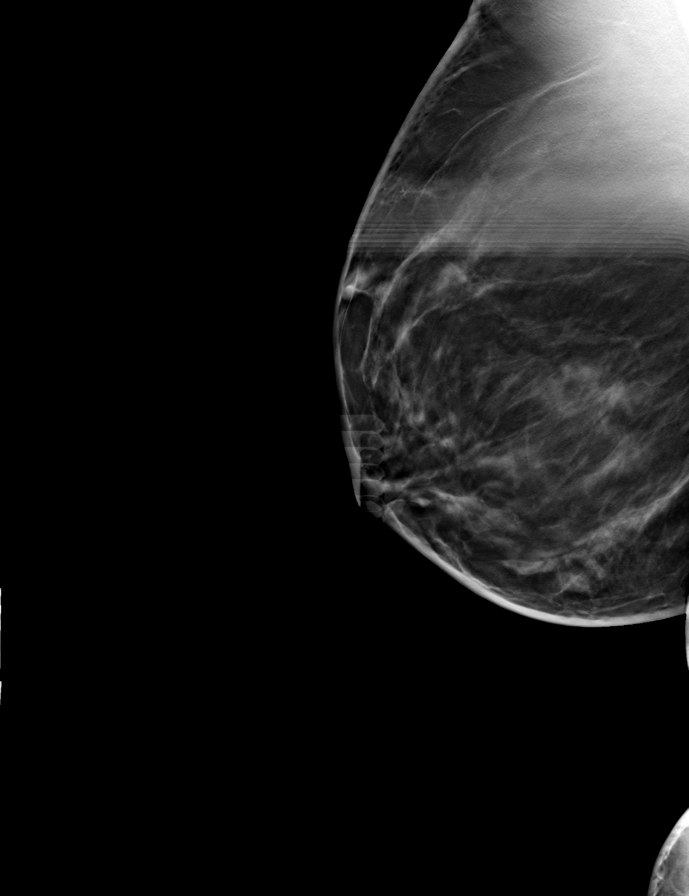

[8 of 22 positions shown; findings below may reference images not displayed]



Using sterile technique and 1% Lidocaine as local anesthetic, under
stereotactic guidance, a 9 gauge vacuum assisted device was used to
perform core needle biopsy of distortion in the lateral left breast
using a lateral approach.

Lesion quadrant: Lateral

At the conclusion of the procedure, an X shaped tissue marker clip
was deployed into the biopsy cavity. Follow-up 2-view mammogram was
performed and dictated separately.
IMPRESSION: Stereotactic-guided biopsy of distortion in the lateral left breast.
No apparent complications.

ADDENDUM:
Pathology revealed GRADE 1 INVASIVE LOBULAR CARCINOMA. THE LINEAR
EXTENT OF CARCINOMA IN THE LONGEST CORE: 4 MM. THERE IS AN
ACCOMPANYING COMPONENT OF LOBULAR CARCINOMA IN SITU of the LEFT
breast, lateral (X clip). This was found to be concordant by Dr.
GUGU.

Pathology results were discussed with the patient by telephone. The
patient reported doing well after the biopsy with tenderness at the
site. Post biopsy instructions and care were reviewed and questions
were answered. The patient was encouraged to call The [REDACTED]

Surgical consultation has been arranged with Dr. GUGU
at [REDACTED] on [DATE].

Consider breast MRI given lobular histology.

Pathology results reported by GUGU RN on [DATE].



Using sterile technique and 1% Lidocaine as local anesthetic, under
stereotactic guidance, a 9 gauge vacuum assisted device was used to
perform core needle biopsy of distortion in the lateral left breast
using a lateral approach.

Lesion quadrant: Lateral

At the conclusion of the procedure, an X shaped tissue marker clip
was deployed into the biopsy cavity. Follow-up 2-view mammogram was
performed and dictated separately.
IMPRESSION: Stereotactic-guided biopsy of distortion in the lateral left breast.
No apparent complications.

## 2021-06-27 IMAGING — MG MM BREAST LOCALIZATION CLIP
4 series · 4 of 12 positions shown · non-contrast
Comparison: Previous exam(s).

CLINICAL DATA: Evaluate biopsy marker

EXAM:
3D DIAGNOSTIC LEFT MAMMOGRAM POST STEREOTACTIC BIOPSY

[L LM synth-2D]
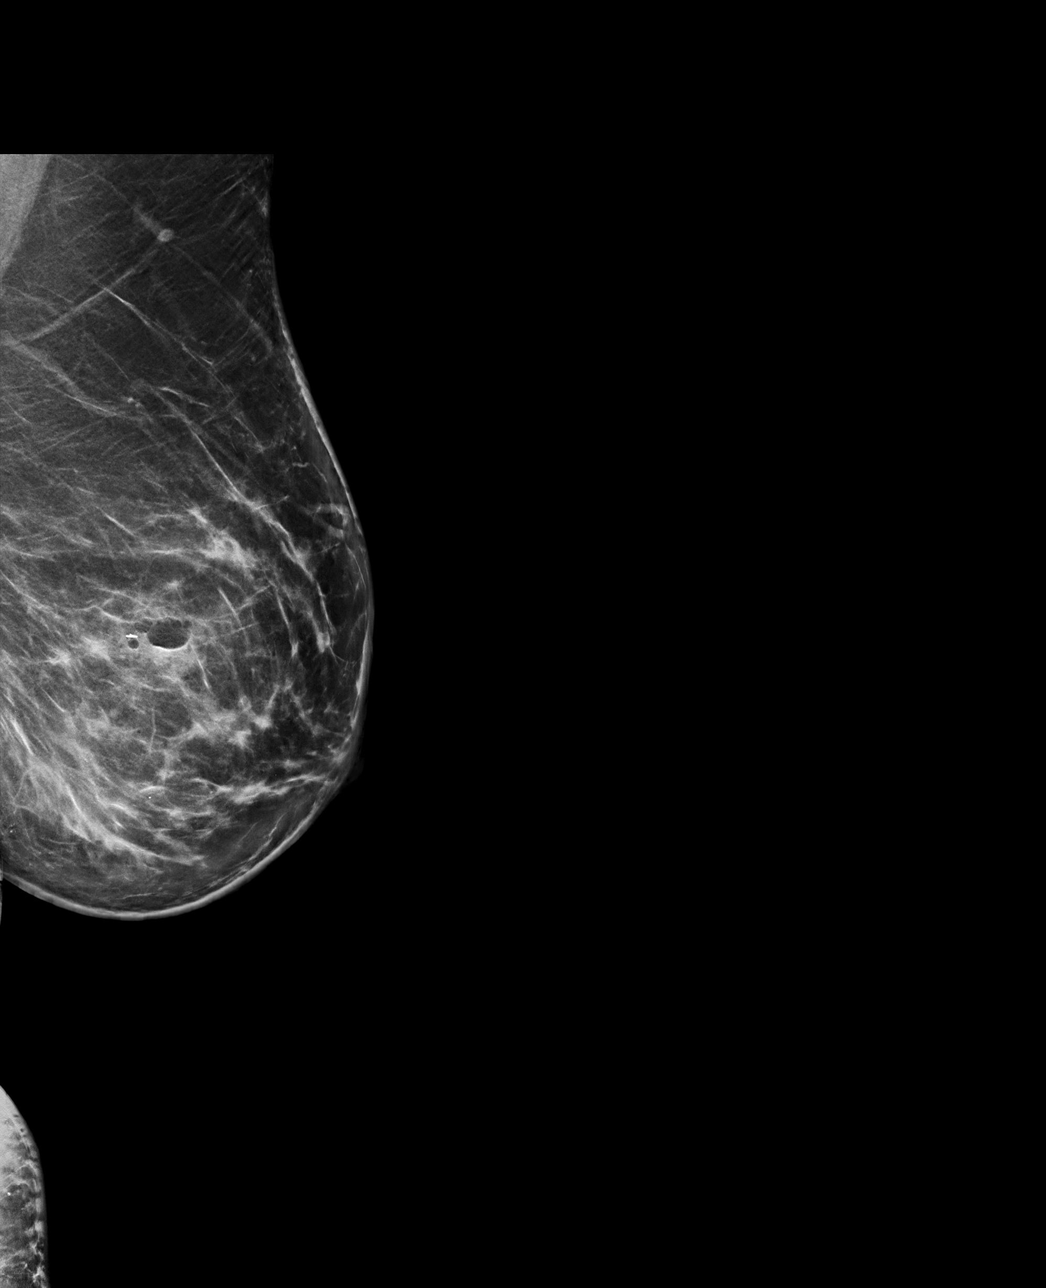

[L CC synth-2D]
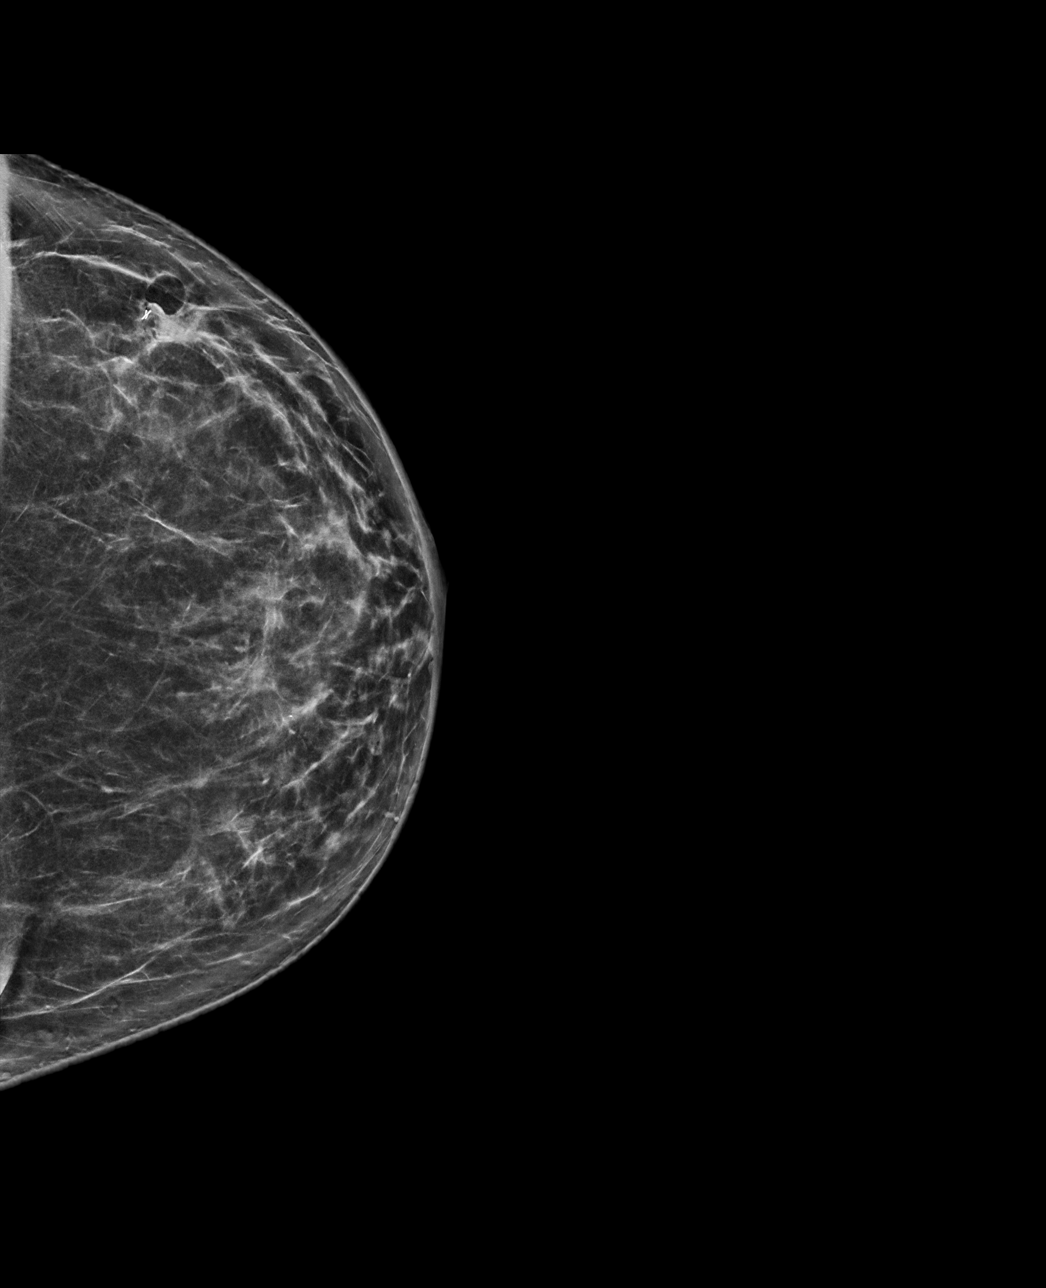

[L CC tomo · tomo slice 37/73.0]
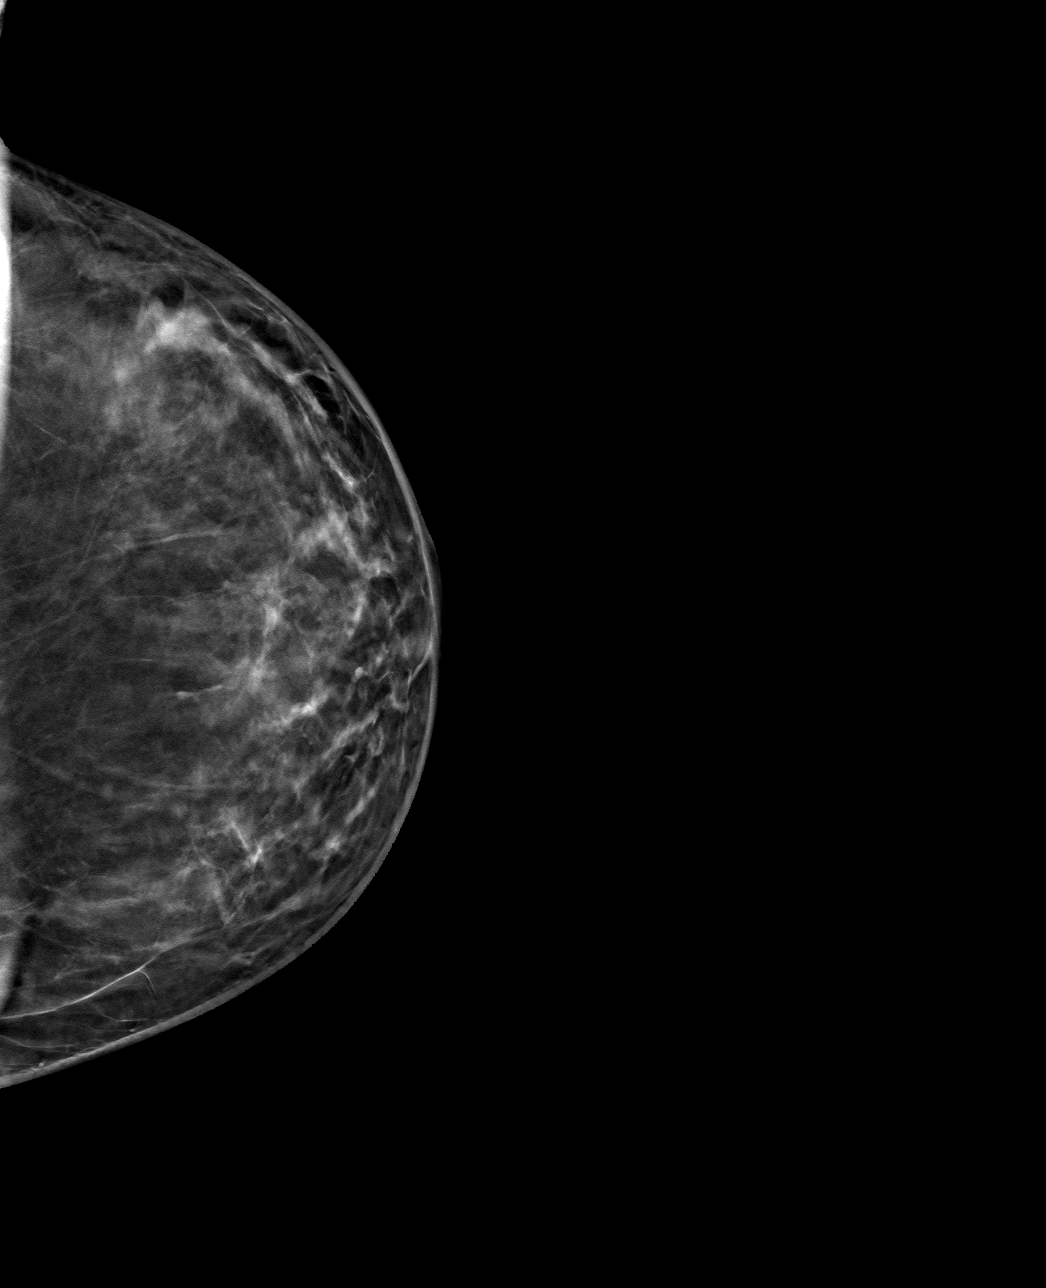

[L LM tomo · tomo slice 49/97.0]
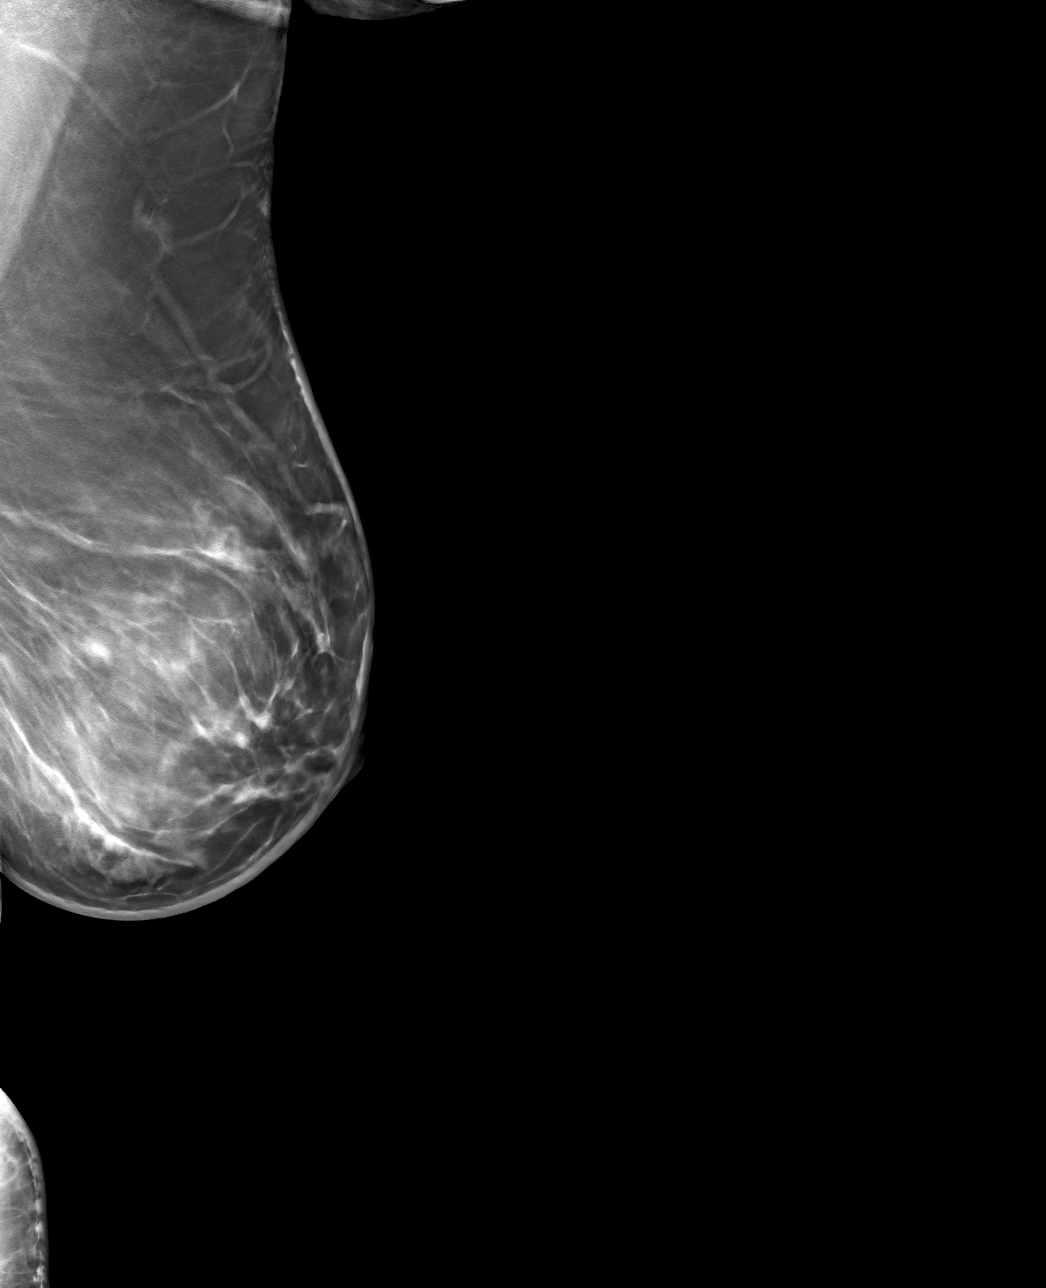

[4 of 12 positions shown; findings below may reference images not displayed]

FINDINGS: 3D Mammographic images were obtained following stereotactic guided
biopsy of left breast distortion. The biopsy marking clip is in
expected position at the site of biopsy.
IMPRESSION: Appropriate positioning of the X shaped biopsy marking clip at the
site of biopsy in the posterior aspect of the biopsied left breast
distortion.

Final Assessment: Post Procedure Mammograms for Marker Placement

## 2021-06-28 ENCOUNTER — Telehealth: Payer: 59 | Admitting: Nurse Practitioner

## 2021-06-28 DIAGNOSIS — R399 Unspecified symptoms and signs involving the genitourinary system: Secondary | ICD-10-CM

## 2021-06-28 MED ORDER — NITROFURANTOIN MONOHYD MACRO 100 MG PO CAPS: 100.0000 mg | ORAL_CAPSULE | Freq: Two times a day (BID) | ORAL | 0 refills | Status: AC

## 2021-06-28 NOTE — Progress Notes (Signed)
E-Visit for Urinary Problems ? ?We are sorry that you are not feeling well.  Here is how we plan to help! ? ?Based on what you shared with me it looks like you most likely have a simple urinary tract infection. ? ?A UTI (Urinary Tract Infection) is a bacterial infection of the bladder. ? ?Most cases of urinary tract infections are simple to treat but a key part of your care is to encourage you to drink plenty of fluids and watch your symptoms carefully. ? ?I have prescribed MacroBid 100 mg twice a day for 5 days.  Your symptoms should gradually improve. Call us if the burning in your urine worsens, you develop worsening fever, back pain or pelvic pain or if your symptoms do not resolve after completing the antibiotic. ? ?Urinary tract infections can be prevented by drinking plenty of water to keep your body hydrated.  Also be sure when you wipe, wipe from front to back and don't hold it in!  If possible, empty your bladder every 4 hours. ? ?HOME CARE ?Drink plenty of fluids ?Compete the full course of the antibiotics even if the symptoms resolve ?Remember, when you need to go?go. Holding in your urine can increase the likelihood of getting a UTI! ?GET HELP RIGHT AWAY IF: ?You cannot urinate ?You get a high fever ?Worsening back pain occurs ?You see blood in your urine ?You feel sick to your stomach or throw up ?You feel like you are going to pass out ? ?MAKE SURE YOU  ?Understand these instructions. ?Will watch your condition. ?Will get help right away if you are not doing well or get worse. ? ? ?Thank you for choosing an e-visit. ? ?Your e-visit answers were reviewed by a board certified advanced clinical practitioner to complete your personal care plan. Depending upon the condition, your plan could have included both over the counter or prescription medications. ? ?Please review your pharmacy choice. Make sure the pharmacy is open so you can pick up prescription now. If there is a problem, you may contact your  provider through [employer] and have the prescription routed to another pharmacy.  Your safety is important to [place]. If you have drug allergies check your prescription carefully.  ? ?For the next 24 hours you can use MyChart to ask questions about today's visit, request a non-urgent call back, or ask for a work or school excuse. ?You will get an email in the next two days asking about your experience. I hope that your e-visit has been valuable and will speed your recovery.  ?

## 2021-06-28 NOTE — Progress Notes (Signed)
I have spent 5 minutes in review of e-visit questionnaire, review and updating patient chart, medical decision making and response to patient.  ° °Suezette Lafave W Jalisha Enneking, NP ° °  °

## 2021-06-30 ENCOUNTER — Encounter (INDEPENDENT_AMBULATORY_CARE_PROVIDER_SITE_OTHER): Payer: Self-pay | Admitting: Family Medicine

## 2021-06-30 ENCOUNTER — Other Ambulatory Visit (HOSPITAL_COMMUNITY): Payer: Self-pay

## 2021-06-30 NOTE — Progress Notes (Signed)
?TeleHealth Visit:  ?Due to the COVID-19 pandemic, this visit was completed with telemedicine (audio/video) technology to reduce patient and provider exposure as well as to preserve personal protective equipment.  ? ?Elizabeth Cummings has verbally consented to this TeleHealth visit. The patient is located at home, the provider is located at home. The participants in this visit include the listed provider and patient. The visit was conducted today via MyChart video. ? ?OBESITY ?Elizabeth Cummings is here to discuss her progress with her obesity treatment plan along with follow-up of her obesity related diagnoses.  ? ?Today's visit was # 9 ?Starting weight: 231 lbs ?Starting date: 11/14/20 ?Total weight loss: 29 ?Weight reported at last virtual office visit: 202.6 ?Today's reported weight: 202.8 lbs  ?Weight change since last visit: 0 ? ? ?Nutrition Plan: keeping a food journal and adhering to recommended goals of 1200-1300 calories and 90 gms protein.  ?Hunger is moderately controlled. Cravings are moderately controlled.  ?Current exercise: none ? ?Interim History:  ?Elizabeth Cummings notes she has not done well on the plan recently.  She had a breast biopsy and has been nervous about the results.  She tends to do better on the plan at breakfast and lunch.  She generally eats in the cafeteria at work but tries to make healthy choices such as a salad or sandwich.  Has sweet tea or sweet tea once weekly. She and her husband eat out 5 nights per week.  ?She notes increased cravings and hunger recently. ? ? ?Assessment/Plan:  ?1. Type II Diabetes ?HgbA1c is at goal. ?CBGs: Not checking ?Current monitoring regimen: none ?Any episodes of hypoglycemia? no ?Medication(s): Mounjaro 10 mg weekly.  We are trying to increase her to 12.5 mg but his has not been available. ? ?Lab Results  ?Component Value Date  ? HGBA1C 6.3 (H) 06/12/2021  ? HGBA1C 6.6 03/14/2021  ? HGBA1C 6.4 (H) 11/14/2020  ? ?Lab Results  ?Component Value Date  ? [place] 86  03/14/2021  ? CREATININE 0.7 03/14/2021  ? ? ?Plan: ?She says the pharmacy has the 12.5 mg dose available but we are waiting on prior authorization.  If she can get it she will take the 12.5 mg dose ? ?2. Hypertension ?Hypertension well controlled.  Blood pressure at home today was 118/84. Pulse was 98 ?Medication(s): HCTZ 25 mg daily ? ? ?BP Readings from Last 3 Encounters:  ?04/30/21 132/84  ?04/02/21 105/74  ?02/25/21 106/72  ? ?Lab Results  ?Component Value Date  ? CREATININE 0.7 03/14/2021  ? CREATININE 0.66 11/14/2020  ? ? ?Plan: ?Current treatment plan is effective, no change in therapy.. ? ?Obesity: Current BMI 34.74 ?Elizabeth Cummings is currently in the action stage of change. As such, her goal is to continue with weight loss efforts.  ?She has agreed to keeping a food journal and adhering to recommended goals of 1200-1300 calories and 90 gms protein.  ? ?Exercise goals: Encouraged her to do some walking.  She is planning on walking on her break with a friend at work. ? ?Behavioral modification strategies: increasing lean protein intake, decreasing simple carbohydrates, and decreasing eating out. ?I encouraged her to take current dressing to work to put on salads or when dining out. ?Encouraged her to pack her lunch for work. ?Encouraged her to try to stick to plan at least 3 nights per week when eating out. ? ?Elizabeth Cummings has agreed to follow-up with our clinic in 4 weeks.  ? ?No orders of the defined types were placed in this encounter. ? ? ?There  are no discontinued medications.  ? ?No orders of the defined types were placed in this encounter. ?   ? ?Objective:  ? ?VITALS: Per patient if applicable, see vitals. ?GENERAL: Alert and in no acute distress. ?CARDIOPULMONARY: No increased WOB. Speaking in clear sentences.  ?PSYCH: Pleasant and cooperative. Speech normal rate and rhythm. Affect is appropriate. Insight and judgement are appropriate. Attention is focused, linear, and appropriate.  ?NEURO: Oriented as  arrived to appointment on time with no prompting.  ? ?Lab Results  ?Component Value Date  ? CREATININE 0.7 03/14/2021  ? BUN 10 03/14/2021  ? NA 139 03/14/2021  ? K 3.6 03/14/2021  ? CL 99 03/14/2021  ? CO2 28 (A) 03/14/2021  ? ?Lab Results  ?Component Value Date  ? ALT 17 03/14/2021  ? AST 17 03/14/2021  ? ALKPHOS 97 03/14/2021  ? BILITOT 0.3 11/14/2020  ? ?Lab Results  ?Component Value Date  ? HGBA1C 6.3 (H) 06/12/2021  ? HGBA1C 6.6 03/14/2021  ? HGBA1C 6.4 (H) 11/14/2020  ? HGBA1C 6.4 12/27/2014  ? HGBA1C 6.3 09/06/2014  ? ?Lab Results  ?Component Value Date  ? INSULIN 20.3 11/14/2020  ? ?Lab Results  ?Component Value Date  ? TSH 0.785 11/14/2020  ? ?Lab Results  ?Component Value Date  ? CHOL 156 03/14/2021  ? HDL 49 03/14/2021  ? [place] 86 03/14/2021  ? TRIG 121 03/14/2021  ? ?Lab Results  ?Component Value Date  ? WBC 13.2 03/14/2021  ? HGB 12.7 03/14/2021  ? HCT 38 03/14/2021  ? MCV 83 11/14/2020  ? PLT 419 (A) 03/14/2021  ? ?Lab Results  ?Component Value Date  ? IRON 58 05/08/2014  ? TIBC 506 (H) 05/08/2014  ? FERRITIN 36 05/08/2014  ? ?Lab Results  ?Component Value Date  ? VD25OH 50.3 06/12/2021  ? VD25OH 50.9 11/14/2020  ? ? ?Attestation Statements:  ? ?Reviewed by clinician on day of visit: allergies, medications, problem list, medical history, surgical history, family history, social history, and previous encounter notes. ? ?Time spent on visit including pre-visit chart review and post-visit charting and care was 33 minutes.  ? ? ?

## 2021-06-30 NOTE — Telephone Encounter (Signed)
Prior authorization has already been started for [facility]. Waiting on response from insurance. ?

## 2021-06-30 NOTE — Telephone Encounter (Signed)
Please check PA, Thanks

## 2021-07-01 ENCOUNTER — Encounter (INDEPENDENT_AMBULATORY_CARE_PROVIDER_SITE_OTHER): Payer: Self-pay | Admitting: Family Medicine

## 2021-07-01 ENCOUNTER — Telehealth (INDEPENDENT_AMBULATORY_CARE_PROVIDER_SITE_OTHER): Payer: 59 | Admitting: Family Medicine

## 2021-07-01 VITALS — Ht 64.0 in | Wt 202.5 lb

## 2021-07-01 DIAGNOSIS — E1159 Type 2 diabetes mellitus with other circulatory complications: Secondary | ICD-10-CM

## 2021-07-01 DIAGNOSIS — Z7985 Long-term (current) use of injectable non-insulin antidiabetic drugs: Secondary | ICD-10-CM | POA: Diagnosis not present

## 2021-07-01 DIAGNOSIS — E669 Obesity, unspecified: Secondary | ICD-10-CM

## 2021-07-01 DIAGNOSIS — Z6834 Body mass index (BMI) 34.0-34.9, adult: Secondary | ICD-10-CM

## 2021-07-01 DIAGNOSIS — I152 Hypertension secondary to endocrine disorders: Secondary | ICD-10-CM | POA: Diagnosis not present

## 2021-07-01 DIAGNOSIS — E1169 Type 2 diabetes mellitus with other specified complication: Secondary | ICD-10-CM | POA: Diagnosis not present

## 2021-07-01 NOTE — Telephone Encounter (Signed)
Please see message and advise.  Thank you. ° °

## 2021-07-03 ENCOUNTER — Other Ambulatory Visit (HOSPITAL_COMMUNITY): Payer: Self-pay

## 2021-07-03 ENCOUNTER — Telehealth (INDEPENDENT_AMBULATORY_CARE_PROVIDER_SITE_OTHER): Payer: Self-pay | Admitting: Family Medicine

## 2021-07-03 NOTE — Telephone Encounter (Signed)
Please review

## 2021-07-03 NOTE — Telephone Encounter (Addendum)
I can't change it to urgent, prior authorization is already being processed. Provider has not made this a urgent request. Patient will have to wait until insurance process prior authorization. Patient needs to be made aware that prior authorization can take several days to process and we don't do urgent request at the request of patients. Prior authorization request was received from covermymeds on Monday 06/30/21. Patient name was not correct as it appears on insurance card, I resent PA on 07/02/21 as it appears on insurance card. Thanks! ?

## 2021-07-03 NOTE — Telephone Encounter (Signed)
Can you double check on this for the pt? Thanks

## 2021-07-03 NOTE — Telephone Encounter (Signed)
Prior authorization already started for [facility] with the dx of type 2 diabetes. Will notify patient and provider once response is received.  ?

## 2021-07-03 NOTE — Telephone Encounter (Signed)
FYI

## 2021-07-04 ENCOUNTER — Other Ambulatory Visit (HOSPITAL_COMMUNITY): Payer: Self-pay

## 2021-07-05 ENCOUNTER — Telehealth: Payer: 59 | Admitting: Physician Assistant

## 2021-07-05 ENCOUNTER — Encounter: Payer: Self-pay | Admitting: Physician Assistant

## 2021-07-05 ENCOUNTER — Other Ambulatory Visit: Payer: Self-pay | Admitting: Physician Assistant

## 2021-07-05 DIAGNOSIS — N3000 Acute cystitis without hematuria: Secondary | ICD-10-CM

## 2021-07-05 MED ORDER — SULFAMETHOXAZOLE-TRIMETHOPRIM 800-160 MG PO TABS: 1.0000 | ORAL_TABLET | Freq: Two times a day (BID) | ORAL | 0 refills | Status: AC

## 2021-07-05 NOTE — Progress Notes (Signed)
Medication sent.

## 2021-07-05 NOTE — Progress Notes (Signed)
E-Visit for Urinary Problems ? ?We are sorry that you are not feeling well.  Here is how we plan to help! ? ?Based on what you shared with me it looks like you most likely have a simple urinary tract infection. ? ?A UTI (Urinary Tract Infection) is a bacterial infection of the bladder. ? ?Most cases of urinary tract infections are simple to treat but a key part of your care is to encourage you to drink plenty of fluids and watch your symptoms carefully. ? ?I have prescribed Bactrim DS One tablet twice a day for 5 days.  Your symptoms should gradually improve. Call us if the burning in your urine worsens, you develop worsening fever, back pain or pelvic pain or if your symptoms do not resolve after completing the antibiotic. ? ?Urinary tract infections can be prevented by drinking plenty of water to keep your body hydrated.  Also be sure when you wipe, wipe from front to back and don't hold it in!  If possible, empty your bladder every 4 hours. ? ?HOME CARE ?Drink plenty of fluids ?Compete the full course of the antibiotics even if the symptoms resolve ?Remember, when you need to go?go. Holding in your urine can increase the likelihood of getting a UTI! ?GET HELP RIGHT AWAY IF: ?You cannot urinate ?You get a high fever ?Worsening back pain occurs ?You see blood in your urine ?You feel sick to your stomach or throw up ?You feel like you are going to pass out ? ?MAKE SURE YOU  ?Understand these instructions. ?Will watch your condition. ?Will get help right away if you are not doing well or get worse. ? ? ?Thank you for choosing an e-visit. ? ?Your e-visit answers were reviewed by a board certified advanced clinical practitioner to complete your personal care plan. Depending upon the condition, your plan could have included both over the counter or prescription medications. ? ?Please review your pharmacy choice. Make sure the pharmacy is open so you can pick up prescription now. If there is a problem, you may contact  your provider through [employer] and have the prescription routed to another pharmacy.  Your safety is important to [place]. If you have drug allergies check your prescription carefully.  ? ?For the next 24 hours you can use MyChart to ask questions about today's visit, request a non-urgent call back, or ask for a work or school excuse. ?You will get an email in the next two days asking about your experience. I hope that your e-visit has been valuable and will speed your recovery. ? ?I have spent 5 minutes in review of e-visit questionnaire, review and updating patient chart, medical decision making and response to patient.  ? ?Jalila Goodnough S Mayers, PA-C ? ? ? ? ?

## 2021-07-07 ENCOUNTER — Telehealth (INDEPENDENT_AMBULATORY_CARE_PROVIDER_SITE_OTHER): Payer: Self-pay | Admitting: Family Medicine

## 2021-07-07 ENCOUNTER — Encounter (INDEPENDENT_AMBULATORY_CARE_PROVIDER_SITE_OTHER): Payer: Self-pay | Admitting: Family Medicine

## 2021-07-07 ENCOUNTER — Encounter (INDEPENDENT_AMBULATORY_CARE_PROVIDER_SITE_OTHER): Payer: Self-pay

## 2021-07-07 NOTE — Telephone Encounter (Signed)
Please see message . Thank you .

## 2021-07-07 NOTE — Telephone Encounter (Signed)
Prior authorization approved for [facility]. Effective: 07/04/2021 to 07/04/2022. Patient sent approval message via mychart.  ?

## 2021-07-08 DIAGNOSIS — Z170 Estrogen receptor positive status [ER+]: Secondary | ICD-10-CM | POA: Diagnosis not present

## 2021-07-08 DIAGNOSIS — J029 Acute pharyngitis, unspecified: Secondary | ICD-10-CM | POA: Diagnosis not present

## 2021-07-08 DIAGNOSIS — C50412 Malignant neoplasm of upper-outer quadrant of left female breast: Secondary | ICD-10-CM | POA: Diagnosis not present

## 2021-07-08 DIAGNOSIS — R509 Fever, unspecified: Secondary | ICD-10-CM | POA: Diagnosis not present

## 2021-07-09 ENCOUNTER — Other Ambulatory Visit: Payer: Self-pay | Admitting: General Surgery

## 2021-07-09 ENCOUNTER — Telehealth: Payer: Self-pay | Admitting: Hematology and Oncology

## 2021-07-09 ENCOUNTER — Other Ambulatory Visit (HOSPITAL_COMMUNITY): Payer: Self-pay | Admitting: General Surgery

## 2021-07-09 DIAGNOSIS — Z170 Estrogen receptor positive status [ER+]: Secondary | ICD-10-CM

## 2021-07-09 NOTE — Therapy (Signed)
?OUTPATIENT PHYSICAL THERAPY BREAST CANCER BASELINE EVALUATION ? ? ?Patient Name: Elizabeth Cummings ?MRN: [id number] ?DOB:08/06/1975, 46 y.o., female ?Today's Date: 07/10/2021 ? ? PT End of Session - 07/10/21 1151   ? ? Visit Number 1   ? Number of Visits 2   ? Date for PT Re-Evaluation 09/04/21   ? PT Start Time 1103   ? PT Stop Time 1151   ? PT Time Calculation (min) 48 min   ? Activity Tolerance Patient tolerated treatment well   ? Behavior During Therapy [facility] for tasks assessed/performed   ? ?  ?  ? ?  ? ? ?Past Medical History:  ?Diagnosis Date  ? Allergy   ? seasonal, dogs, cats  ? Angioneurotic edema 05/08/2014  ? Leukocytosis 05/08/2014  ? Microcytic anemia 05/08/2014  ? Thrombocytosis 05/08/2014  ? ?Past Surgical History:  ?Procedure Laterality Date  ? APPENDECTOMY    ? CESAREAN SECTION    ? CHOLECYSTECTOMY    ? WRIST SURGERY    ? ?Patient Active Problem List  ? Diagnosis Date Noted  ? Malignant neoplasm of upper-outer quadrant of left breast in female, estrogen receptor positive ([place]) 07/10/2021  ? Hypertension associated with diabetes ([place]) 07/01/2021  ? Migraine without status migrainosus, not intractable 04/02/2021  ? Hyperlipidemia associated with type 2 diabetes mellitus ([place]) 01/28/2021  ? Diabetes mellitus ([place]) 01/01/2021  ? Class 2 severe obesity with serious comorbidity and body mass index (BMI) of 36.0 to 36.9 in adult [facility]) 01/01/2021  ? Celiac disease 07/31/2019  ? Prediabetes 03/02/2016  ? Microcytic anemia 05/08/2014  ? Leukocytosis 05/08/2014  ? Thrombocytosis 05/08/2014  ? Angioneurotic edema 05/08/2014  ? ? ?PCP: Mayra Neer, MD ? ?REFERRING PROVIDER: Rolm Bookbinder, MD ? ?REFERRING DIAG: Left Breast Cancer ? ?THERAPY DIAG:  ?Malignant neoplasm of upper-outer quadrant of left breast in female, estrogen receptor positive ([place]) ? ?Abnormal posture ? ?ONSET DATE: 06/27/2021 ? ?SUBJECTIVE                                                                                                                                                                                           ? ?SUBJECTIVE STATEMENT: ?Patient reports she is here today to be seen by her medical team for her newly diagnosed left breast cancer.  ? ?PERTINENT HISTORY:  ?Patient was diagnosed on 06/27/2021 with left grade 1 Invasive Lobular Carcinoma. It measures 4 mm and is located in the upper outer quadrant. It is ER+, PR+, HER 2- with a Ki67 of 4%.  ? ?PATIENT GOALS   reduce lymphedema risk and learn post op HEP.  ? ?PAIN:  ?Are you having pain?  No ? ? ?PRECAUTIONS: Active CA , Celiac disease, DM ?HAND DOMINANCE: right ? ?WEIGHT BEARING RESTRICTIONS No ? ?FALLS:  ?Has patient fallen in last 6 months? No ? ?LIVING ENVIRONMENT: ?Patient lives with: husband ?Lives in: House/apartment ?Has following equipment at home: None ? ?OCCUPATION: RN ? ?LEISURE: none ? ?PRIOR LEVEL OF FUNCTION: Independent ? ? ?OBJECTIVE ? ?COGNITION: ? Overall cognitive status: Within functional limits for tasks assessed   ? ?POSTURE:  ?Forward head and rounded shoulders posture ? ?UPPER EXTREMITY AROM/PROM: ? ?A/PROM RIGHT  07/10/2021 ?  ?Shoulder extension 62  ?Shoulder flexion 163  ?Shoulder abduction 180  ?Shoulder internal rotation 68  ?Shoulder external rotation 99  ?  (Blank rows = not tested) ? ?A/PROM LEFT  07/10/2021  ?Shoulder extension 61  ?Shoulder flexion 170  ?Shoulder abduction 180  ?Shoulder internal rotation 70  ?Shoulder external rotation 99  ?  (Blank rows = not tested) ? ? ?CERVICAL AROM: ?All within normal limits:  ? ? ? ?UPPER EXTREMITY STRENGTH: WNL ? ? ?LYMPHEDEMA ASSESSMENTS:  ? ?[place] RIGHT  07/10/2021  ?10 cm proximal to olecranon process 32.6  ?Olecranon process 26.8  ?10 cm proximal to ulnar styloid process 23.8  ?Just proximal to ulnar styloid process 16.6  ?Across hand at thumb web space 19.4  ?At base of 2nd digit 5.7  ?(Blank rows = not tested) ? ?[place] LEFT  07/10/2021  ?10 cm proximal to olecranon process 33.0  ?Olecranon process 26.5  ?10  cm proximal to ulnar styloid process 22.9  ?Just proximal to ulnar styloid process 16.8  ?Across hand at thumb web space 19.0  ?At base of 2nd digit 5.8  ?(Blank rows = not tested) ? ? ?L-DEX LYMPHEDEMA SCREENING: ? ?The patient was assessed using the L-Dex machine today to produce a lymphedema index baseline score. The patient will be reassessed on a regular basis (typically every 3 months) to obtain new L-Dex scores. If the score is > 6.5 points away from his/her baseline score indicating onset of subclinical lymphedema, it will be recommended to wear a compression garment for 4 weeks, 12 hours per day and then be reassessed. If the score continues to be > 6.5 points from baseline at reassessment, we will initiate lymphedema treatment. Assessing in this manner has a 95% rate of preventing clinically significant lymphedema. ? ? L-DEX FLOWSHEETS - 07/10/21 1100   ? ?  ? L-DEX LYMPHEDEMA SCREENING  ? Measurement Type Unilateral   ? L-DEX MEASUREMENT EXTREMITY Upper Extremity   ? POSITION  Standing   ? DOMINANT SIDE Right   ? At Risk Side Left   ? BASELINE SCORE (UNILATERAL) 1.5   ? ?  ?  ? ?  ? ? ? ?QUICK DASH SURVEY:0% ? ? ?PATIENT EDUCATION:  ?Education details: Lymphedema risk reduction and post op shoulder/posture HEP, SOZO screens ?Person educated: Patient ?Education method: Explanation, Demonstration, Handout ?Education comprehension: Patient verbalized understanding and returned demonstration ? ? ?HOME EXERCISE PROGRAM: ?Patient was instructed today in a home exercise program today for post op shoulder range of motion. These included active assist shoulder flexion in sitting, scapular retraction, wall walking with shoulder abduction, and hands behind head external rotation.  She was encouraged to do these twice a day, holding 3 seconds and repeating 5 times when permitted by her physician. ? ? ?ASSESSMENT: ? ?CLINICAL IMPRESSION: ?Pts. multidisciplinary medical team met prior to her assessments to determine a  recommended treatment plan. She is planning to have an MRI on Monday which will  determine what type of surgery she will have.Marland Kitchen She will benefit from a post op PT reassessment to determine needs and from L-Dex screens every 3 months for 2 years to detect subclinical lymphedema. ? ?Pt will benefit from skilled therapeutic intervention to improve on the following deficits: Decreased knowledge of precautions, impaired UE functional use, pain, decreased ROM, postural dysfunction.  ? ?PT treatment/interventions: ADL/self-care home management, pt/family education, therapeutic exercise ? ?REHAB POTENTIAL: Excellent ? ?CLINICAL DECISION MAKING: Stable/uncomplicated ? ?EVALUATION COMPLEXITY: Low ? ? ?GOALS: ?Goals reviewed with patient? YES ? ?LONG TERM GOALS: (STG=LTG) ? ? Name Target Date Goal status  ?1 Pt will be able to verbalize understanding of pertinent lymphedema risk reduction practices relevant to her dx specifically related to skin care.  ?Baseline:  No knowledge 07/10/2021 Achieved at eval  ?2 Pt will be able to return demo and/or verbalize understanding of the post op HEP related to regaining shoulder ROM. ?Baseline:  No knowledge 07/10/2021 Achieved at eval  ?3 Pt will be able to verbalize understanding of the importance of attending the post op After Breast CA Class for further lymphedema risk reduction education and therapeutic exercise.  ?Baseline:  No knowledge 07/10/2021 Achieved at eval  ?4 Pt will demo she has regained full shoulder ROM and function post operatively compared to baselines.  ?Baseline: See objective measurements taken today. 09/04/2021   ? ? ? ?PLAN: ?PT FREQUENCY/DURATION: EVAL and 1 follow up appointment.  ? ?PLAN FOR NEXT SESSION: will reassess 3-4 weeks post op to determine needs. ?  ?Patient will follow up at outpatient cancer rehab 3-4 weeks following surgery.  If the patient requires physical therapy at that time, a specific plan will be dictated and sent to the referring physician for  approval. ?The patient was educated today on appropriate basic range of motion exercises to begin post operatively and the importance of attending the After Breast Cancer class following surgery.  Pa

## 2021-07-09 NOTE — Telephone Encounter (Signed)
Scheduled appt per 4/19 referral. Pt is aware of appt date and time. Pt is aware to arrive 15 mins prior to appt time and to bring and updated insurance card. Pt is aware of appt location.   ?

## 2021-07-10 ENCOUNTER — Other Ambulatory Visit: Payer: Self-pay

## 2021-07-10 ENCOUNTER — Inpatient Hospital Stay: Payer: 59 | Attending: Hematology and Oncology | Admitting: Hematology and Oncology

## 2021-07-10 ENCOUNTER — Ambulatory Visit: Payer: 59 | Attending: General Surgery

## 2021-07-10 ENCOUNTER — Other Ambulatory Visit: Payer: Self-pay | Admitting: General Surgery

## 2021-07-10 DIAGNOSIS — C50412 Malignant neoplasm of upper-outer quadrant of left female breast: Secondary | ICD-10-CM

## 2021-07-10 DIAGNOSIS — Z170 Estrogen receptor positive status [ER+]: Secondary | ICD-10-CM | POA: Diagnosis not present

## 2021-07-10 DIAGNOSIS — R293 Abnormal posture: Secondary | ICD-10-CM | POA: Insufficient documentation

## 2021-07-10 NOTE — Progress Notes (Signed)
[place] ?CONSULT NOTE ? ?Patient Care Team: ?Mayra Neer, MD as PCP - General (Family Medicine) ?Annia Belt, MD as Consulting Physician (Oncology) ?Bensimhon, Shaune Pascal, MD as Consulting Physician (Cardiology) ?Tiajuana Amass, MD as Referring Physician (Allergy and Immunology) ? ?CHIEF COMPLAINTS/PURPOSE OF CONSULTATION:  ?Newly diagnosed breast cancer ? ?HISTORY OF PRESENTING ILLNESS:  ?Elizabeth Cummings 46 y.o. female is here because of recent diagnosis of left breast cancer.  Patient had a routine screening mammogram that detected an abnormality in the left breast which led to ultrasound.  There was no ultrasound correlate.  She underwent a stereotactic biopsy which came back as invasive lobular cancer grade 1 that was ER/PR positive HER2 negative with a Ki-67 of 4%.  She saw Dr. Donne Hazel who is obtaining a breast MRI and send her for genetic testing.  She was sent to [place] for discussion regarding adjuvant treatment options. ? ?I reviewed her records extensively and collaborated the history with the patient. ? ?SUMMARY OF ONCOLOGIC HISTORY: ?Oncology History  ?Malignant neoplasm of upper-outer quadrant of left breast in female, estrogen receptor positive ([place])  ?06/27/2021 Initial Diagnosis  ? Screening mammogram detected left breast asymmetry and distortion.  No ultrasound correlate.  Axilla negative, stereotactic biopsy showed grade 1 invasive lobular cancer with LCIS, negative for lymphovascular invasion, ER 90%, PR 80%, Ki-67 4%, HER2 equivocal by IHC, FISH negative, ratio 1.21 ?  ?07/10/2021 Cancer Staging  ? Staging form: Breast, AJCC 8th Edition ?- Clinical: Stage Unknown (cTX, cN0, cM0, G1, ER+, PR+, HER2-) - Signed by Nicholas Lose, MD on 07/10/2021 ?Stage prefix: Initial diagnosis ?Histologic grading system: 3 grade system ? ?  ? ? ? ?MEDICAL HISTORY:  ?Past Medical History:  ?Diagnosis Date  ? Allergy   ? seasonal, dogs, cats  ? Angioneurotic edema 05/08/2014  ? Leukocytosis  05/08/2014  ? Microcytic anemia 05/08/2014  ? Thrombocytosis 05/08/2014  ? ? ?SURGICAL HISTORY: ?Past Surgical History:  ?Procedure Laterality Date  ? APPENDECTOMY    ? CESAREAN SECTION    ? CHOLECYSTECTOMY    ? WRIST SURGERY    ? ? ?SOCIAL HISTORY: ?Social History  ? ?Socioeconomic History  ? Marital status: Single  ?  Spouse name: Not on file  ? Number of children: Not on file  ? Years of education: Not on file  ? Highest education level: Not on file  ?Occupational History  ? Occupation: Therapist, sports  ?Tobacco Use  ? Smoking status: Never  ? Smokeless tobacco: Never  ?Substance and Sexual Activity  ? Alcohol use: No  ?  Alcohol/week: 0.0 standard drinks  ? Drug use: No  ? Sexual activity: Not on file  ?Other Topics Concern  ? Not on file  ?Social History Narrative  ? Not on file  ? ?Social Determinants of Health  ? ?Financial Resource Strain: Not on file  ?Food Insecurity: Not on file  ?Transportation Needs: Not on file  ?Physical Activity: Not on file  ?Stress: Not on file  ?Social Connections: Not on file  ?Intimate Partner Violence: Not on file  ? ? ?FAMILY HISTORY: ?Family History  ?Problem Relation Age of Onset  ? Diabetes Mother   ? High blood pressure Mother   ? High Cholesterol Mother   ? Stroke Mother   ? Obesity Mother   ? High blood pressure Father   ? High Cholesterol Father   ? Alcoholism Father   ? Obesity Father   ? Hypertension Other   ? Hyperlipidemia Other   ?  Cancer Other   ? Stroke Other   ? Diabetes Other   ? ? ?ALLERGIES:  is allergic to dilaudid [hydromorphone hcl] and gluten meal. ? ?MEDICATIONS:  ?Current Outpatient Medications  ?Medication Sig Dispense Refill  ? amoxicillin-clavulanate (AUGMENTIN) 875-125 MG tablet Take 1 tablet by mouth 2 (two) times daily. 14 tablet 0  ? azelastine (ASTELIN) 0.1 % nasal spray Place 1-2 sprays into both nostrils 2 (two) times daily. 30 mL 5  ? benzonatate (TESSALON) 100 MG capsule Take 1 capsule (100 mg total) by mouth 3 (three) times daily as needed for cough. 30  capsule 0  ? calcium-vitamin D (OSCAL WITH D) 500-200 MG-UNIT per tablet Take 1 tablet by mouth daily.    ? COVID-19 At Home Antigen Test [facility] COVID-19 HOME TEST) KIT Use as directed 4 each 0  ? fexofenadine (ALLEGRA) 180 MG tablet Take 1 tablet Orally Once a day in the morning. (please fill with levocetirizine) 90 tablet 3  ? fluticasone (FLONASE) 50 MCG/ACT nasal spray Place 2 sprays into both nostrils daily. 16 g 6  ? hydrochlorothiazide (HYDRODIURIL) 25 MG tablet Take 1 tablet (25 mg total) by mouth daily for edema. 90 tablet 3  ? levocetirizine (XYZAL) 5 MG tablet Take 1 tablet (5 mg total) by mouth every evening. 90 tablet 3  ? Multiple Vitamin (MULTIVITAMIN) tablet Take 1 tablet by mouth daily.    ? norethindrone-ethinyl estradiol (DASETTA 7/7/7) 0.5/0.75/1-35 MG-MCG tablet Take 1 tablet by mouth daily. 84 tablet 4  ? pravastatin (PRAVACHOL) 20 MG tablet Take 1 tablet (20 mg total) by mouth 2 (two) times a week on Monday and thursday 90 tablet 0  ? sulfamethoxazole-trimethoprim (BACTRIM DS) 800-160 MG tablet Take 1 tablet by mouth 2 (two) times daily for 5 days. 10 tablet 0  ? tirzepatide (MOUNJARO) 12.5 MG/0.5ML Pen Inject 12.5 mg into the skin once a week. 2 mL 0  ? ?No current facility-administered medications for this visit.  ? ? ?REVIEW OF SYSTEMS:   ?Constitutional: Denies fevers, chills or abnormal night sweats ?  ?Breast:  Denies any palpable lumps or discharge ?All other systems were reviewed with the patient and are negative. ? ?PHYSICAL EXAMINATION: ?ECOG PERFORMANCE STATUS: 1 - Symptomatic but completely ambulatory ? ?Vitals:  ? 07/10/21 1335  ?BP: 124/62  ?Pulse: 96  ?Resp: 18  ?Temp: (!) 97.2 ?F (36.2 ?C)  ?SpO2: 97%  ? ?Filed Weights  ? 07/10/21 1335  ?Weight: 203 lb 1.6 oz (92.1 kg)  ?  ? ?LABORATORY DATA:  ?I have reviewed the data as listed ?Lab Results  ?Component Value Date  ? WBC 13.2 03/14/2021  ? HGB 12.7 03/14/2021  ? HCT 38 03/14/2021  ? MCV 83 11/14/2020  ? PLT 419 (A)  03/14/2021  ? ?Lab Results  ?Component Value Date  ? NA 139 03/14/2021  ? K 3.6 03/14/2021  ? CL 99 03/14/2021  ? CO2 28 (A) 03/14/2021  ? ? ?RADIOGRAPHIC STUDIES: ?I have personally reviewed the radiological reports and agreed with the findings in the report. ? ?ASSESSMENT AND PLAN:  ?Malignant neoplasm of upper-outer quadrant of left breast in female, estrogen receptor positive ([place]) ?06/27/2021: Screening mammogram detected left breast asymmetry and distortion.  No ultrasound correlate.  Axilla negative, stereotactic biopsy showed grade 1 invasive lobular cancer with LCIS, negative for lymphovascular invasion, ER 90%, PR 80%, Ki-67 4%, HER2 equivocal by IHC, FISH negative, ratio 1.21 ? ?Pathology and radiology counseling:Discussed with the patient, the details of pathology including the type of  breast cancer,the clinical staging, the significance of ER, PR and HER-2/neu receptors and the implications for treatment. After reviewing the pathology in detail, we proceeded to discuss the different treatment options between surgery, radiation, chemotherapy, antiestrogen therapies. ? ?Recommendations: ?1. Breast conserving surgery followed by ?2. Oncotype DX testing to determine if chemotherapy (depending on the final tumor size and grade) ?3. Adjuvant radiation therapy followed by ?4. Adjuvant antiestrogen therapy ? ?Oncotype counseling: I discussed Oncotype DX test. I explained to the patient that this is a 21 gene panel to evaluate patient tumors DNA to calculate recurrence score. This would help determine whether patient has high risk or low risk breast cancer. She understands that if her tumor was found to be high risk, she would benefit from systemic chemotherapy. If low risk, no need of chemotherapy. ? 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 ?Return to clinic after surgery to discuss final pathology report and then determine if Oncotype DX testing will need to be sent. ? ? ? ?All questions were answered. The  patient knows to call the clinic with any problems, questions or concerns. ?  ? Harriette Ohara, MD ?07/10/21 ? ?

## 2021-07-10 NOTE — Assessment & Plan Note (Signed)
06/27/2021: Screening mammogram detected left breast asymmetry and distortion.  No ultrasound correlate.  Axilla negative, stereotactic biopsy showed grade 1 invasive lobular cancer with LCIS, negative for lymphovascular invasion, ER 90%, PR 80%, Ki-67 4%, HER2 equivocal by IHC, FISH negative, ratio 1.21 ? ?Pathology and radiology counseling:Discussed with the patient, the details of pathology including the type of breast cancer,the clinical staging, the significance of ER, PR and HER-2/neu receptors and the implications for treatment. After reviewing the pathology in detail, we proceeded to discuss the different treatment options between surgery, radiation, chemotherapy, antiestrogen therapies. ? ?Recommendations: ?1. Breast conserving surgery followed by ?2. Oncotype DX testing to determine if chemotherapy (depending on the final tumor size and grade) ?3. Adjuvant radiation therapy followed by ?4. Adjuvant antiestrogen therapy ? ?Oncotype counseling: I discussed Oncotype DX test. I explained to the patient that this is a 21 gene panel to evaluate patient tumors DNA to calculate recurrence score. This would help determine whether patient has high risk or low risk breast cancer. She understands that if her tumor was found to be high risk, she would benefit from systemic chemotherapy. If low risk, no need of chemotherapy. ? ?Return to clinic after surgery to discuss final pathology report and then determine if Oncotype DX testing will need to be sent. ? ? ?

## 2021-07-11 ENCOUNTER — Telehealth: Payer: Self-pay | Admitting: *Deleted

## 2021-07-11 ENCOUNTER — Encounter: Payer: Self-pay | Admitting: *Deleted

## 2021-07-11 DIAGNOSIS — C50412 Malignant neoplasm of upper-outer quadrant of left female breast: Secondary | ICD-10-CM

## 2021-07-11 NOTE — Telephone Encounter (Signed)
Spoke to pt, provided navigation resources and contact information. Denies questions or concerns regarding dx or treatment care plan. Confirmed future appts. Encourage pt to call with needs. Received verbal understanding. ?

## 2021-07-14 ENCOUNTER — Inpatient Hospital Stay: Payer: 59

## 2021-07-14 ENCOUNTER — Inpatient Hospital Stay (HOSPITAL_BASED_OUTPATIENT_CLINIC_OR_DEPARTMENT_OTHER): Payer: 59 | Admitting: Licensed Clinical Social Worker

## 2021-07-14 ENCOUNTER — Other Ambulatory Visit: Payer: Self-pay

## 2021-07-14 ENCOUNTER — Other Ambulatory Visit: Payer: Self-pay | Admitting: Licensed Clinical Social Worker

## 2021-07-14 DIAGNOSIS — Z8052 Family history of malignant neoplasm of bladder: Secondary | ICD-10-CM

## 2021-07-14 DIAGNOSIS — Z8051 Family history of malignant neoplasm of kidney: Secondary | ICD-10-CM | POA: Diagnosis not present

## 2021-07-14 DIAGNOSIS — Z170 Estrogen receptor positive status [ER+]: Secondary | ICD-10-CM

## 2021-07-14 DIAGNOSIS — Z801 Family history of malignant neoplasm of trachea, bronchus and lung: Secondary | ICD-10-CM

## 2021-07-14 DIAGNOSIS — Z800 Family history of malignant neoplasm of digestive organs: Secondary | ICD-10-CM | POA: Diagnosis not present

## 2021-07-14 DIAGNOSIS — C50412 Malignant neoplasm of upper-outer quadrant of left female breast: Secondary | ICD-10-CM

## 2021-07-14 LAB — GENETIC SCREENING ORDER

## 2021-07-14 NOTE — Progress Notes (Signed)
REFERRING PROVIDER: ?Elizabeth Bookbinder, MD ?[address] ?[address] ?[place],  [place] [address] ? ?PRIMARY PROVIDER:  ?Elizabeth Neer, MD ? ?PRIMARY REASON FOR VISIT:  ?1. Malignant neoplasm of upper-outer quadrant of left breast in female, estrogen receptor positive ([place])   ?2. Family history of gastric cancer   ?3. Family history of lung cancer   ? ? ? ?HISTORY OF PRESENT ILLNESS:   ?[name], a 46 y.o. female, was seen for a [facility] cancer genetics consultation at the request of Dr. Donne Cummings, due to a personal and family history of cancer.  [name] presents to clinic today to discuss the possibility of a hereditary predisposition to cancer, genetic testing, and to further clarify her future cancer risks, as well as potential cancer risks for family members.  ? ?In 2023, at the age of 65, [name] was diagnosed with invasive lobular carcinoma of the left breast, ER/PR+ HER2-. The treatment plan includes surgery, Oncotype, adjuvant radiation and adjuvant antiestrogen therapy.  ? ? ?CANCER HISTORY:  ?Oncology History  ?Malignant neoplasm of upper-outer quadrant of left breast in female, estrogen receptor positive ([place])  ?06/27/2021 Initial Diagnosis  ? Screening mammogram detected left breast asymmetry and distortion.  No ultrasound correlate.  Axilla negative, stereotactic biopsy showed grade 1 invasive lobular cancer with LCIS, negative for lymphovascular invasion, ER 90%, PR 80%, Ki-67 4%, HER2 equivocal by IHC, FISH negative, ratio 1.21 ?  ?07/10/2021 Cancer Staging  ? Staging form: Breast, AJCC 8th Edition ?- Clinical: Stage Unknown (cTX, cN0, cM0, G1, ER+, PR+, HER2-) - Signed by Elizabeth Lose, MD on 07/10/2021 ?Stage prefix: Initial diagnosis ?Histologic grading system: 3 grade system ? ?  ? ? ? ?RISK FACTORS:  ?Menarche was at age 50.  ?First live birth at age 70.  ?OCP use for approximately  31  years.  ?Ovaries intact: yes.  ?Hysterectomy: no.  ?Menopausal status: premenopausal.  ?HRT use: 0  years. ?Colonoscopy: yes; normal. ?Mammogram within the last year: yes. ?Number of breast biopsies: 2. ?Up to date with pelvic exams: yes. ? ?Past Medical History:  ?Diagnosis Date  ? Allergy   ? seasonal, dogs, cats  ? Angioneurotic edema 05/08/2014  ? Leukocytosis 05/08/2014  ? Microcytic anemia 05/08/2014  ? Thrombocytosis 05/08/2014  ? ? ?Past Surgical History:  ?Procedure Laterality Date  ? APPENDECTOMY    ? CESAREAN SECTION    ? CHOLECYSTECTOMY    ? WRIST SURGERY    ? ? ?Social History  ? ?Socioeconomic History  ? Marital status: Married  ?  Spouse name: Not on file  ? Number of children: Not on file  ? Years of education: Not on file  ? Highest education level: Not on file  ?Occupational History  ? Occupation: Therapist, sports  ?Tobacco Use  ? Smoking status: Never  ? Smokeless tobacco: Never  ?Substance and Sexual Activity  ? Alcohol use: No  ?  Alcohol/week: 0.0 standard drinks  ? Drug use: No  ? Sexual activity: Not on file  ?Other Topics Concern  ? Not on file  ?Social History Narrative  ? Not on file  ? ?Social Determinants of Health  ? ?Financial Resource Strain: Not on file  ?Food Insecurity: Not on file  ?Transportation Needs: Not on file  ?Physical Activity: Not on file  ?Stress: Not on file  ?Social Connections: Not on file  ?  ? ?FAMILY HISTORY:  ?We obtained a detailed, 4-generation family history.  Significant diagnoses are listed below: ?Family History  ?Problem Relation  Age of Onset  ? Diabetes Mother   ? High blood pressure Mother   ? High Cholesterol Mother   ? Stroke Mother   ? Obesity Mother   ? High blood pressure Father   ? High Cholesterol Father   ? Alcoholism Father   ? Obesity Father   ? Hypertension Other   ? Hyperlipidemia Other   ? Cancer Other   ? Stroke Other   ? Diabetes Other   ? ?[name] has 1 son, 14 and 1 daughter, 37. She has 1 brother. ? ?[name]'s mother is living at 84 and has history of skin cancer (basal cell) on her face. Patient had 5 maternal aunts, 4 uncles. One aunt had colon  cancer diagnosed over the age of 78. Another aunt had a brain tumor, unsure if cancer. Maternal uncle had bladder cancer in his 92s. No known cancers in cousins. Maternal grandfather died at 72 and had a sister who died of pancreatic cancer. Maternal grandmother died over age 23. ? ?[name]'s father is living at 94. Patient has 2 paternal uncles, no cancers. Paternal grandmother had kidney cancer. Grandfather had lung cancer.  ? ?[name] is unaware of previous family history of genetic testing for hereditary cancer risks. Patient's maternal ancestors are of unk descent, and paternal ancestors are of unk descent. There is no reported Ashkenazi Jewish ancestry. There is no known consanguinity. ? ? ?GENETIC COUNSELING ASSESSMENT: [name] is a 46 y.o. female with a personal and family history of cancer which is somewhat suggestive of a hereditary cancer syndrome and predisposition to cancer. We, therefore, discussed and recommended the following at today's visit.  ? ?DISCUSSION: We discussed that approximately 10% of breast cancer is hereditary. Most cases of hereditary breast cancer are associated with BRCA1/BRCA2 genes, although there are other genes associated with hereditary cancer as well. Cancers and risks are gene specific. We discussed that testing is beneficial for several reasons including knowing about cancer risks, identifying potential screening and risk-reduction options that may be appropriate, and to understand if other family members could be at risk for cancer and allow them to undergo genetic testing.  ? ?We reviewed the characteristics, features and inheritance patterns of hereditary cancer syndromes. We also discussed genetic testing, including the appropriate family members to test, the process of testing, insurance coverage and turn-around-time for results. We discussed the implications of a negative, positive and/or variant of uncertain significant result. We recommended [name] pursue  genetic testing for the BR[facility] + [facility] CancerNext-Expanded+RNA gene panel.  ? ?The CancerNext-Expanded + RNAinsight gene panel offered by [employer] and includes sequencing and rearrangement analysis for the following 77 genes: IP, ALK, APC*, ATM*, AXIN2, BAP1, BARD1, BLM, BMPR1A, BRCA1*, BRCA2*, BRIP1*, CDC73, CDH1*,CDK4, CDKN1B, CDKN2A, CHEK2*, CTNNA1, DICER1, FANCC, FH, FLCN, GALNT12, KIF1B, LZTR1, MAX, MEN1, MET, MLH1*, MSH2*, MSH3, MSH6*, MUTYH*, NBN, NF1*, NF2, NTHL1, PALB2*, PHOX2B, PMS2*, POT1, PRKAR1A, PTCH1, PTEN*, RAD51C*, RAD51D*,RB1, RECQL, RET, SDHA, SDHAF2, SDHB, SDHC, SDHD, SMAD4, SMARCA4, SMARCB1, SMARCE1, STK11, SUFU, TMEM127, TP53*,TSC1, TSC2, VHL and XRCC2 (sequencing and deletion/duplication); EGFR, EGLN1, HOXB13, KIT, MITF, PDGFRA, POLD1 and POLE (sequencing only); EPCAM and GREM1 (deletion/duplication only). ? ?Based on Ms. Debbe Mounts personal and family history of cancer, she meets medical criteria for genetic testing. Despite that she meets criteria, she may still have an out of pocket cost. We discussed that if her out of pocket cost for testing is over $100, the laboratory will call and confirm whether she wants to proceed  with testing.  If the out of pocket cost of testing is less than $100 she will be billed by the genetic testing laboratory.  ? ?PLAN: After considering the risks, benefits, and limitations, [name] provided informed consent to pursue genetic testing and the blood sample was sent to [facility] for analysis of the BRCAPlus+ CancerNext-Expanded+ RNA panel. Initial results should be available within approximately 1 weeks' time, at which point they will be disclosed by telephone to Ms. Pinney, as will any additional recommendations warranted by these results. [name] will receive a summary of her genetic counseling visit and a copy of her results once available. This information will also be available in Epic.  ? ?Ms. Lance's questions were answered to her  satisfaction today. Our contact information was provided should additional questions or concerns arise. Thank you for the referral and allowing [place] to share in the care of your patient.  ? ?Faith Rogue, MS, LCGC ?G

## 2021-07-15 ENCOUNTER — Ambulatory Visit (HOSPITAL_COMMUNITY)
Admission: RE | Admit: 2021-07-15 | Discharge: 2021-07-15 | Disposition: A | Discharge status: Home / Self Care | Payer: 59 | Source: Ambulatory Visit | Attending: General Surgery | Admitting: General Surgery

## 2021-07-15 ENCOUNTER — Encounter: Payer: Self-pay | Admitting: Licensed Clinical Social Worker

## 2021-07-15 DIAGNOSIS — C50412 Malignant neoplasm of upper-outer quadrant of left female breast: Secondary | ICD-10-CM | POA: Diagnosis not present

## 2021-07-15 DIAGNOSIS — Z170 Estrogen receptor positive status [ER+]: Secondary | ICD-10-CM | POA: Insufficient documentation

## 2021-07-15 DIAGNOSIS — C50912 Malignant neoplasm of unspecified site of left female breast: Secondary | ICD-10-CM | POA: Diagnosis not present

## 2021-07-15 IMAGING — MR MR BREAST BILAT WO/W CM
6 of 10 series · 28 of 48 positions shown · IV contrast (9 GADAVIST)
Comparison: Previous mammogram, ultrasound and biopsy examinations.

CLINICAL DATA: Recently diagnosed left breast invasive lobular
carcinoma and lobular carcinoma in site 2 marked with an X shaped
biopsy marker clip in the outer left breast.

EXAM:
BILATERAL BREAST MRI WITH AND WITHOUT CONTRAST
TECHNIQUE: Multiplanar, multisequence MR images of both breasts were obtained
prior to and following the intravenous administration of 9 ml of
Gadavist

[Series 2: T2 · axial · 3.0mm · 0.93mm/px · 1 of 55 slices shown]
[im 1/55]
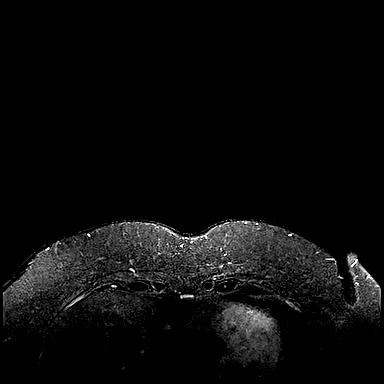

[Series 3: T1 fat-sat · axial · 1.2mm · 0.80mm/px · z∈[-20,+151]mm · 5 of 144 slices shown (1 of 4)]
[im 1/144]
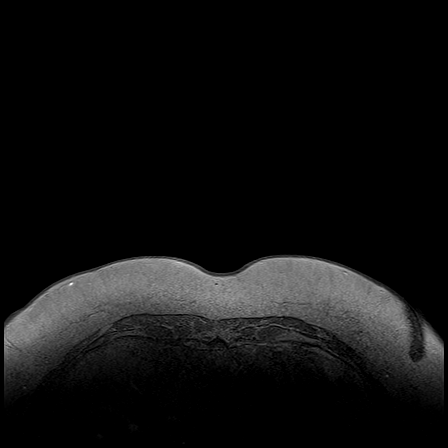
[im 36/144]
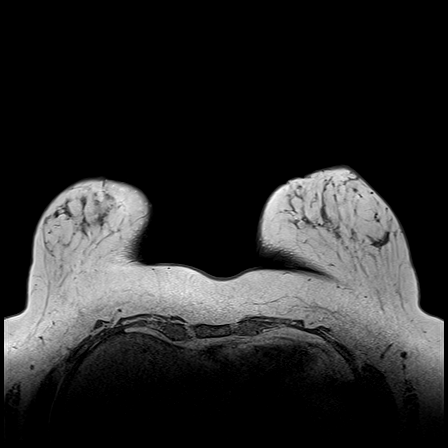
[im 72/144]
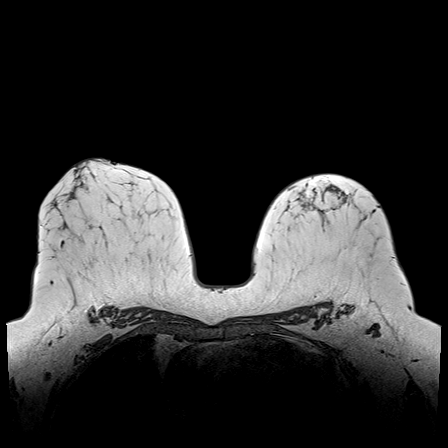
[im 108/144]
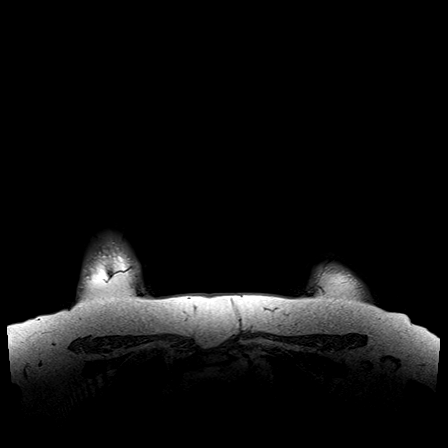
[im 144/144]
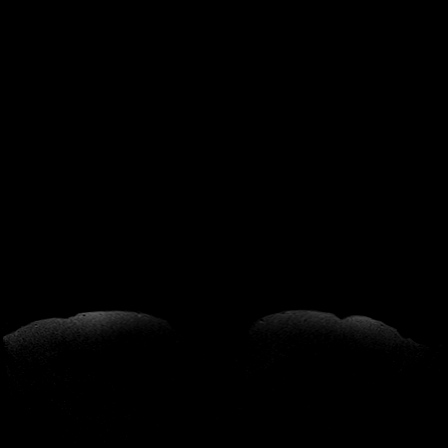

[Series 5: T1 fat-sat · axial · 1.2mm · 0.87mm/px · z∈[-20,+151]mm · 5 of 144 slices shown (2 of 4)]
[im 1/144]
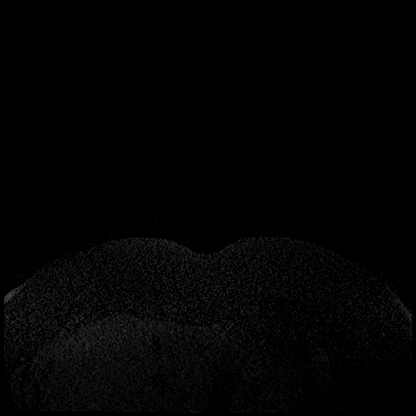
[im 36/144]
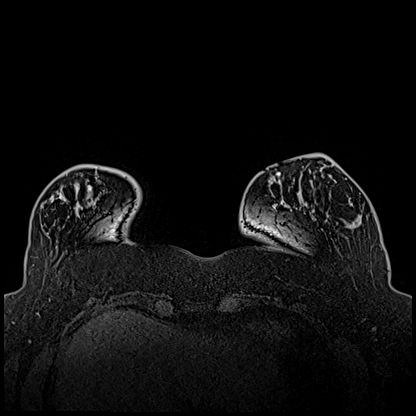
[im 72/144]
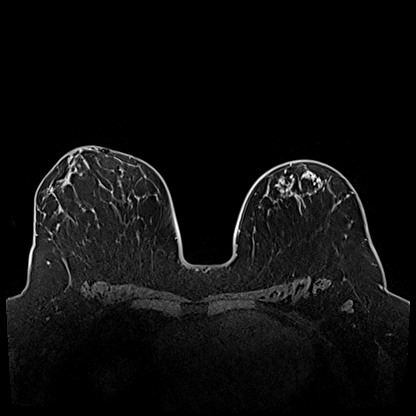
[im 108/144]
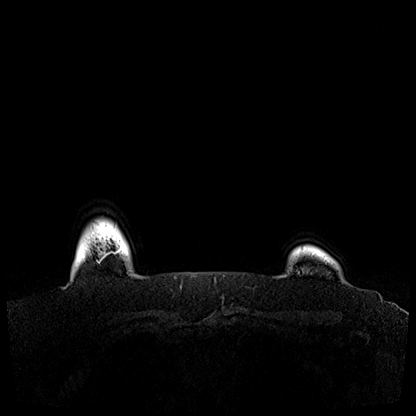
[im 144/144]
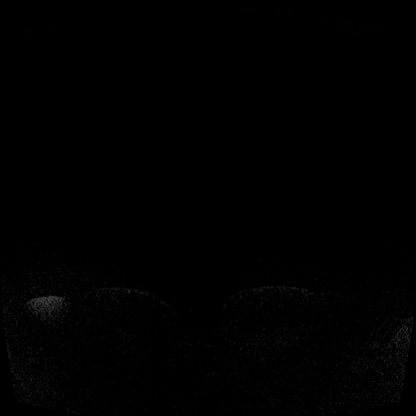

[Series 6: T1 fat-sat · axial · 1.2mm · 0.87mm/px · z∈[-20,+151]mm · 6 of 144 slices shown (3 of 4)]
[im 1/144]
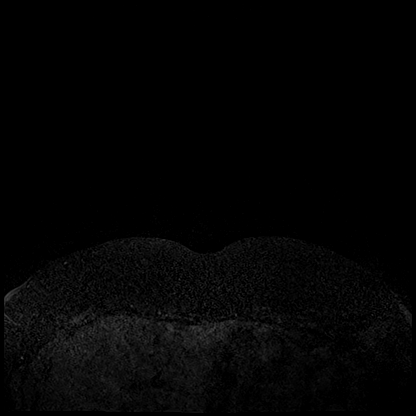
[im 29/144]
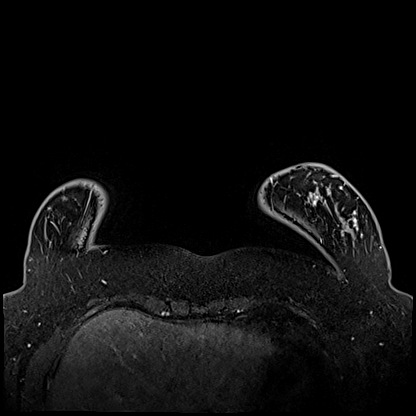
[im 58/144]
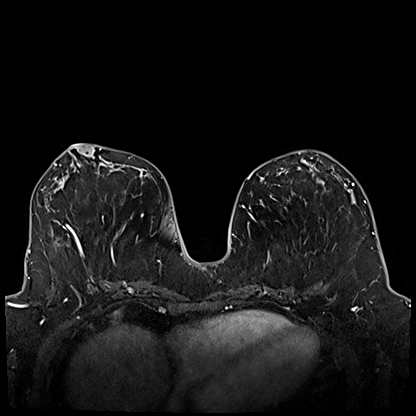
[im 86/144]
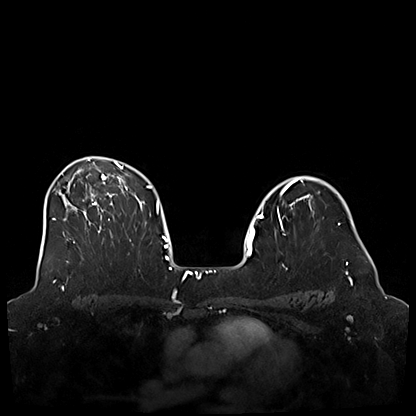
[im 115/144]
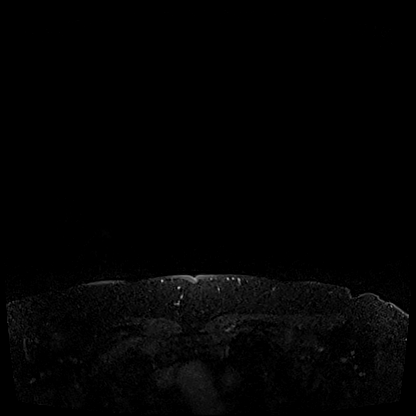
[im 144/144]
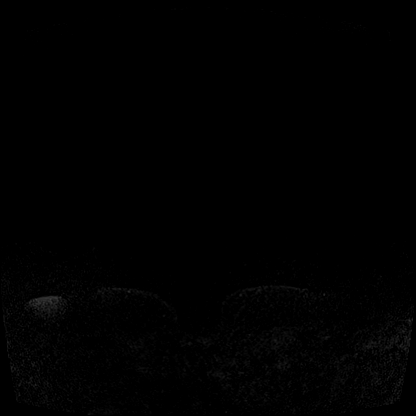

[Series 7: T1 · axial · 1.2mm · 0.87mm/px · z∈[-20,+151]mm · 6 of 144 slices shown]
[im 1/144]
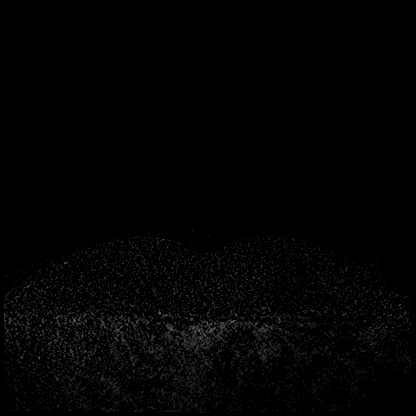
[im 29/144]
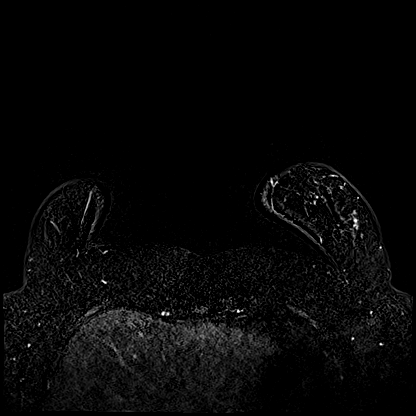
[im 58/144]
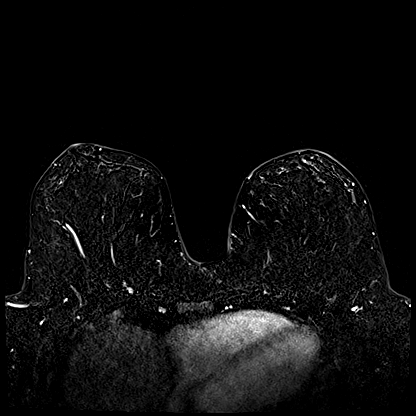
[im 86/144]
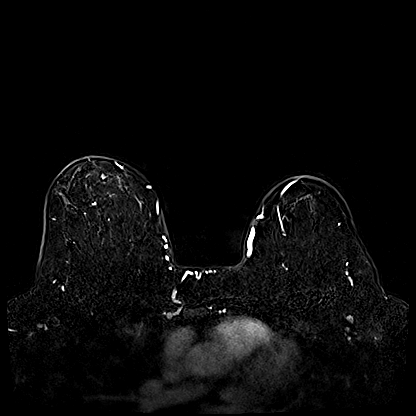
[im 115/144]
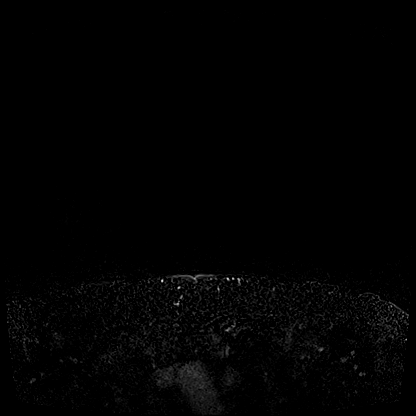
[im 144/144]
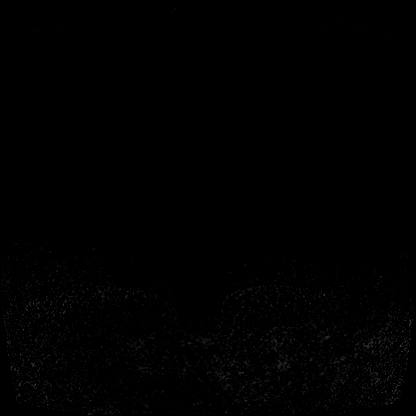

[Series 10: T1 fat-sat · axial · 1.2mm · 0.87mm/px · z∈[-20,+116]mm · 5 of 144 slices shown (4 of 4)]
[im 1/144]
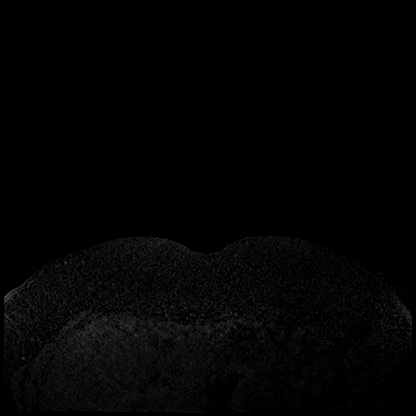
[im 29/144]
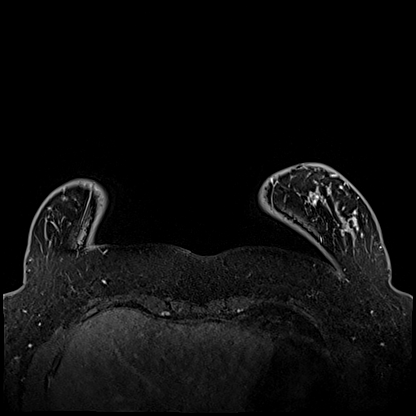
[im 58/144]
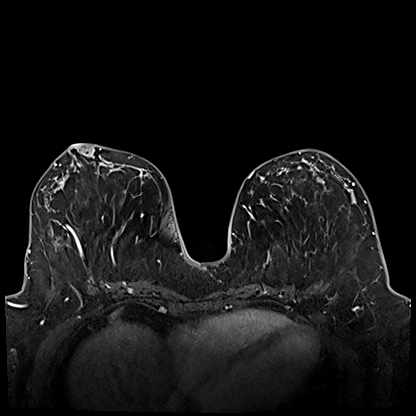
[im 86/144]
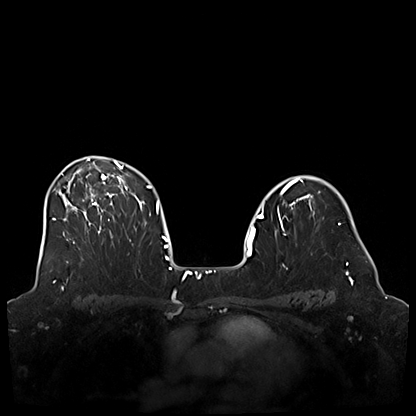
[im 115/144]
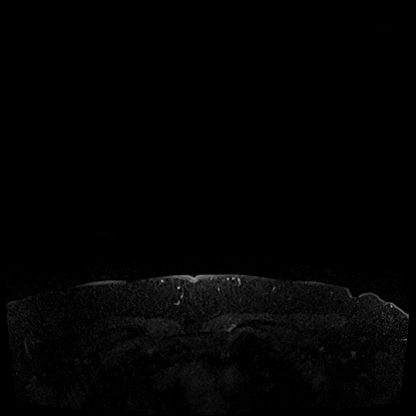

[28 of 48 positions shown; findings below may reference images not displayed]

Three-dimensional MR images were rendered by post-processing of the
original MR data on an independent workstation. The
three-dimensional MR images were interpreted, and findings are
reported in the following complete MRI report for this study. Three
dimensional images were evaluated at the independent interpreting
workstation using the DynaCAD thin client.
FINDINGS: Breast composition: b. Scattered fibroglandular tissue.

Background parenchymal enhancement: Mild

Right breast: No mass or abnormal enhancement.

Left breast: Irregular mass and non mass enhancement in the lower
outer left breast measuring 1.8 x 1.3 cm in the transverse plane and
2.7 cm in cephalocaudal dimension. This has a mixture of enhancement
kinetics with predominantly persistent and plateau kinetics and a
small amount of washout kinetics. This contains a small biopsy clip
artifact posteriorly.

6 mm more anteriorly, there is a 0.7 x 0.6 cm similar-appearing area
measuring 1.2 cm in cephalocaudal dimension. Together, these span an
area measuring 3.0 cm in AP dimension.

Lymph nodes: There is a mildly prominent medial left axillary lymph
node with prominent lobulations and a cortical thickness of 5 mm. No
abnormal left axillary lymph nodes were seen at recent ultrasound.
There is a somewhat smaller, similar-appearing lymph node in the
right axillary region at the same level with a similar degree of
cortical thickening.

Ancillary findings:  None.
IMPRESSION: 1. 2.7 cm biopsy-proven invasive lobular carcinoma and lobular
carcinoma in situ in the lower outer left breast, containing a
biopsy marker clip posteriorly.
2. 1.2 cm similar-appearing satellite lesion 6 mm more anteriorly.
Together, these span 3.0 cm with the anterior margin of the smaller
area located 3.0 cm anterior to the clip.
3. Mildly prominent lymph node in each axilla, larger on the left
with a similar degree of cortical thickening bilaterally. No
abnormal nodes were seen in the left axilla at ultrasound 1 month
ago.
4. No evidence of malignancy on the right.

RECOMMENDATION:
Treatment plan.

BI-RADS CATEGORY  6: Known biopsy-proven malignancy.

## 2021-07-15 MED ORDER — GADOBUTROL 1 MMOL/ML IV SOLN
9.0000 mL | Freq: Once | INTRAVENOUS | Status: AC | PRN
  Administered 2021-07-15: 9 mL via INTRAVENOUS

## 2021-07-15 NOTE — Progress Notes (Signed)
[facility] Clinical Social Work  ?Initial Assessment ? ? ?Elizabeth Cummings is a 46 y.o. year old female contacted by phone. Clinical Social Work was referred by  new pt protocol  for assessment of psychosocial needs.  ? ?SDOH (Social Determinants of Health) assessments performed: Yes ?SDOH Interventions   ? ?Flowsheet Row Most Recent Value  ?SDOH Interventions   ?Food Insecurity Interventions Intervention Not Indicated  ?Financial Strain Interventions Intervention Not Indicated  ?Housing Interventions Intervention Not Indicated  ?Transportation Interventions Intervention Not Indicated  ? ?  ?  ?Distress Screen completed: No ?   ? View : No data to display.  ?  ?  ?  ? ? ? ? ?Family/Social Information:  ?Housing Arrangement: patient lives with husband . Has a 32 yo son and 70 yo daughter ?Family members/support persons in your life? Family and Friends/Colleagues ?Transportation concerns: no  ?Employment: Working full time for [employer] as Therapist, sports. Income source: Employment ?Financial concerns: No ?Type of concern: None ?Food access concerns: no ?Religious or spiritual practice: not addressed ?Services Currently in place:  n/a ? ?Coping/ Adjustment to diagnosis: ?Patient understands treatment plan and what happens next? yes ?Concerns about diagnosis and/or treatment: I'm not especially worried about anything ?Patient reported stressors:  shocked at first but adjusting ?Current coping skills/ strengths: Average or above average intelligence , Capable of independent living , Communication skills , Motivation for treatment/growth , Supportive family/friends , and Work skills  ? ? ? SUMMARY: ?Current SDOH Barriers:  ?None at this time ? ?Clinical Social Work Clinical Goal(s):  ?None at this time ? ?Interventions: ?Discussed common feeling and emotions when being diagnosed with cancer, and the importance of support during treatment ?Informed patient of the support team roles and support services at [facility] ?Provided CSW contact information  and encouraged patient to call with any questions or concerns ? ? ?Follow Up Plan: Patient will contact CSW with any support or resource needs ?Patient verbalizes understanding of plan: Yes ? ? ? ?Elizabeth Litke E Aragon Scarantino, LCSW ?

## 2021-07-16 ENCOUNTER — Encounter: Payer: Self-pay | Admitting: *Deleted

## 2021-07-17 ENCOUNTER — Ambulatory Visit: Payer: Self-pay

## 2021-07-17 NOTE — Progress Notes (Signed)
?Radiation Oncology         [phone] ?________________________________ ? ?Initial Outpatient Consultation ? ?Name: Elizabeth Cummings MRN: [id number]  ?Date: 07/18/2021  DOB: 02-11-1976 ? ?MB:WGYK, Nathen May, MD  Rolm Bookbinder, MD  ? ?REFERRING PHYSICIAN: Rolm Bookbinder, MD ? ?DIAGNOSIS:  ?  ICD-10-CM   ?1. Malignant neoplasm of upper-outer quadrant of left breast in female, estrogen receptor positive ([place])  C50.412   ? Z17.0   ?  ? ? ?Left Breast UOQ, Invasive Lobular Carcinoma with LCIS, ER+ / PR+ / Her2-, Grade 1 ? ? Cancer Staging  ?Malignant neoplasm of upper-outer quadrant of left breast in female, estrogen receptor positive ([place]) ?Staging form: Breast, AJCC 8th Edition ?- Clinical: Stage Unknown (cTX, cN0, cM0, G1, ER+, PR+, HER2-) - Signed by Nicholas Lose, MD on 07/10/2021 ? ? ?CHIEF COMPLAINT: Here to discuss management of left breast cancer ? ?HISTORY OF PRESENT ILLNESS::Elizabeth Cummings is a 46 y.o. female who presented with a left breast abnormality on the following imaging: bilateral screening mammogram on the date of 05/14/21.  No symptoms, if any, were reported at that time. Diagnostic left breast mammogram and ultrasound on 06/10/21 further revealed an indeterminate asymmetry with subtle distortion in the outer left breast. No findings of left axillary adenopathy, or evidence of a discrete cyst or mass were appreciated. (The patient was also noted to have b-density breasts on both screening and diagnostic imaging).  ? ?Biopsy of the lateral left breast on date of 06/27/21 showed grade 1 invasive lobular carcinoma measuring 4 mm in the greatest linear extent of the sample, with lobular carcinoma in-situ (and no evidence of LVI).  ER status: 90% positive with strong staining intensity; PR status 80% positive with moderate/strong staining intensity; Proliferation marker Ki67 at 4%; Her2 status negative; Grade 1. No  lymph nodes were examined.  ? ?Accordingly, the patient was referred to Dr.  Donne Hazel on 07/08/21 for further evaluation. Given her lobular histology, Dr. Donne Hazel recommended further work-up via MRI prior to making a decision regarding surgery (likely leaning towards breast conserving surgery w/ SLN biopsies). Dr. Donne Hazel also placed referrals to OP rehab and genetics given her extensive family history of cancer (detailed below). ? ?Subsequent bilateral breast MRI with and without contrast on 07/15/21 revealed the 2.7 cm biopsy-proven invasive lobular carcinoma and LCIS in the lower outer left breast, along with a 1.2 cm similar-appearing satellite lesion, located 6 mm more anteriorly to the biopsy proven carcinoma (and together spanning 3.0 cm collectively). MRI also showed a mildly prominent lymph node in each axilla, larger on the left, with a similar degree of cortical thickening bilaterally (no abnormal nodes were seen on prior US or mammogram). Otherwise, MRI showed no evidence of malignancy in the right breast.  ? ?The patient was then referred to Dr. Lindi Adie on 07/10/21 to discuss adjuvant treatment options. Following a detailed discussion, Dr. Lindi Adie recommended Oncotype testing (following her tentative breast conserving surgery), followed by XRT and adjuvant antiestrogen therapy. Following breast conserving surgery, the patient will return to Dr. Lindi Adie to discuss the need for chemotherapy based on her Oncotype results.  ? ?Of note: the patient works as an Therapist, sports at [employer]  ? ? ?Lymphedema issues, if any:  Patient denies   ? ?Pain issues, if any:  Reports occasional twinges, but states they resolve quickly on their own  ? ?SAFETY ISSUES: ?Prior radiation? No ?Pacemaker/ICD? No ?Possible current pregnancy? No--IUD recently placed ?Is the patient on methotrexate? No ? ?Current Complaints / other  details:  Nothing else of note ?   ? ? ?PREVIOUS RADIATION THERAPY: No ? ?PAST MEDICAL HISTORY:  has a past medical history of Allergy, Angioneurotic edema (05/08/2014), Leukocytosis  (05/08/2014), Microcytic anemia (05/08/2014), and Thrombocytosis (05/08/2014).   ? ?PAST SURGICAL HISTORY: ?Past Surgical History:  ?Procedure Laterality Date  ? APPENDECTOMY    ? CESAREAN SECTION    ? CHOLECYSTECTOMY    ? WRIST SURGERY    ? ? ?FAMILY HISTORY: family history includes Alcoholism in her father; Bladder Cancer in her maternal uncle; Cancer in an other family member; Colon cancer in her maternal aunt; Diabetes in her mother and another family member; High Cholesterol in her father and mother; High blood pressure in her father and mother; Hyperlipidemia in an other family member; Hypertension in an other family member; Kidney cancer in her paternal grandmother; Lung cancer in her paternal grandfather; Obesity in her father and mother; Other in her maternal aunt; Pancreatic cancer in an other family member; Stroke in her mother and another family member. ? ?SOCIAL HISTORY:  reports that she has never smoked. She has never used smokeless tobacco. She reports that she does not drink alcohol and does not use drugs. ? ?ALLERGIES: Dilaudid [hydromorphone hcl] and Gluten meal ? ?MEDICATIONS:  ?Current Outpatient Medications  ?Medication Sig Dispense Refill  ? Probiotic Product (DIGESTIVE ADV DIGESTIVE/IMMUNE PO) Take 1 Units by mouth daily.    ? azelastine (ASTELIN) 0.1 % nasal spray Place 1-2 sprays into both nostrils 2 (two) times daily. 30 mL 5  ? calcium-vitamin D (OSCAL WITH D) 500-200 MG-UNIT per tablet Take 1 tablet by mouth daily.    ? COVID-19 At Home Antigen Test [facility] COVID-19 HOME TEST) KIT Use as directed 4 each 0  ? [employer] FIBER GUMMIES PO Take 2 Units by mouth daily.    ? fexofenadine (ALLEGRA) 180 MG tablet Take 1 tablet Orally Once a day in the morning. (please fill with levocetirizine) 90 tablet 3  ? fluticasone (FLONASE) 50 MCG/ACT nasal spray Place 2 sprays into both nostrils daily. 16 g 6  ? hydrochlorothiazide (HYDRODIURIL) 25 MG tablet Take 1 tablet (25 mg total) by mouth daily for edema.  90 tablet 3  ? levocetirizine (XYZAL) 5 MG tablet Take 1 tablet (5 mg total) by mouth every evening. 90 tablet 3  ? levonorgestrel (MIRENA, 52 MG,) 20 MCG/DAY IUD 1 Intra Uterine Device by Intrauterine route once.    ? Multiple Vitamin (MULTIVITAMIN) tablet Take 1 tablet by mouth daily.    ? norethindrone-ethinyl estradiol (DASETTA 7/7/7) 0.5/0.75/1-35 MG-MCG tablet Take 1 tablet by mouth daily. 84 tablet 4  ? pravastatin (PRAVACHOL) 20 MG tablet Take 1 tablet (20 mg total) by mouth 2 (two) times a week on Monday and thursday 90 tablet 0  ? tirzepatide (MOUNJARO) 12.5 MG/0.5ML Pen Inject 12.5 mg into the skin once a week. 2 mL 0  ? ?No current facility-administered medications for this encounter.  ? ? ?REVIEW OF SYSTEMS: As above in HPI. ?  ?PHYSICAL EXAM:  weight is 200 lb 6 oz (90.9 kg). Her blood pressure is 123/90 and her pulse is 91. Her respiration is 17 and oxygen saturation is 99%.   ?General: Alert and oriented, in no acute distress ?HEENT: Head is normocephalic. Extraocular movements are intact.   ?Heart: Regular in rate and rhythm with no murmurs, rubs, or gallops. ?Chest: Clear to auscultation bilaterally, with no rhonchi, wheezes, or rales. ?Abdomen: Soft, nontender, nondistended, with no rigidity or guarding. ?Psychiatric: Judgment and  insight are intact. Affect is appropriate. ?Breasts: no palpable masses appreciated in the breasts or axillae b/l  ? ?ECOG = 0 ? ?0 - Asymptomatic (Fully active, able to carry on all predisease activities without restriction) ? ?1 - Symptomatic but completely ambulatory (Restricted in physically strenuous activity but ambulatory and able to carry out work of a light or sedentary nature. For example, light housework, office work) ? ?2 - Symptomatic, <50% in bed during the day (Ambulatory and capable of all self care but unable to carry out any work activities. Up and about more than 50% of waking hours) ? ?3 - Symptomatic, >50% in bed, but not bedbound (Capable of only  limited self-care, confined to bed or chair 50% or more of waking hours) ? ?4 - Bedbound (Completely disabled. Cannot carry on any self-care. Totally confined to bed or chair) ? ?5 - Death ? ? Eustace Pen MM, [place] RH, T

## 2021-07-17 NOTE — Progress Notes (Signed)
Location of Breast Cancer:  ?Malignant neoplasm of upper-outer quadrant of left breast in female, estrogen receptor positive ? ?Histology per Pathology Report:  ?(Definitive pathology pending eventual surgery) ?06/27/2021 ?Diagnosis ?Breast, left, needle core biopsy, Lateral ?- INVASIVE LOBULAR CARCINOMA. ?- THE LINEAR EXTENT OF CARCINOMA IN THE LONGEST CORE: 4 MM. ?- NOTTINGHAM SCORE: 3+1+1, GRADE 1. ?- THERE IS AN ACCOMPANYING COMPONENT OF LOBULAR CARCINOMA IN SITU. ?- NEGATIVE FOR LYMPHOVASCULAR INVASION. ? ?Receptor Status: ER(90%), PR (80%), Her2-neu (Negative via FISH), Ki-67(4%) ? ?Did patient present with symptoms (if so, please note symptoms) or was this found on screening mammography?: Patient had a routine screening mammogram that detected an abnormality in the left breast which led to ultrasound ? ?Past/Anticipated interventions by surgeon, if any:  ?07/08/2021 ?--Dr. Rolm Bookbinder (office visit) ?We discussed the staging and pathophysiology of breast cancer.  ?We discussed a sentinel lymph node biopsy as she does not appear to having lymph node involvement right now.  ?We discussed the options for treatment of the breast cancer which included lumpectomy versus a mastectomy.  ?We also discussed that she will likely need radiation therapy if she undergoes lumpectomy.  ?We discussed mastectomy and the postoperative care for that as well.  ?The decision for lumpectomy vs mastectomy has no impact on decision for chemotherapy.  ?We discussed the risks of operation including bleeding, infection, possible reoperation.  ?She understands her further therapy will be based on what her stages at the time of her operation. ?No follow-ups on file. ? ?Past/Anticipated interventions by medical oncology, if any:  ?Under care of Dr. 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 ?--Return to clinic after surgery to discuss final pathology report and then determine if Oncotype DX testing will need to be sent. ? ?Lymphedema issues, if any:  Patient denies   ? ?Pain issues, if any:  Reports occasional twinges, but states they resolve quickly on their own  ? ?SAFETY ISSUES: ?Prior radiation? No ?Pacemaker/ICD? No ?Possible current pregnancy? No--IUD recently placed ?Is the patient on methotrexate? No ? ?Current Complaints / other details:  Nothing else of note ?   ? ? ? ?

## 2021-07-18 ENCOUNTER — Encounter: Payer: Self-pay | Admitting: Radiation Oncology

## 2021-07-18 ENCOUNTER — Ambulatory Visit
Admission: RE | Admit: 2021-07-18 | Discharge: 2021-07-18 | Disposition: A | Discharge status: Home / Self Care | Payer: 59 | Source: Ambulatory Visit | Attending: Radiation Oncology | Admitting: Radiation Oncology

## 2021-07-18 ENCOUNTER — Other Ambulatory Visit: Payer: Self-pay

## 2021-07-18 VITALS — BP 123/90 | HR 91 | Resp 17 | Wt 200.4 lb

## 2021-07-18 DIAGNOSIS — C50412 Malignant neoplasm of upper-outer quadrant of left female breast: Secondary | ICD-10-CM | POA: Insufficient documentation

## 2021-07-18 DIAGNOSIS — Z800 Family history of malignant neoplasm of digestive organs: Secondary | ICD-10-CM | POA: Insufficient documentation

## 2021-07-18 DIAGNOSIS — Z793 Long term (current) use of hormonal contraceptives: Secondary | ICD-10-CM | POA: Diagnosis not present

## 2021-07-18 DIAGNOSIS — Z809 Family history of malignant neoplasm, unspecified: Secondary | ICD-10-CM | POA: Diagnosis not present

## 2021-07-18 DIAGNOSIS — Z170 Estrogen receptor positive status [ER+]: Secondary | ICD-10-CM | POA: Insufficient documentation

## 2021-07-18 DIAGNOSIS — Z79899 Other long term (current) drug therapy: Secondary | ICD-10-CM | POA: Diagnosis not present

## 2021-07-18 DIAGNOSIS — Z8052 Family history of malignant neoplasm of bladder: Secondary | ICD-10-CM | POA: Diagnosis not present

## 2021-07-20 ENCOUNTER — Encounter: Payer: Self-pay | Admitting: Radiation Oncology

## 2021-07-22 ENCOUNTER — Other Ambulatory Visit: Payer: Self-pay | Admitting: General Surgery

## 2021-07-22 DIAGNOSIS — C50912 Malignant neoplasm of unspecified site of left female breast: Secondary | ICD-10-CM | POA: Diagnosis not present

## 2021-07-22 DIAGNOSIS — Z170 Estrogen receptor positive status [ER+]: Secondary | ICD-10-CM

## 2021-07-23 ENCOUNTER — Other Ambulatory Visit: Payer: Self-pay | Admitting: General Surgery

## 2021-07-23 DIAGNOSIS — C50412 Malignant neoplasm of upper-outer quadrant of left female breast: Secondary | ICD-10-CM

## 2021-07-24 ENCOUNTER — Other Ambulatory Visit: Payer: Self-pay | Admitting: General Surgery

## 2021-07-24 ENCOUNTER — Other Ambulatory Visit (HOSPITAL_COMMUNITY): Payer: Self-pay | Admitting: Diagnostic Radiology

## 2021-07-24 ENCOUNTER — Encounter (HOSPITAL_BASED_OUTPATIENT_CLINIC_OR_DEPARTMENT_OTHER): Payer: Self-pay | Admitting: General Surgery

## 2021-07-24 ENCOUNTER — Ambulatory Visit
Admission: RE | Admit: 2021-07-24 | Discharge: 2021-07-24 | Disposition: A | Discharge status: Home / Self Care | Payer: 59 | Source: Ambulatory Visit | Attending: General Surgery | Admitting: General Surgery

## 2021-07-24 ENCOUNTER — Other Ambulatory Visit: Payer: Self-pay

## 2021-07-24 DIAGNOSIS — Z170 Estrogen receptor positive status [ER+]: Secondary | ICD-10-CM

## 2021-07-24 DIAGNOSIS — C801 Malignant (primary) neoplasm, unspecified: Secondary | ICD-10-CM

## 2021-07-24 DIAGNOSIS — C50912 Malignant neoplasm of unspecified site of left female breast: Secondary | ICD-10-CM | POA: Diagnosis not present

## 2021-07-24 DIAGNOSIS — C50512 Malignant neoplasm of lower-outer quadrant of left female breast: Secondary | ICD-10-CM | POA: Diagnosis not present

## 2021-07-24 HISTORY — PX: BREAST BIOPSY: SHX20

## 2021-07-24 HISTORY — DX: Malignant (primary) neoplasm, unspecified: C80.1

## 2021-07-24 IMAGING — MR MR BREAST BX W LOC DEV 1ST LESION IMAGE BX SPEC MR GUIDE*L*
7 of 10 series · 32 of 48 positions shown · IV contrast (9 ml gadavist)
Comparison: Prior exams previous exams
COMPARISON: Prior exams previous exams

Addendum:
CLINICAL DATA: Recent biopsy-proven invasive lobular carcinoma
outer left breast. Patient here for biopsy of a 1.2 cm focus of non
mass enhancement 6 mm anterior to the biopsy-proven malignancy in
the outer left breast.

EXAM:
MRI GUIDED CORE NEEDLE BIOPSY OF THE LEFT BREAST
TECHNIQUE: Multiplanar, multisequence MR imaging of the left breast was
performed both before and after administration of intravenous
contrast.
CONTRAST:  9mL GADAVIST GADOBUTROL 1 MMOL/ML IV SOLN.

[Series 2: fiducial unilateral · sagittal · 2.0mm · 1.33mm/px · 3 of 52 slices shown]
[im 1/52]
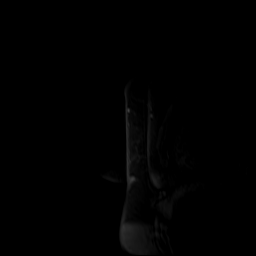
[im 26/52]
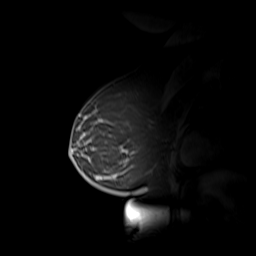
[im 52/52]
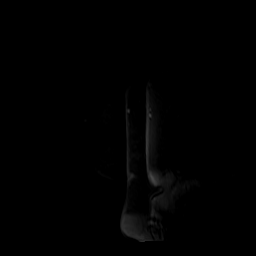

[Series 3: dynamic pre · axial · non-contrast · 1.3mm · 0.73mm/px · z∈[-96,+132]mm · 5 of 176 slices shown]
[im 1/176]
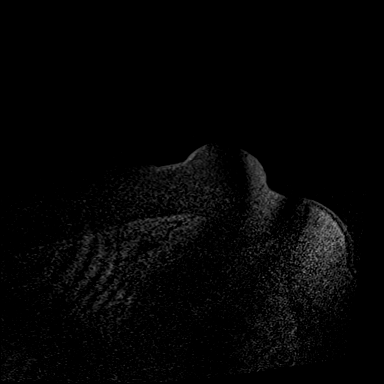
[im 44/176]
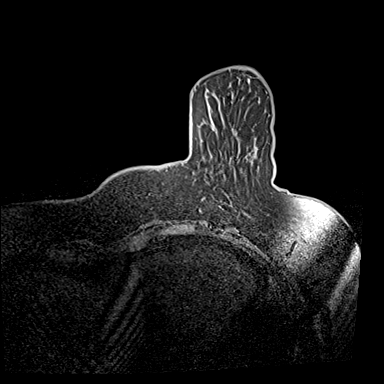
[im 88/176]
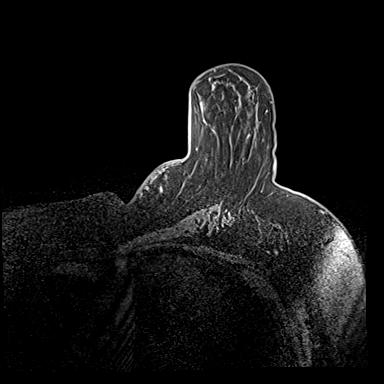
[im 132/176]
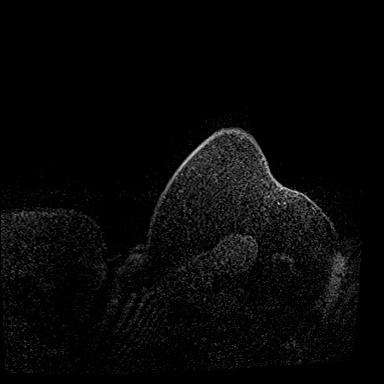
[im 176/176]
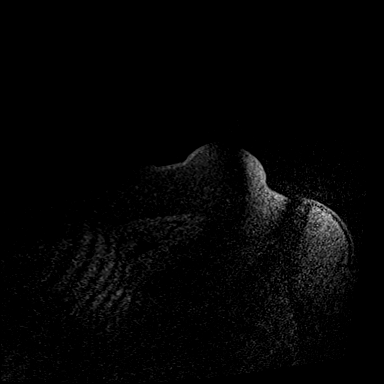

[Series 4: dynamic post 20 · axial · 1.3mm · 0.73mm/px · z∈[-96,+132]mm · 5 of 176 slices shown (1 of 2)]
[im 1/176]
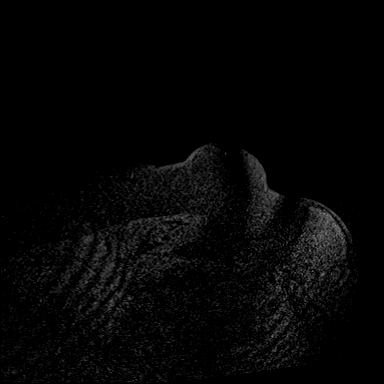
[im 44/176]
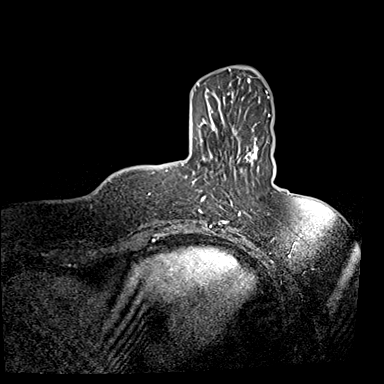
[im 88/176]
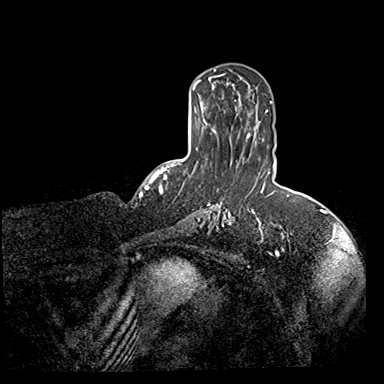
[im 132/176]
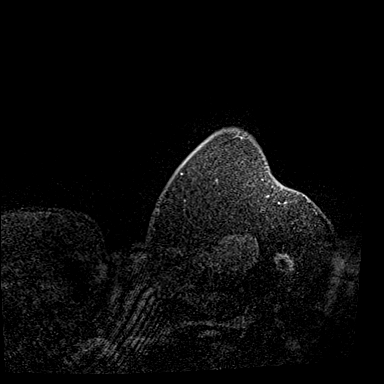
[im 176/176]
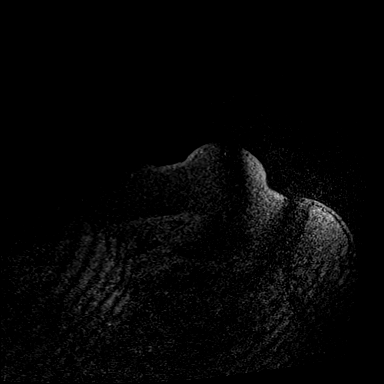

[Series 5: dynamic post 20 · axial · 1.3mm · 0.73mm/px · z∈[-96,+132]mm · 5 of 176 slices shown (2 of 2)]
[im 1/176]
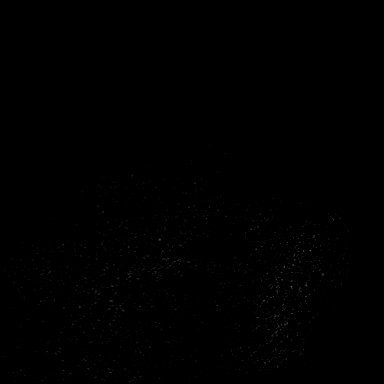
[im 44/176]
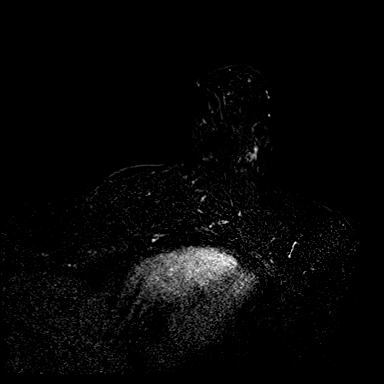
[im 88/176]
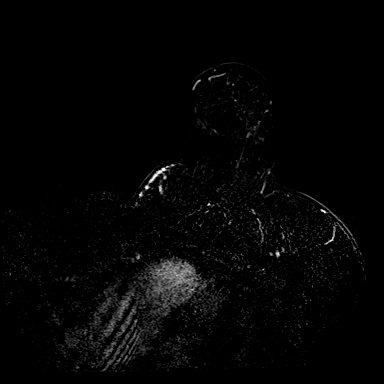
[im 132/176]
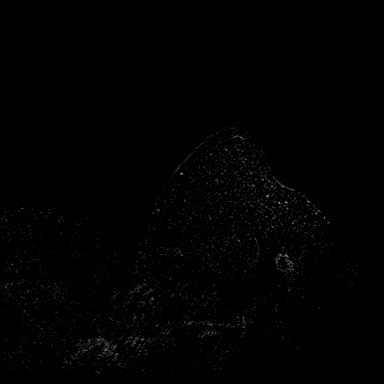
[im 176/176]
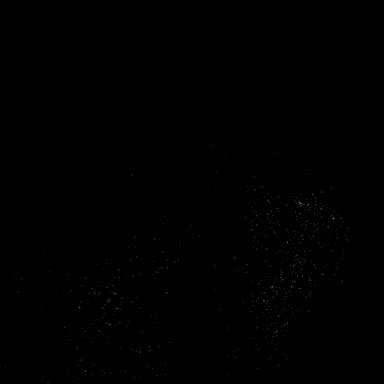

[Series 6: dynamic post 3 · axial · 1.3mm · 0.73mm/px · z∈[-96,+132]mm · 5 of 176 slices shown (1 of 2)]
[im 1/176]
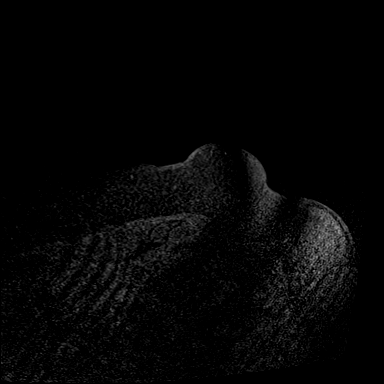
[im 44/176]
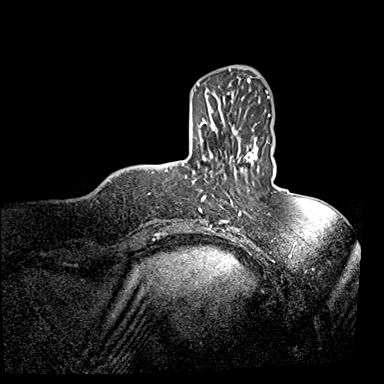
[im 88/176]
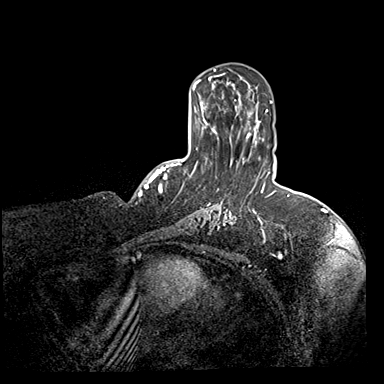
[im 132/176]
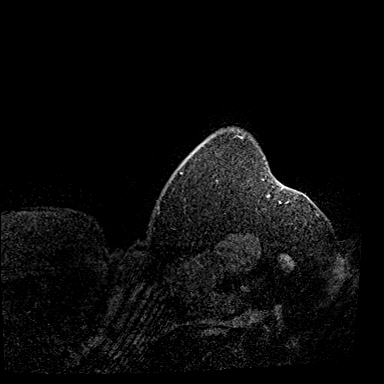
[im 176/176]
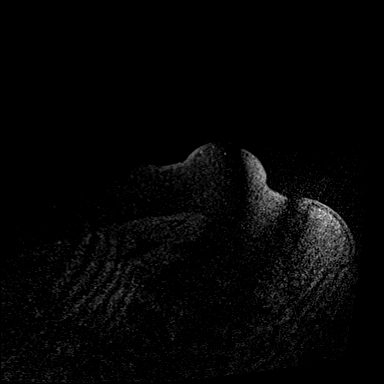

[Series 7: dynamic post 3 · axial · 1.3mm · 0.73mm/px · z∈[-96,+132]mm · 5 of 176 slices shown (2 of 2)]
[im 1/176]
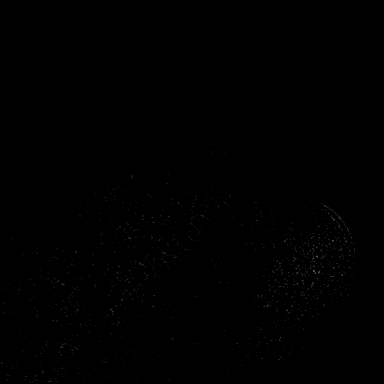
[im 44/176]
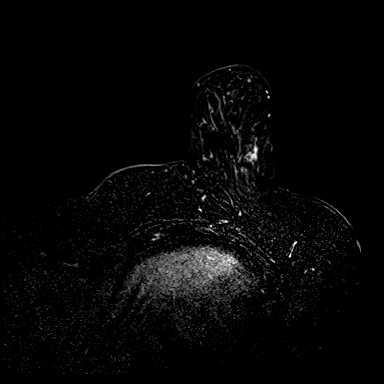
[im 88/176]
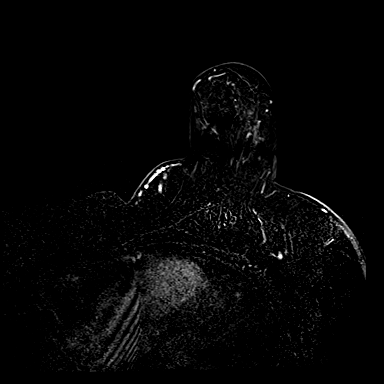
[im 132/176]
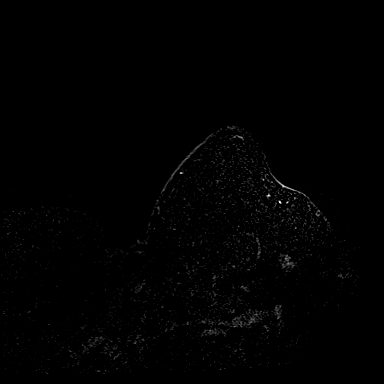
[im 176/176]
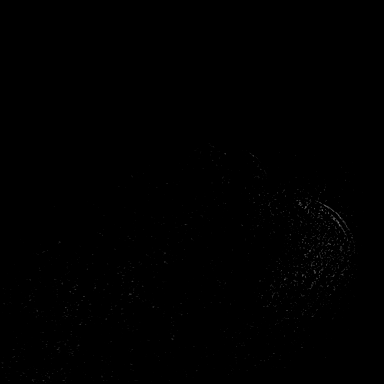

[Series 8: needle confirmation · axial · 1.3mm · 0.73mm/px · z∈[-96,+75]mm · 4 of 176 slices shown]
[im 1/176]
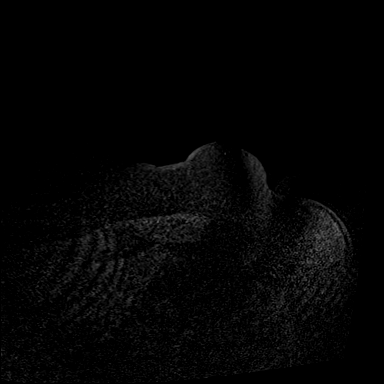
[im 44/176]
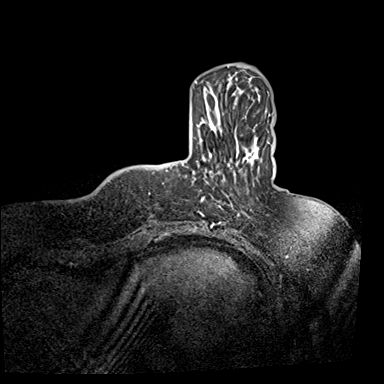
[im 88/176]
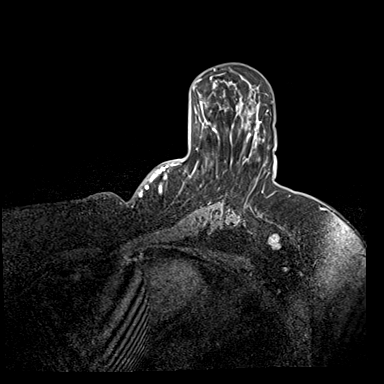
[im 132/176]
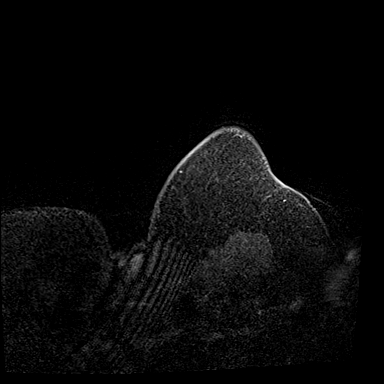

[32 of 48 positions shown; findings below may reference images not displayed]

FINDINGS: I met with the patient, and we discussed the procedure of MRI guided
biopsy, including risks, benefits, and alternatives. Specifically,
we discussed the risks of infection, bleeding, tissue injury, clip
migration, and inadequate sampling. Informed, written consent was
given. The usual time out protocol was performed immediately prior
to the procedure.

Using sterile technique, 1% Lidocaine, MRI guidance, and a 9 gauge
vacuum assisted device, biopsy was performed of the 1.2 cm focus of
non mass enhancement 6 mm anterior to patient's biopsy-proven
malignancy in the middle third of the outer left breast using a
lateral to medial approach. At the conclusion of the procedure, a
cylinder shaped tissue marker clip was deployed into the biopsy
cavity. Follow-up 2-view mammogram was performed and dictated
separately.
IMPRESSION: MRI guided biopsy of indeterminate non mass enhancement left breast.
No apparent complications.

ADDENDUM:
Pathology revealed GRADE I INVASIVE DUCTAL CARCINOMA, THERE IS A
SMALL ACCOMPANYING COMPONENT OF LOW-GRADE DCIS WITH A MINUTE FOCUS
OF COMEDONECROSIS of the LEFT breast, outer lower quadrant,
(cylinder clip). This was found to be concordant by Dr. DANY
DANY.

Pathology results were discussed with the patient by telephone. The
patient reported doing well after the biopsy with tenderness at the
site. Post biopsy instructions and care were reviewed and questions
were answered. The patient was encouraged to call The [REDACTED] for any additional concerns. My direct phone
number was provided.

The patient has a recent diagnosis of LEFT breast cancer and should
follow her outlined treatment plan.

Dr. DANY was notified of biopsy results via [REDACTED]
message on [DATE].

Pathology results reported by DANY, RN on [DATE].

*** End of Addendum ***
FINDINGS: I met with the patient, and we discussed the procedure of MRI guided
biopsy, including risks, benefits, and alternatives. Specifically,
we discussed the risks of infection, bleeding, tissue injury, clip
migration, and inadequate sampling. Informed, written consent was
given. The usual time out protocol was performed immediately prior
to the procedure.

Using sterile technique, 1% Lidocaine, MRI guidance, and a 9 gauge
vacuum assisted device, biopsy was performed of the 1.2 cm focus of
non mass enhancement 6 mm anterior to patient's biopsy-proven
malignancy in the middle third of the outer left breast using a
lateral to medial approach. At the conclusion of the procedure, a
cylinder shaped tissue marker clip was deployed into the biopsy
cavity. Follow-up 2-view mammogram was performed and dictated
separately.
IMPRESSION: MRI guided biopsy of indeterminate non mass enhancement left breast.
No apparent complications.

## 2021-07-24 IMAGING — MG MM BREAST LOCALIZATION CLIP
4 series · 4 of 12 positions shown · non-contrast
Comparison: Previous exam(s).

CLINICAL DATA: Patient is post MRI guided biopsy of focal 1.2 cm
non mass enhancement 6 mm anterior to biopsy-proven malignancy over
the outer midportion of the left breast.

EXAM:
3D DIAGNOSTIC LEFT MAMMOGRAM POST MRI BIOPSY

[L ML synth-2D]
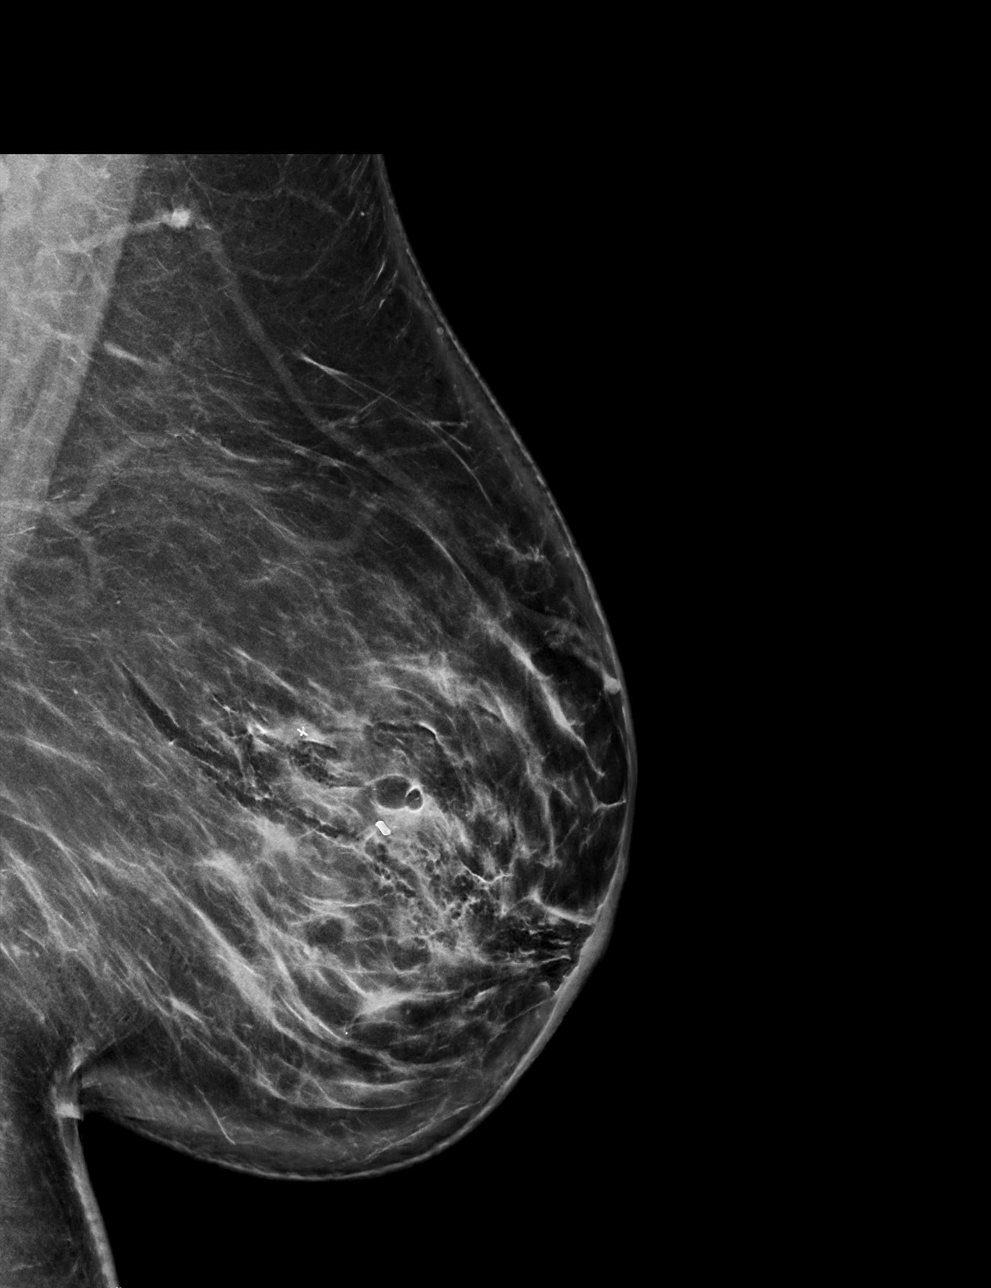

[L CC synth-2D]
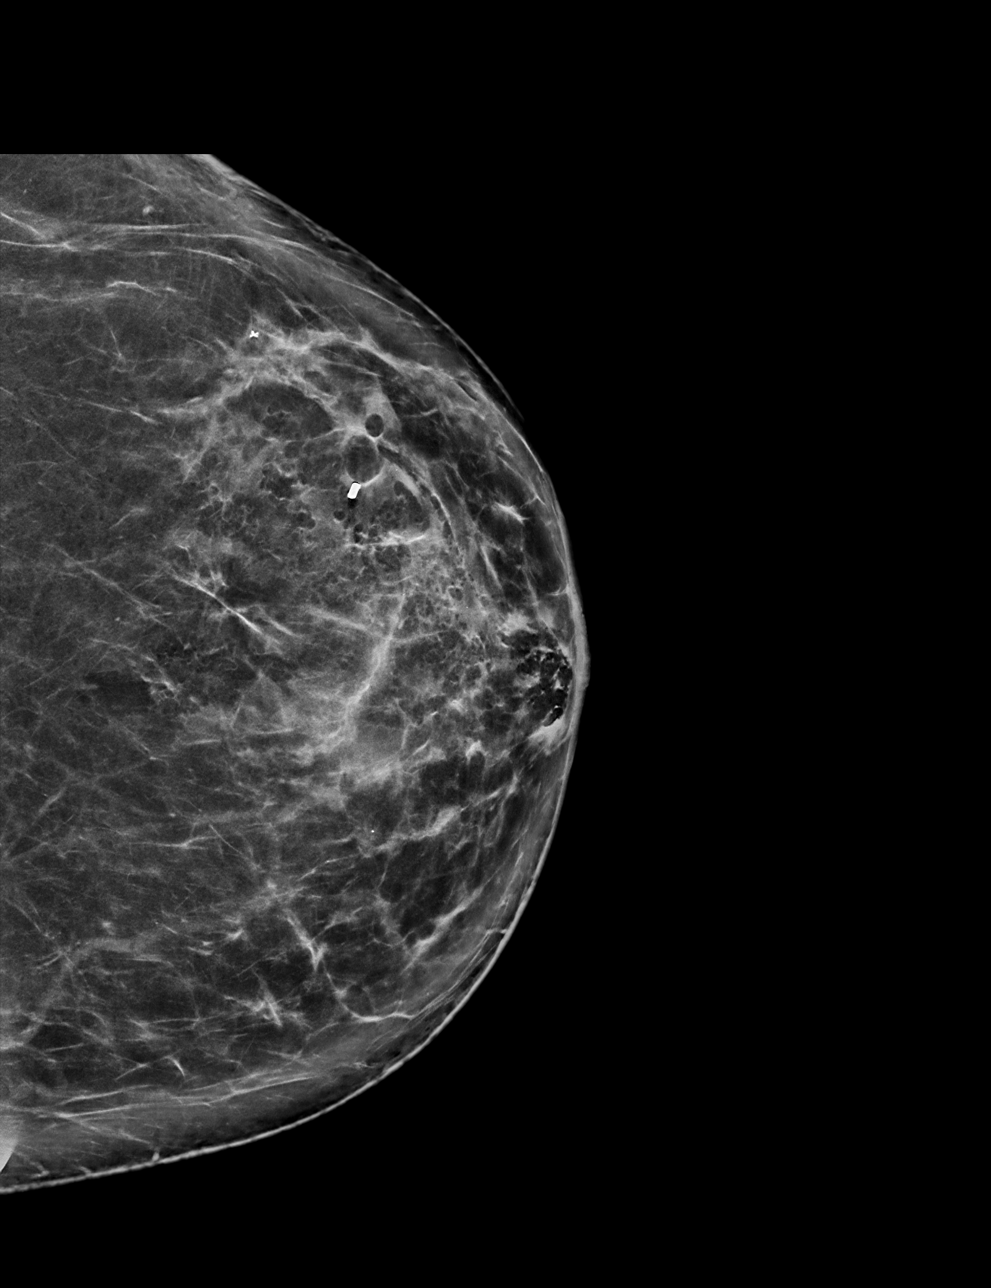

[L ML tomo · tomo slice 49/96.0]
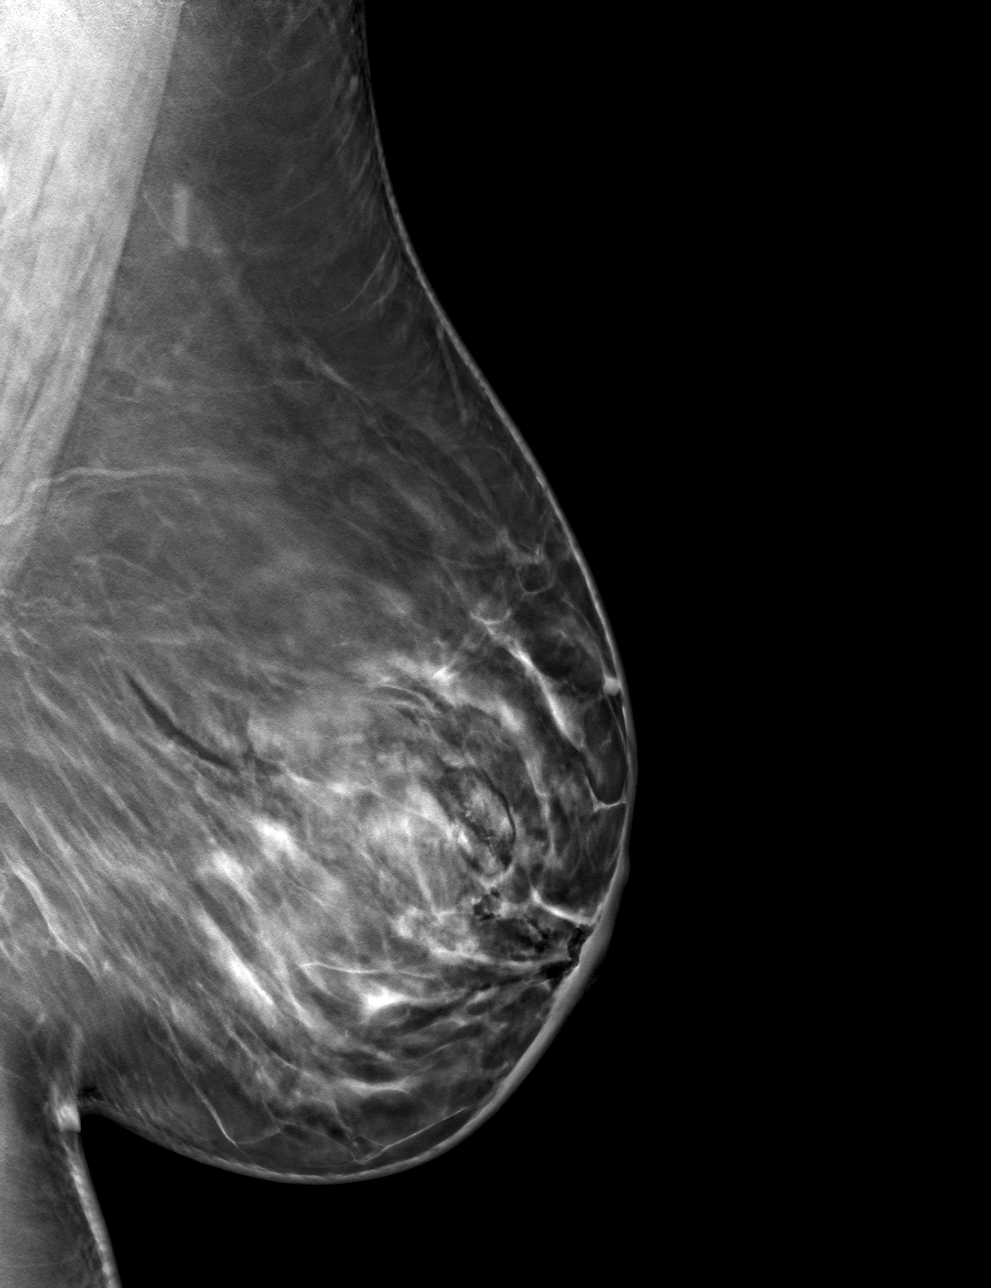

[L CC tomo · tomo slice 47/93.0]
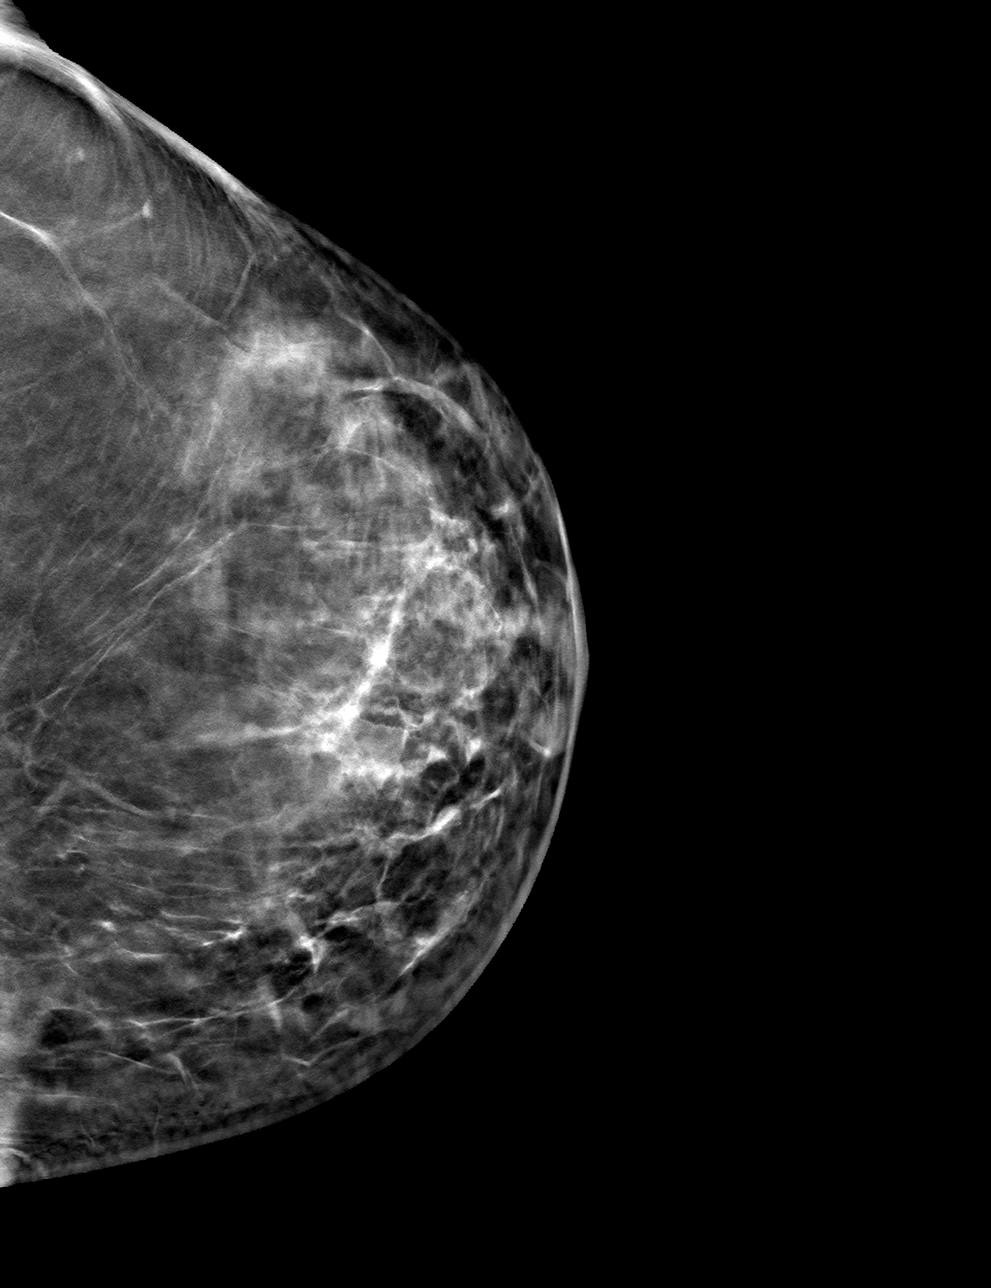

[4 of 12 positions shown; findings below may reference images not displayed]

FINDINGS: 3D Mammographic images were obtained following MRI guided biopsy of
the targeted non mass enhancement over the outer left breast. The
biopsy marking clip is lies along the inferior edge of the targeted
biopsy site on the lateral image and approximately 1 cm medial to
the biopsy site on the CC image.
IMPRESSION: Positioning of the cylinder shaped biopsy marking clip at the site
of biopsy in the outer midportion of the left breast as described.

Final Assessment: Post Procedure Mammograms for Marker Placement

## 2021-07-24 MED ORDER — GADOBUTROL 1 MMOL/ML IV SOLN
9.0000 mL | Freq: Once | INTRAVENOUS | Status: AC | PRN
  Administered 2021-07-24: 9 mL via INTRAVENOUS

## 2021-07-25 ENCOUNTER — Ambulatory Visit
Admission: RE | Admit: 2021-07-25 | Discharge: 2021-07-25 | Disposition: A | Discharge status: Home / Self Care | Payer: 59 | Source: Ambulatory Visit | Attending: General Surgery | Admitting: General Surgery

## 2021-07-25 ENCOUNTER — Telehealth: Payer: Self-pay | Admitting: Licensed Clinical Social Worker

## 2021-07-25 ENCOUNTER — Encounter: Payer: Self-pay | Admitting: Licensed Clinical Social Worker

## 2021-07-25 ENCOUNTER — Encounter (HOSPITAL_BASED_OUTPATIENT_CLINIC_OR_DEPARTMENT_OTHER)
Admission: RE | Admit: 2021-07-25 | Discharge: 2021-07-25 | Disposition: A | Discharge status: Home / Self Care | Payer: 59 | Source: Ambulatory Visit | Attending: General Surgery | Admitting: General Surgery

## 2021-07-25 ENCOUNTER — Other Ambulatory Visit (INDEPENDENT_AMBULATORY_CARE_PROVIDER_SITE_OTHER): Payer: Self-pay | Admitting: Family Medicine

## 2021-07-25 ENCOUNTER — Other Ambulatory Visit (HOSPITAL_COMMUNITY): Payer: Self-pay

## 2021-07-25 ENCOUNTER — Encounter: Payer: Self-pay | Admitting: *Deleted

## 2021-07-25 DIAGNOSIS — Z1379 Encounter for other screening for genetic and chromosomal anomalies: Secondary | ICD-10-CM | POA: Insufficient documentation

## 2021-07-25 DIAGNOSIS — R928 Other abnormal and inconclusive findings on diagnostic imaging of breast: Secondary | ICD-10-CM | POA: Diagnosis not present

## 2021-07-25 DIAGNOSIS — Z01812 Encounter for preprocedural laboratory examination: Secondary | ICD-10-CM | POA: Diagnosis not present

## 2021-07-25 DIAGNOSIS — E0801 Diabetes mellitus due to underlying condition with hyperosmolarity with coma: Secondary | ICD-10-CM | POA: Diagnosis not present

## 2021-07-25 DIAGNOSIS — C50412 Malignant neoplasm of upper-outer quadrant of left female breast: Secondary | ICD-10-CM

## 2021-07-25 DIAGNOSIS — E1169 Type 2 diabetes mellitus with other specified complication: Secondary | ICD-10-CM

## 2021-07-25 LAB — BASIC METABOLIC PANEL
Anion gap: 10 (ref 5–15)
BUN: 10 mg/dL (ref 6–20)
CO2: 27 mmol/L (ref 22–32)
Calcium: 9.3 mg/dL (ref 8.9–10.3)
Chloride: 101 mmol/L (ref 98–111)
Creatinine, Ser: 0.7 mg/dL (ref 0.44–1.00)
GFR, Estimated: >60 mL/min (ref >60)
Glucose, Bld: 93 mg/dL (ref 70–99)
Potassium: 3.3 mmol/L — ABNORMAL LOW (ref 3.5–5.1)
Sodium: 138 mmol/L (ref 135–145)

## 2021-07-25 LAB — POCT PREGNANCY, URINE: Preg Test, Ur: NEGATIVE

## 2021-07-25 IMAGING — MG MM PLC BREAST LOC DEV 1ST LESION INC MAMMO GUIDE*L*
8 of 9 series · 8 of 9 positions shown · non-contrast
Comparison: Recent mammogram, MR and biopsy examinations.

CLINICAL DATA: Pre lumpectomy localization of recently diagnosed
left breast invasive lobular carcinoma and lobular carcinoma in situ
marked with an X shaped biopsy marker clip left breast invasive
ductal carcinoma and ductal carcinoma in situ marked with a cylinder
shaped clip.

EXAM:
MAMMOGRAPHIC GUIDED RADIOACTIVE SEED LOCALIZATION OF THE LEFT BREAST
X 2

[L LM (1 of 2)]
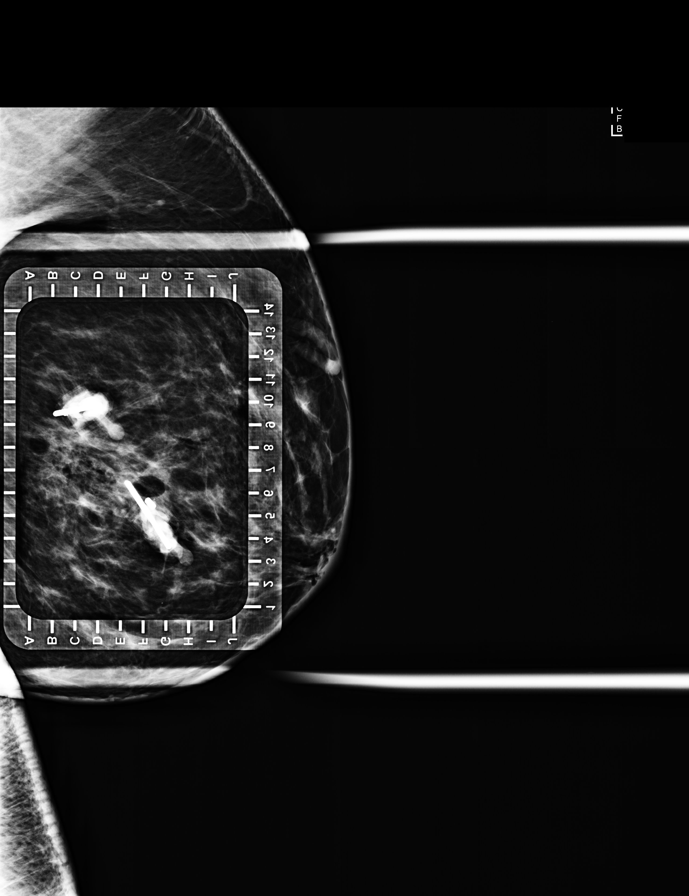

[L CC (1 of 5)]
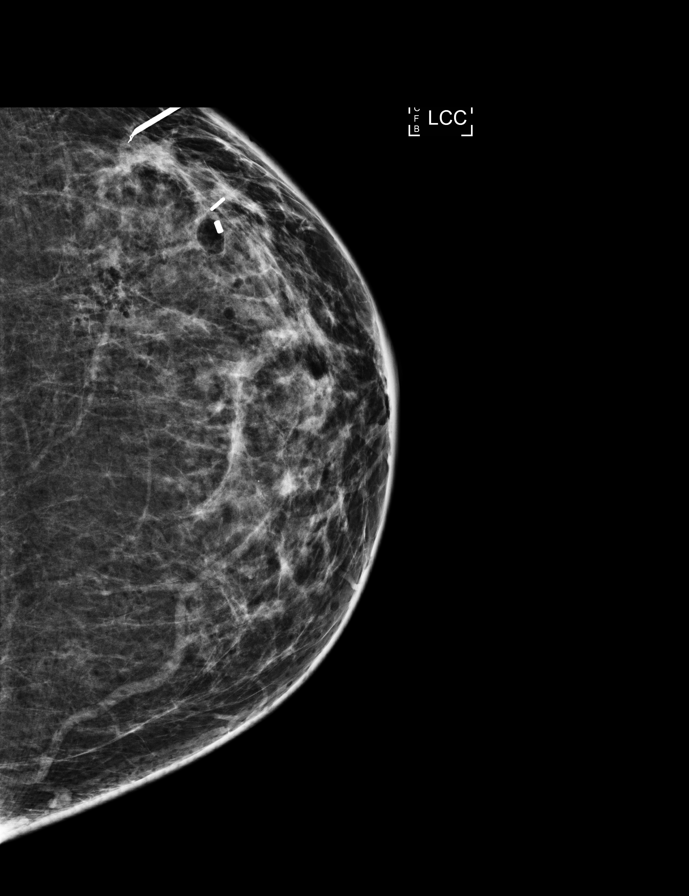

[L CC (2 of 5)]
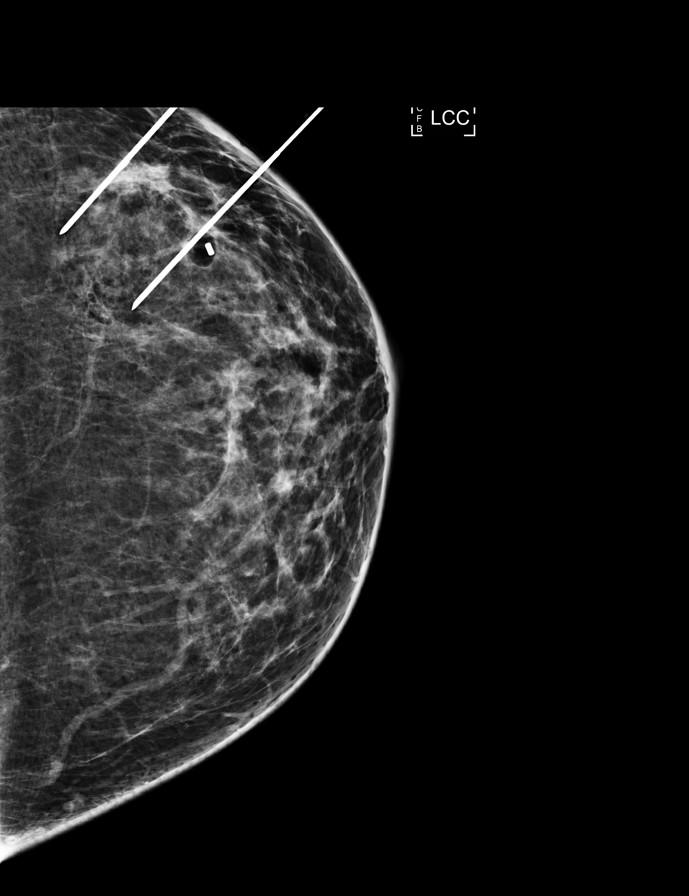

[L CC (3 of 5)]
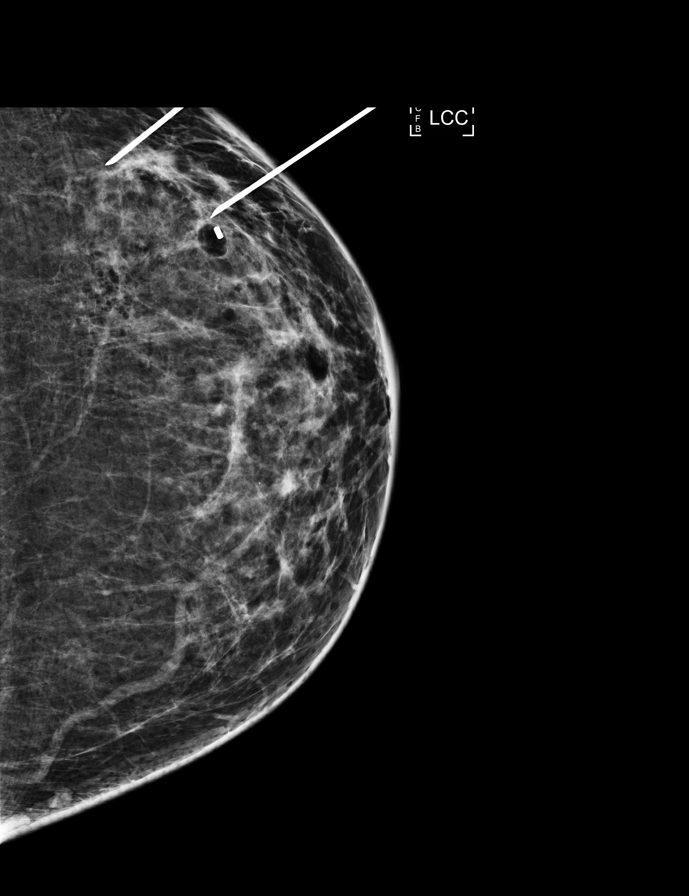

[L CC (4 of 5)]
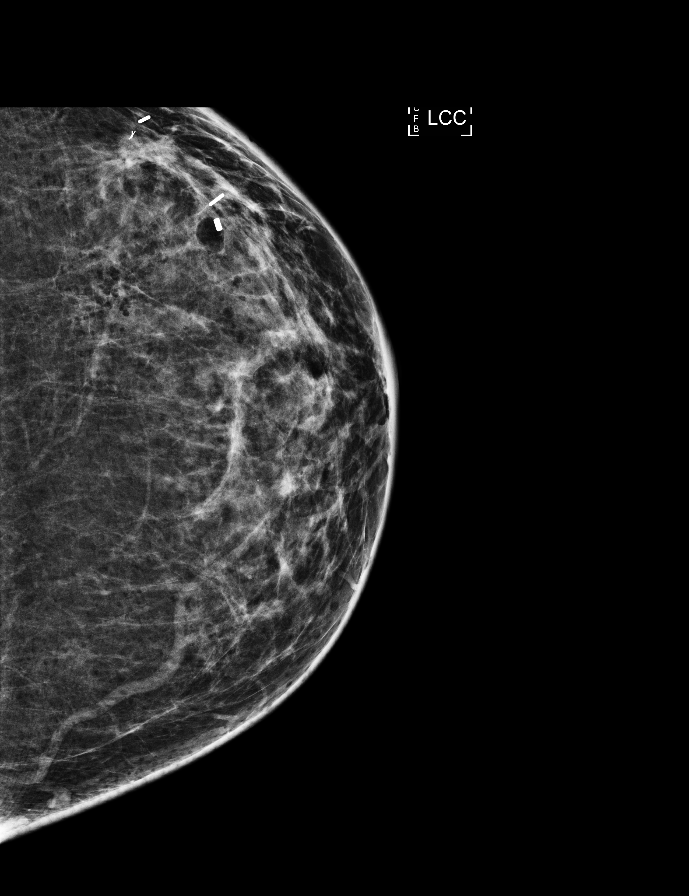

[L LM (2 of 2)]
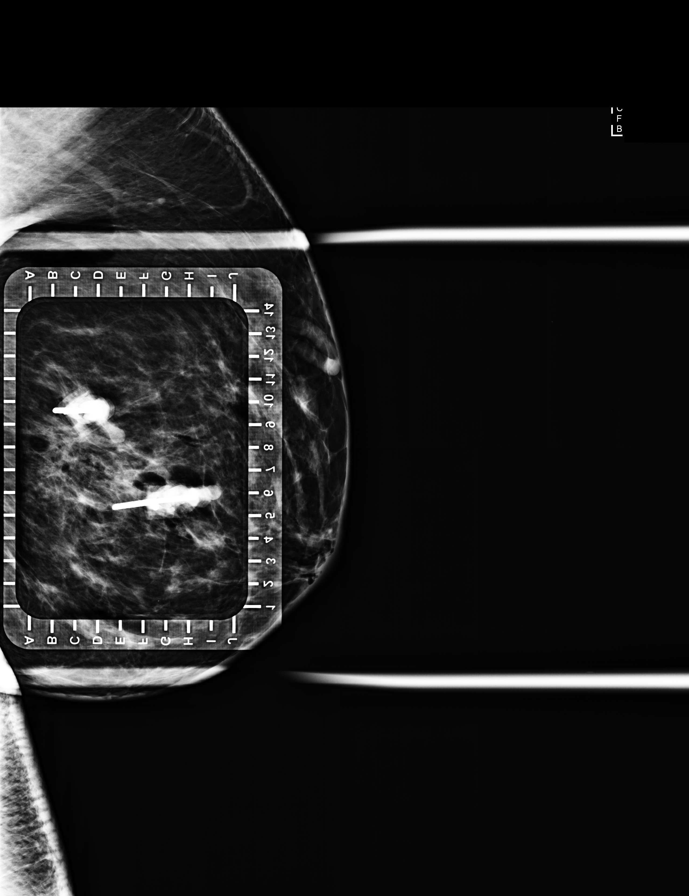

[L CC (5 of 5)]
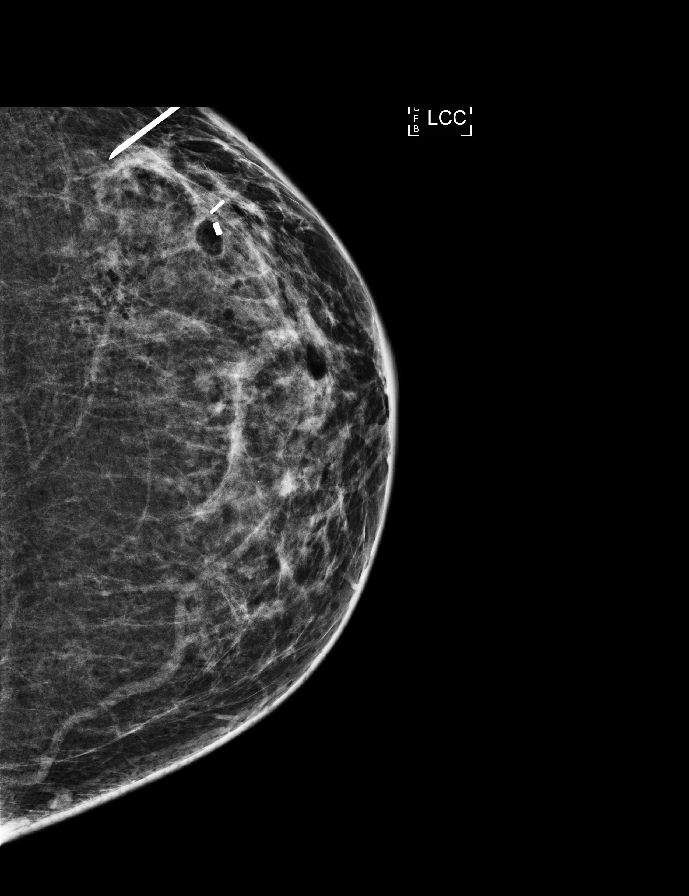

[L ML]
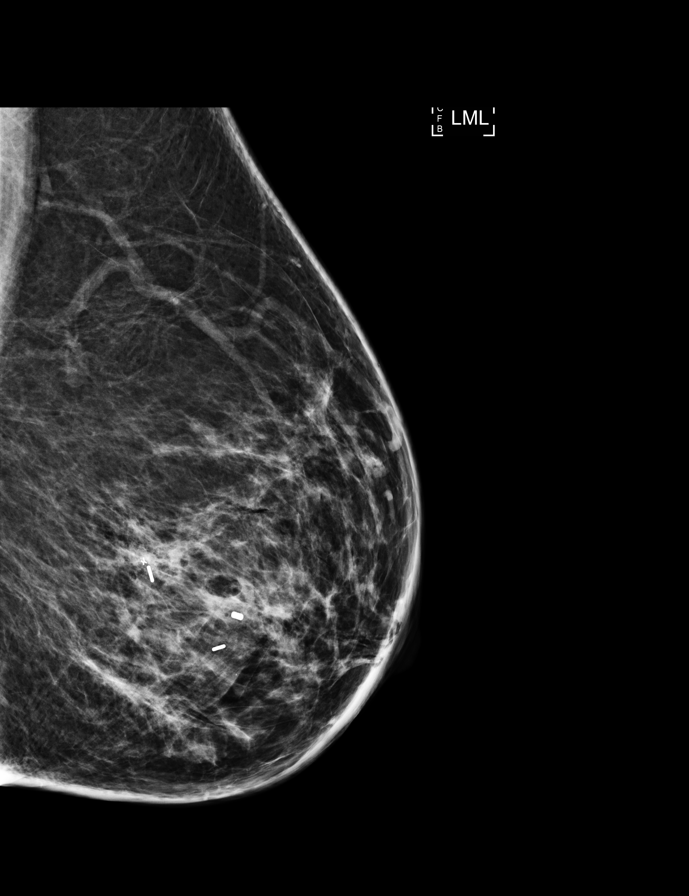

[8 of 9 positions shown; findings below may reference images not displayed]

FINDINGS: Patient presents for radioactive seed localization prior to
lumpectomy. I met with the patient and we discussed the procedure of
seed localization including benefits and alternatives. We discussed
the high likelihood of successful procedures. We discussed the risks
of the procedures including infection, bleeding, tissue injury and
further surgery. We discussed the low dose of radioactivity involved
in the procedures. Informed, written consent was given.

The usual time-out protocol was performed immediately prior to the
procedures.

Site 1: X SHAPED CLIP IN THE OUTER LEFT BREAST MARKING THE RECENTLY
BIOPSIED ILC AND LCIS

Using mammographic guidance, sterile technique, 1% lidocaine and an
[3T] radioactive seed, the X shaped clip in the outer left breast
was localized using a lateral approach. The follow-up mammogram
images confirm the seed in the expected location and were marked for
Dr. PAKLANG.

Follow-up survey of the patient confirms presence of the radioactive
seed.

Order number of [3T] seed:  [PHONE_NUMBER].

Total activity:  0.260 reference Date: [DATE]

SITE 2: CYLINDER-SHAPED CLIP IN THE OUTER LEFT BREAST MARKING THE
RECENTLY BIOPSIED IDC AND DCIS

Using mammographic guidance, sterile technique, 1% lidocaine and an
[3T] radioactive seed, the cylinder-shaped clip in the outer left
breast was localized using a lateral approach. The follow-up
mammogram images confirm the seed in the expected location and were
marked for Dr. PAKLANG.

Follow-up survey of the patient confirms presence of the radioactive
seed.

Order number of [3T] seed:  [PHONE_NUMBER].

Total activity:  0.260 reference Date: [DATE]

The patient tolerated the procedures well and was released from the
[REDACTED]. She was given instructions regarding seed removal.
IMPRESSION: Radioactive seed localization left breast X 2. No apparent
complications.

## 2021-07-25 MED ORDER — ENSURE PRE-SURGERY PO LIQD: 296.0000 mL | Freq: Once | ORAL | Status: DC

## 2021-07-25 NOTE — Progress Notes (Signed)

## 2021-07-25 NOTE — Telephone Encounter (Signed)
Revealed negative genetic testing on both the BRCAPlus panel + CancerNext-Expanded+RNA which both reported out today, 07/25/2021.  Revealed that a VUS in KIF1B was identified. This normal result is reassuring and indicates that it is unlikely Elizabeth Cummings's cancer is due to a hereditary cause.  It is unlikely that there is an increased risk of another cancer due to a mutation in one of these genes.  However, genetic testing is not perfect, and cannot definitively rule out a hereditary cause.  It will be important for her to keep in contact with genetics to learn if any additional testing may be needed in the future.    ? ? ?

## 2021-07-28 ENCOUNTER — Encounter (INDEPENDENT_AMBULATORY_CARE_PROVIDER_SITE_OTHER): Payer: Self-pay | Admitting: Family Medicine

## 2021-07-28 ENCOUNTER — Encounter (HOSPITAL_BASED_OUTPATIENT_CLINIC_OR_DEPARTMENT_OTHER): Payer: Self-pay | Admitting: General Surgery

## 2021-07-28 ENCOUNTER — Other Ambulatory Visit: Payer: Self-pay

## 2021-07-28 ENCOUNTER — Other Ambulatory Visit (HOSPITAL_COMMUNITY): Payer: Self-pay

## 2021-07-28 ENCOUNTER — Ambulatory Visit (HOSPITAL_BASED_OUTPATIENT_CLINIC_OR_DEPARTMENT_OTHER): Payer: 59 | Admitting: Anesthesiology

## 2021-07-28 ENCOUNTER — Ambulatory Visit
Admission: RE | Admit: 2021-07-28 | Discharge: 2021-07-28 | Disposition: A | Discharge status: Home / Self Care | Payer: 59 | Source: Ambulatory Visit | Attending: General Surgery | Admitting: General Surgery

## 2021-07-28 ENCOUNTER — Other Ambulatory Visit (INDEPENDENT_AMBULATORY_CARE_PROVIDER_SITE_OTHER): Payer: Self-pay | Admitting: Family Medicine

## 2021-07-28 ENCOUNTER — Telehealth: Payer: Self-pay | Admitting: Hematology and Oncology

## 2021-07-28 ENCOUNTER — Ambulatory Visit (HOSPITAL_BASED_OUTPATIENT_CLINIC_OR_DEPARTMENT_OTHER)
Admission: RE | Admit: 2021-07-28 | Discharge: 2021-07-28 | Disposition: A | Discharge status: Home / Self Care | Payer: 59 | Attending: General Surgery | Admitting: General Surgery

## 2021-07-28 ENCOUNTER — Encounter (HOSPITAL_BASED_OUTPATIENT_CLINIC_OR_DEPARTMENT_OTHER)
Admission: RE | Disposition: A | Discharge status: Home / Self Care | Payer: Self-pay | Source: Home / Self Care | Attending: General Surgery

## 2021-07-28 DIAGNOSIS — Z7985 Long-term (current) use of injectable non-insulin antidiabetic drugs: Secondary | ICD-10-CM | POA: Insufficient documentation

## 2021-07-28 DIAGNOSIS — D242 Benign neoplasm of left breast: Secondary | ICD-10-CM | POA: Diagnosis not present

## 2021-07-28 DIAGNOSIS — Z79899 Other long term (current) drug therapy: Secondary | ICD-10-CM | POA: Insufficient documentation

## 2021-07-28 DIAGNOSIS — C50412 Malignant neoplasm of upper-outer quadrant of left female breast: Secondary | ICD-10-CM

## 2021-07-28 DIAGNOSIS — Z170 Estrogen receptor positive status [ER+]: Secondary | ICD-10-CM | POA: Diagnosis not present

## 2021-07-28 DIAGNOSIS — C50912 Malignant neoplasm of unspecified site of left female breast: Secondary | ICD-10-CM | POA: Diagnosis not present

## 2021-07-28 DIAGNOSIS — E119 Type 2 diabetes mellitus without complications: Secondary | ICD-10-CM | POA: Insufficient documentation

## 2021-07-28 DIAGNOSIS — N62 Hypertrophy of breast: Secondary | ICD-10-CM | POA: Diagnosis not present

## 2021-07-28 DIAGNOSIS — I10 Essential (primary) hypertension: Secondary | ICD-10-CM | POA: Diagnosis not present

## 2021-07-28 DIAGNOSIS — E1169 Type 2 diabetes mellitus with other specified complication: Secondary | ICD-10-CM

## 2021-07-28 DIAGNOSIS — G8918 Other acute postprocedural pain: Secondary | ICD-10-CM | POA: Diagnosis not present

## 2021-07-28 DIAGNOSIS — E0801 Diabetes mellitus due to underlying condition with hyperosmolarity with coma: Secondary | ICD-10-CM

## 2021-07-28 DIAGNOSIS — C773 Secondary and unspecified malignant neoplasm of axilla and upper limb lymph nodes: Secondary | ICD-10-CM | POA: Diagnosis not present

## 2021-07-28 DIAGNOSIS — R928 Other abnormal and inconclusive findings on diagnostic imaging of breast: Secondary | ICD-10-CM | POA: Diagnosis not present

## 2021-07-28 HISTORY — PX: BREAST LUMPECTOMY WITH RADIOACTIVE SEED AND SENTINEL LYMPH NODE BIOPSY: SHX6550

## 2021-07-28 HISTORY — PX: BREAST LUMPECTOMY: SHX2

## 2021-07-28 HISTORY — DX: Type 2 diabetes mellitus without complications: E11.9

## 2021-07-28 LAB — GLUCOSE, CAPILLARY
Glucose-Capillary: 94 mg/dL (ref 70–99)
Glucose-Capillary: 109 mg/dL — ABNORMAL HIGH (ref 70–99)

## 2021-07-28 IMAGING — DX MM BREAST SURGICAL SPECIMEN
1 series · 2 of 2 positions shown · non-contrast
Comparison: Prior studies.

CLINICAL DATA: Status post bracketed seed localization of the LEFT
breast.

EXAM:
SPECIMEN RADIOGRAPH OF THE LEFT BREAST

[Series 1: specimen digital x-ray · left · 0.07mm/px · 2 of 2 slices shown]
[im 1/2]
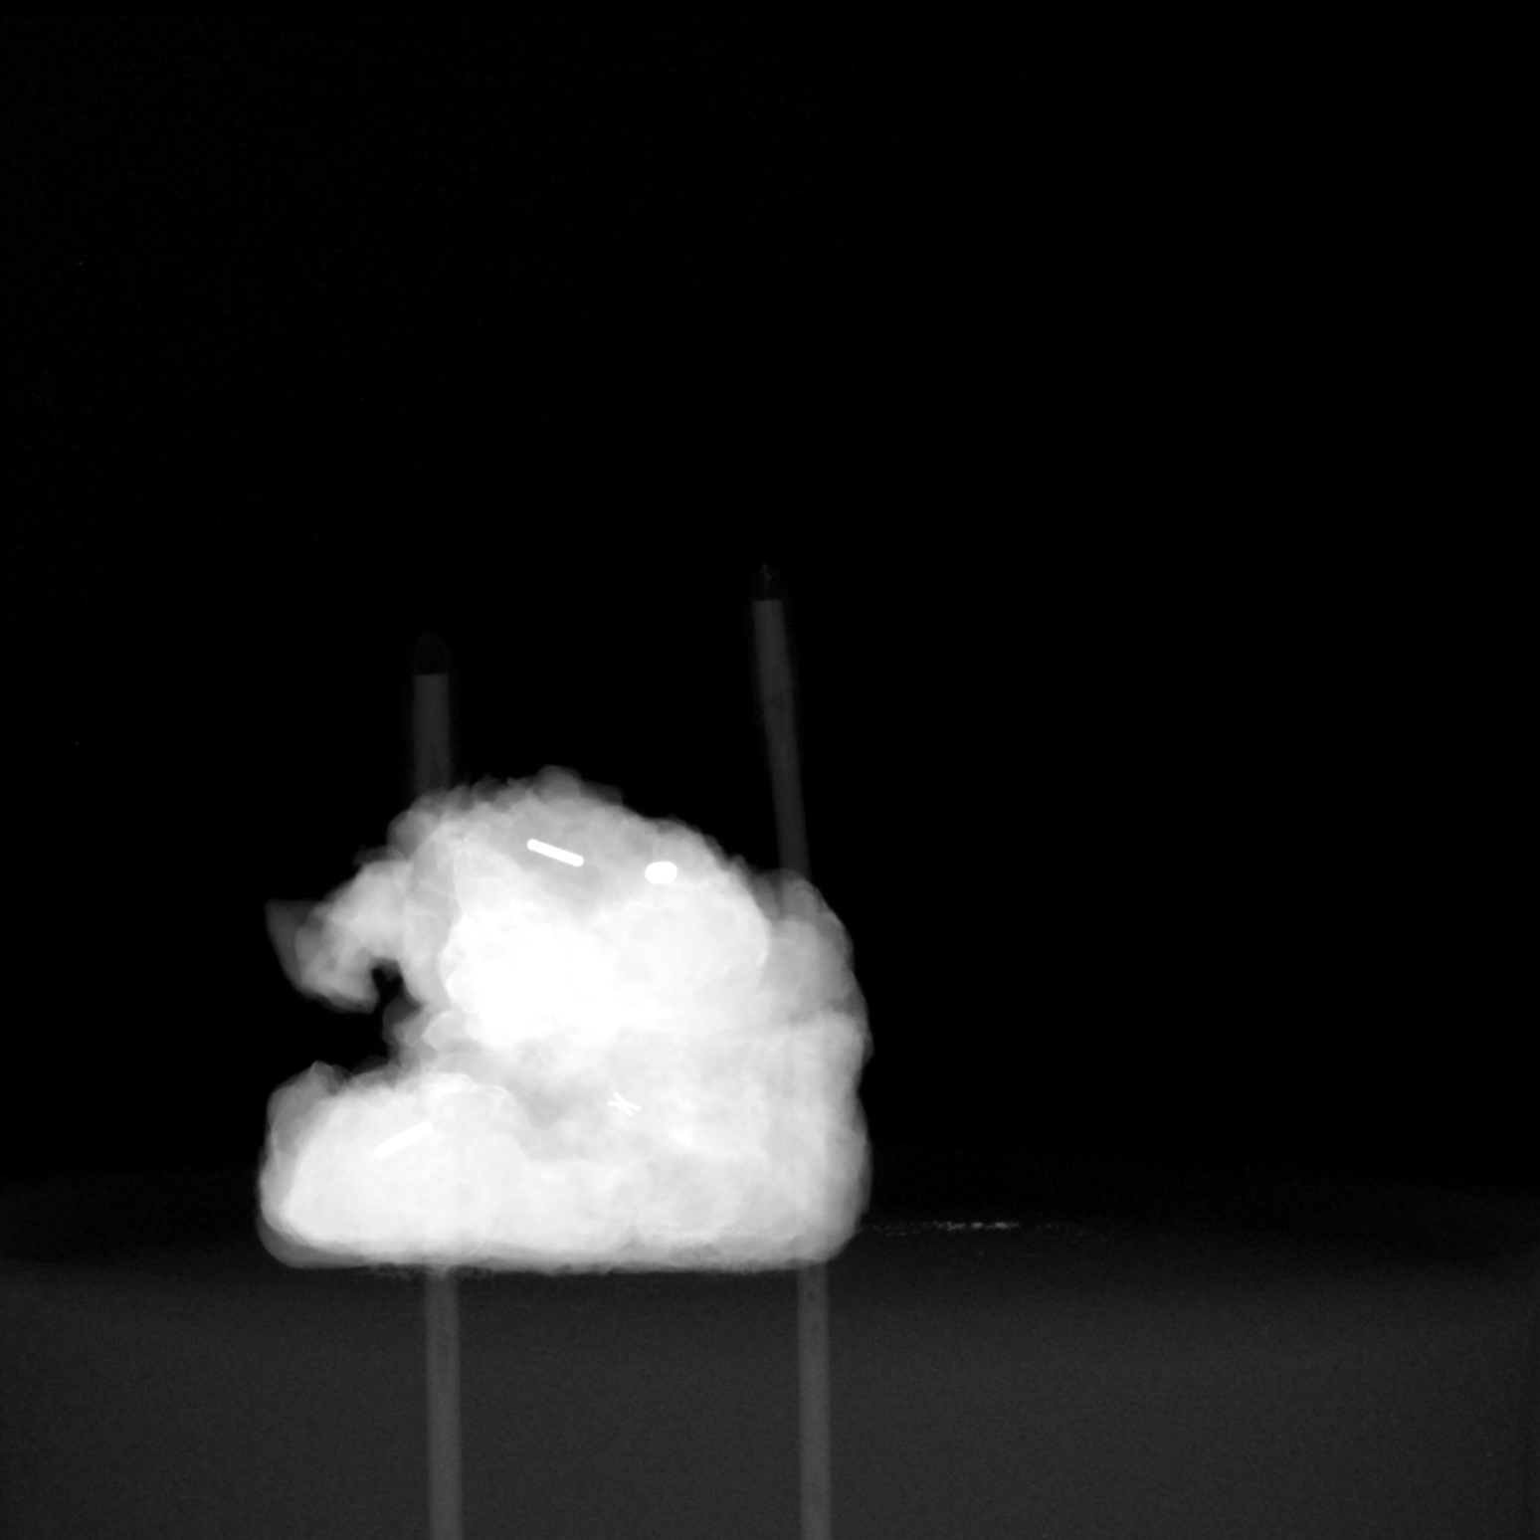
[im 2/2]
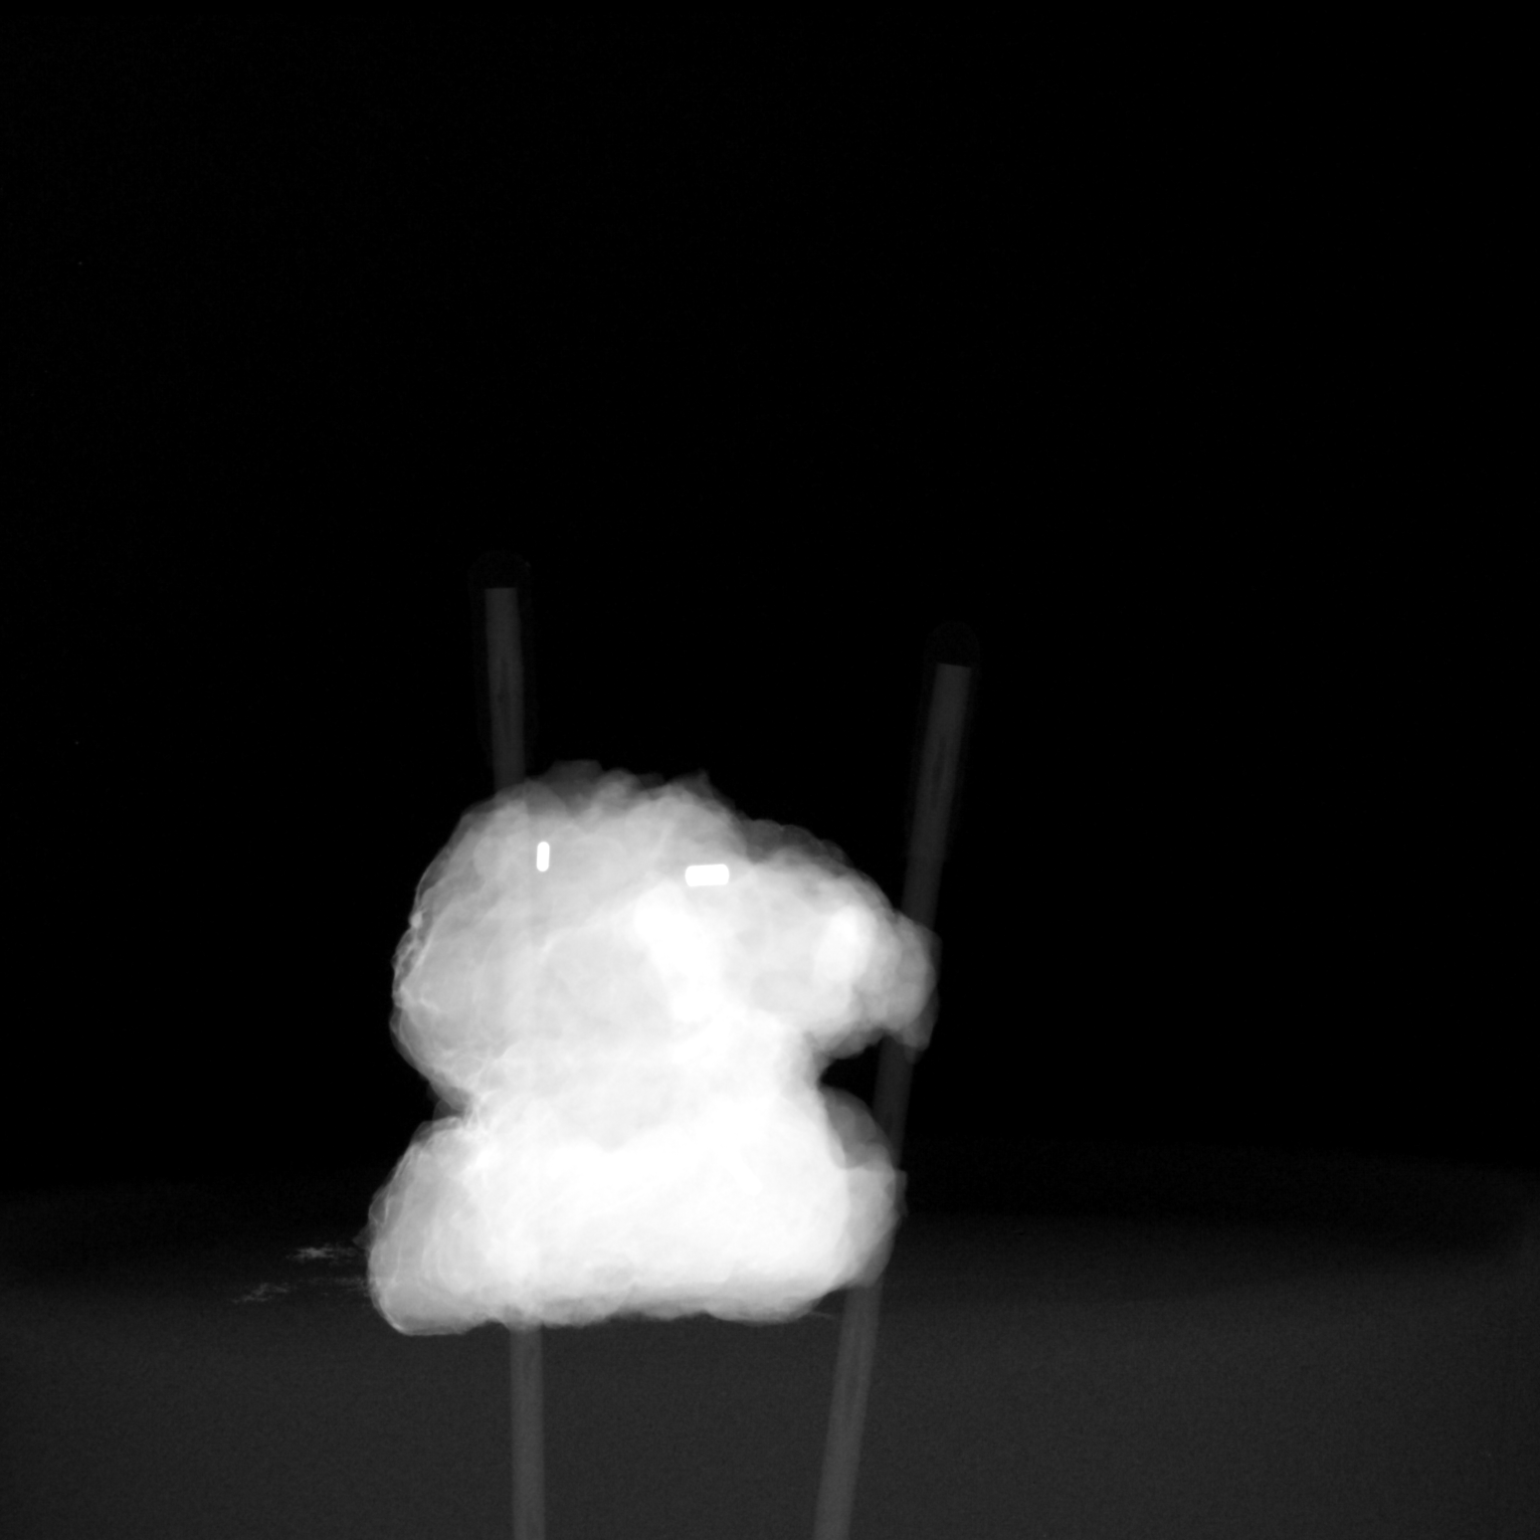

[2 of 2 positions shown; findings below may reference images not displayed]

FINDINGS: Status post excision of the left breast. Two radioactive seeds, an X
shaped clip, and a cylinder-shaped are present and completely
intact. The findings are discussed with the operating room nurse at
the time of interpretation.
IMPRESSION: Specimen radiograph of the left breast.

## 2021-07-28 SURGERY — BREAST LUMPECTOMY WITH RADIOACTIVE SEED AND SENTINEL LYMPH NODE BIOPSY
Anesthesia: General | Site: Breast | Laterality: Left

## 2021-07-28 MED ORDER — MIDAZOLAM HCL 2 MG/2ML IJ SOLN
INTRAMUSCULAR | Status: DC | PRN
Start: 2021-07-28 — End: 2021-07-28
  Administered 2021-07-28: 2 mg via INTRAVENOUS

## 2021-07-28 MED ORDER — FENTANYL CITRATE (PF) 100 MCG/2ML IJ SOLN
INTRAMUSCULAR | Status: DC | PRN
  Administered 2021-07-28: 25 ug via INTRAVENOUS
  Administered 2021-07-28: 50 ug via INTRAVENOUS
  Administered 2021-07-28: 25 ug via INTRAVENOUS

## 2021-07-28 MED ORDER — LACTATED RINGERS IV SOLN: INTRAVENOUS | Status: DC | PRN

## 2021-07-28 MED ORDER — ROPIVACAINE HCL 5 MG/ML IJ SOLN
INTRAMUSCULAR | Status: DC | PRN
  Administered 2021-07-28: 30 mL via PERINEURAL

## 2021-07-28 MED ORDER — OXYCODONE HCL 5 MG PO TABS: 5.0000 mg | ORAL_TABLET | Freq: Once | ORAL | Status: DC | PRN

## 2021-07-28 MED ORDER — ACETAMINOPHEN 500 MG PO TABS
ORAL_TABLET | ORAL | Status: AC
  Filled 2021-07-28: qty 2

## 2021-07-28 MED ORDER — ONDANSETRON HCL 4 MG/2ML IJ SOLN: 4.0000 mg | Freq: Once | INTRAMUSCULAR | Status: DC | PRN

## 2021-07-28 MED ORDER — AMISULPRIDE (ANTIEMETIC) 5 MG/2ML IV SOLN: 10.0000 mg | Freq: Once | INTRAVENOUS | Status: DC | PRN

## 2021-07-28 MED ORDER — CEFAZOLIN SODIUM-DEXTROSE 2-3 GM-%(50ML) IV SOLR
INTRAVENOUS | Status: DC | PRN
  Administered 2021-07-28: 2 g via INTRAVENOUS

## 2021-07-28 MED ORDER — MIDAZOLAM HCL 2 MG/2ML IJ SOLN
INTRAMUSCULAR | Status: AC
  Filled 2021-07-28: qty 2

## 2021-07-28 MED ORDER — ONDANSETRON HCL 4 MG/2ML IJ SOLN
INTRAMUSCULAR | Status: DC | PRN
Start: 2021-07-28 — End: 2021-07-28
  Administered 2021-07-28: 4 mg via INTRAVENOUS

## 2021-07-28 MED ORDER — LIDOCAINE 2% (20 MG/ML) 5 ML SYRINGE
INTRAMUSCULAR | Status: AC
  Filled 2021-07-28: qty 5

## 2021-07-28 MED ORDER — MAGTRACE LYMPHATIC TRACER
INTRAMUSCULAR | Status: DC | PRN
  Administered 2021-07-28: 2 mL via INTRAMUSCULAR

## 2021-07-28 MED ORDER — FENTANYL CITRATE (PF) 100 MCG/2ML IJ SOLN: 25.0000 ug | INTRAMUSCULAR | Status: DC | PRN

## 2021-07-28 MED ORDER — LIDOCAINE HCL (CARDIAC) PF 100 MG/5ML IV SOSY
PREFILLED_SYRINGE | INTRAVENOUS | Status: DC | PRN
Start: 2021-07-28 — End: 2021-07-28
  Administered 2021-07-28: 100 mg via INTRAVENOUS

## 2021-07-28 MED ORDER — CHLORHEXIDINE GLUCONATE CLOTH 2 % EX PADS: 6.0000 | MEDICATED_PAD | Freq: Once | CUTANEOUS | Status: DC

## 2021-07-28 MED ORDER — ONDANSETRON HCL 4 MG/2ML IJ SOLN
INTRAMUSCULAR | Status: AC
  Filled 2021-07-28: qty 2

## 2021-07-28 MED ORDER — OXYCODONE HCL 5 MG PO TABS
5.0000 mg | ORAL_TABLET | Freq: Four times a day (QID) | ORAL | 0 refills | Status: DC | PRN
  Filled 2021-07-28: qty 10, 3d supply, fill #0

## 2021-07-28 MED ORDER — EPHEDRINE SULFATE (PRESSORS) 50 MG/ML IJ SOLN
INTRAMUSCULAR | Status: DC | PRN
  Administered 2021-07-28: 10 mg via INTRAVENOUS

## 2021-07-28 MED ORDER — LACTATED RINGERS IV SOLN: INTRAVENOUS | Status: DC

## 2021-07-28 MED ORDER — FENTANYL CITRATE (PF) 100 MCG/2ML IJ SOLN
100.0000 ug | Freq: Once | INTRAMUSCULAR | Status: AC
  Administered 2021-07-28: 100 ug via INTRAVENOUS

## 2021-07-28 MED ORDER — DEXAMETHASONE SODIUM PHOSPHATE 10 MG/ML IJ SOLN
INTRAMUSCULAR | Status: DC | PRN
Start: 2021-07-28 — End: 2021-07-28
  Administered 2021-07-28: 5 mg via INTRAVENOUS

## 2021-07-28 MED ORDER — PROPOFOL 10 MG/ML IV BOLUS
INTRAVENOUS | Status: DC | PRN
  Administered 2021-07-28: 200 mg via INTRAVENOUS

## 2021-07-28 MED ORDER — CEFAZOLIN SODIUM-DEXTROSE 2-4 GM/100ML-% IV SOLN: 2.0000 g | INTRAVENOUS | Status: DC

## 2021-07-28 MED ORDER — DEXAMETHASONE SODIUM PHOSPHATE 10 MG/ML IJ SOLN
INTRAMUSCULAR | Status: AC
  Filled 2021-07-28: qty 1

## 2021-07-28 MED ORDER — ACETAMINOPHEN 500 MG PO TABS
1000.0000 mg | ORAL_TABLET | ORAL | Status: AC
  Administered 2021-07-28: 1000 mg via ORAL

## 2021-07-28 MED ORDER — TIRZEPATIDE 12.5 MG/0.5ML ~~LOC~~ SOAJ
12.5000 mg | SUBCUTANEOUS | 2 refills | Status: DC
  Filled 2021-07-28: qty 2, 28d supply, fill #0

## 2021-07-28 MED ORDER — FENTANYL CITRATE (PF) 100 MCG/2ML IJ SOLN
INTRAMUSCULAR | Status: AC
  Filled 2021-07-28: qty 2

## 2021-07-28 MED ORDER — OXYCODONE HCL 5 MG/5ML PO SOLN: 5.0000 mg | Freq: Once | ORAL | Status: DC | PRN

## 2021-07-28 MED ORDER — BUPIVACAINE HCL (PF) 0.25 % IJ SOLN
INTRAMUSCULAR | Status: AC
  Filled 2021-07-28: qty 30

## 2021-07-28 MED ORDER — CEFAZOLIN SODIUM-DEXTROSE 2-4 GM/100ML-% IV SOLN
INTRAVENOUS | Status: AC
  Filled 2021-07-28: qty 100

## 2021-07-28 MED ORDER — KETOROLAC TROMETHAMINE 15 MG/ML IJ SOLN
15.0000 mg | INTRAMUSCULAR | Status: AC
  Administered 2021-07-28: 15 mg via INTRAVENOUS

## 2021-07-28 MED ORDER — BUPIVACAINE HCL (PF) 0.25 % IJ SOLN
INTRAMUSCULAR | Status: DC | PRN
Start: 2021-07-28 — End: 2021-07-28
  Administered 2021-07-28: 10 mL

## 2021-07-28 MED ORDER — MIDAZOLAM HCL 2 MG/2ML IJ SOLN
2.0000 mg | Freq: Once | INTRAMUSCULAR | Status: AC
  Administered 2021-07-28: 2 mg via INTRAVENOUS

## 2021-07-28 MED ORDER — KETOROLAC TROMETHAMINE 15 MG/ML IJ SOLN
INTRAMUSCULAR | Status: AC
  Filled 2021-07-28: qty 1

## 2021-07-28 SURGICAL SUPPLY — 47 items
ADH SKN CLS APL DERMABOND .7 (GAUZE/BANDAGES/DRESSINGS) ×1
APL PRP STRL LF DISP 70% ISPRP (MISCELLANEOUS) ×1
APPLIER CLIP 9.375 MED OPEN (MISCELLANEOUS) ×2
APR CLP MED 9.3 20 MLT OPN (MISCELLANEOUS) ×1
BINDER BREAST XXLRG (GAUZE/BANDAGES/DRESSINGS) ×1 IMPLANT
BLADE SURG 15 STRL LF DISP TIS (BLADE) ×1 IMPLANT
BLADE SURG 15 STRL SS (BLADE) ×2
CANISTER SUCT 1200ML W/VALVE (MISCELLANEOUS) ×1 IMPLANT
CHLORAPREP W/TINT 26 (MISCELLANEOUS) ×2 IMPLANT
CLIP APPLIE 9.375 MED OPEN (MISCELLANEOUS) IMPLANT
COVER BACK TABLE 60X90IN (DRAPES) ×2 IMPLANT
COVER MAYO STAND STRL (DRAPES) ×2 IMPLANT
COVER PROBE W GEL 5X96 (DRAPES) ×3 IMPLANT
DERMABOND ADVANCED (GAUZE/BANDAGES/DRESSINGS) ×1
DERMABOND ADVANCED .7 DNX12 (GAUZE/BANDAGES/DRESSINGS) ×1 IMPLANT
DRAPE LAPAROSCOPIC ABDOMINAL (DRAPES) ×2 IMPLANT
DRAPE UTILITY XL STRL (DRAPES) ×2 IMPLANT
ELECT COATED BLADE 2.86 ST (ELECTRODE) ×2 IMPLANT
ELECT REM PT RETURN 9FT ADLT (ELECTROSURGICAL) ×2
ELECTRODE REM PT RTRN 9FT ADLT (ELECTROSURGICAL) ×1 IMPLANT
GLOVE BIO SURGEON STRL SZ7 (GLOVE) ×4 IMPLANT
GLOVE BIOGEL PI IND STRL 7.5 (GLOVE) ×1 IMPLANT
GLOVE BIOGEL PI INDICATOR 7.5 (GLOVE) ×1
GOWN STRL REUS W/ TWL LRG LVL3 (GOWN DISPOSABLE) ×2 IMPLANT
GOWN STRL REUS W/TWL LRG LVL3 (GOWN DISPOSABLE) ×4
HEMOSTAT ARISTA ABSORB 3G PWDR (HEMOSTASIS) ×1 IMPLANT
KIT MARKER MARGIN INK (KITS) ×2 IMPLANT
NDL HYPO 25X1 1.5 SAFETY (NEEDLE) ×1 IMPLANT
NEEDLE HYPO 25X1 1.5 SAFETY (NEEDLE) ×2 IMPLANT
NS IRRIG 1000ML POUR BTL (IV SOLUTION) ×1 IMPLANT
PACK BASIN DAY SURGERY FS (CUSTOM PROCEDURE TRAY) ×2 IMPLANT
PENCIL SMOKE EVACUATOR (MISCELLANEOUS) ×2 IMPLANT
SLEEVE SCD COMPRESS KNEE MED (STOCKING) ×2 IMPLANT
SPONGE T-LAP 4X18 ~~LOC~~+RFID (SPONGE) ×3 IMPLANT
STRIP CLOSURE SKIN 1/2X4 (GAUZE/BANDAGES/DRESSINGS) ×2 IMPLANT
SUT MNCRL AB 4-0 PS2 18 (SUTURE) ×3 IMPLANT
SUT SILK 2 0 SH (SUTURE) ×2 IMPLANT
SUT VIC AB 2-0 SH 27 (SUTURE) ×8
SUT VIC AB 2-0 SH 27XBRD (SUTURE) ×1 IMPLANT
SUT VIC AB 3-0 SH 27 (SUTURE) ×4
SUT VIC AB 3-0 SH 27X BRD (SUTURE) ×1 IMPLANT
SYR CONTROL 10ML LL (SYRINGE) ×2 IMPLANT
TOWEL GREEN STERILE FF (TOWEL DISPOSABLE) ×2 IMPLANT
TRACER MAGTRACE VIAL (MISCELLANEOUS) ×1 IMPLANT
TRAY FAXITRON CT DISP (TRAY / TRAY PROCEDURE) ×2 IMPLANT
TUBE CONNECTING 20X1/4 (TUBING) ×1 IMPLANT
YANKAUER SUCT BULB TIP NO VENT (SUCTIONS) ×1 IMPLANT

## 2021-07-28 NOTE — Discharge Instructions (Addendum)
[facility] ?Office Phone Number [phone] ? ?POST OP INSTRUCTIONS ?Take 400 mg of ibuprofen every 8 hours or 650 mg tylenol every 6 hours for next 72 hours then as needed. Use ice several times daily also. ?Tylenol 1000 mg given at 1145, NO ibuprofen until 5:45 p.m. ? ?IF YOU HAVE DISABILITY OR FAMILY LEAVE FORMS, YOU MUST BRING THEM TO THE OFFICE FOR PROCESSING.  DO NOT GIVE THEM TO YOUR DOCTOR. ? ?A prescription for pain medication may be given to you upon discharge.  Take your pain medication as prescribed, if needed.  If narcotic pain medicine is not needed, then you may take acetaminophen (Tylenol), naprosyn (Alleve) or ibuprofen (Advil) as needed. ?Take your usually prescribed medications unless otherwise directed ?If you need a refill on your pain medication, please contact your pharmacy.  They will contact our office to request authorization.  Prescriptions will not be filled after 5pm or on week-ends. ?You should eat very light the first 24 hours after surgery, such as soup, crackers, pudding, etc.  Resume your normal diet the day after surgery. ?Most patients will experience some swelling and bruising in the breast.  Ice packs and a good support bra will help.  Wear the breast binder provided or a sports bra for 72 hours day and night.  After that wear a sports bra during the day until you return to the office. Swelling and bruising can take several days to resolve.  ?It is common to experience some constipation if taking pain medication after surgery.  Increasing fluid intake and taking a stool softener will usually help or prevent this problem from occurring.  A mild laxative (Milk of Magnesia or Miralax) should be taken according to package directions if there are no bowel movements after 48 hours. ?I used skin glue on the incision, you may shower in 24 hours.  The glue will flake off over the next 2-3 weeks.  Any sutures or staples will be removed at the office during your follow-up  visit. ?ACTIVITIES:  You may resume regular daily activities (gradually increasing) beginning the next day.  Wearing a good support bra or sports bra minimizes pain and swelling.  You may have sexual intercourse when it is comfortable. ?You may drive when you no longer are taking prescription pain medication, you can comfortably wear a seatbelt, and you can safely maneuver your car and apply brakes. ?RETURN TO WORK:  ______________________________________________________________________________________ ?You should see your doctor in the office for a follow-up appointment approximately two weeks after your surgery.  Your doctor?s nurse will typically make your follow-up appointment when she calls you with your pathology report.  Expect your pathology report 3-4 business days after your surgery.  You may call to check if you do not hear from [place] after three days. ?OTHER INSTRUCTIONS: _______________________________________________________________________________________________ _____________________________________________________________________________________________________________________________________ ?_____________________________________________________________________________________________________________________________________ ?_____________________________________________________________________________________________________________________________________ ? ?WHEN TO CALL DR WAKEFIELD: ?Fever over 101.0 ?Nausea and/or vomiting. ?Extreme swelling or bruising. ?Continued bleeding from incision. ?Increased pain, redness, or drainage from the incision. ? ?The clinic staff is available to answer your questions during regular business hours.  Please don?t hesitate to call and ask to speak to one of the nurses for clinical concerns.  If you have a medical emergency, go to the nearest emergency room or call 911.  A surgeon from [facility] is always on call at the hospital. ? ?For further  questions, please visit [email or web address] ? ?Post Anesthesia Home Care Instructions ? ?Activity: ?Get plenty of rest for the remainder of the  day. A responsible individual must stay with you for 24 hours following the procedure.  ?For the next 24 hours, DO NOT: ?-Drive a car ?-Paediatric nurse ?-Drink alcoholic beverages ?-Take any medication unless instructed by your physician ?-Make any legal decisions or sign important papers. ? ?Meals: ?Start with liquid foods such as gelatin or soup. Progress to regular foods as tolerated. Avoid greasy, spicy, heavy foods. If nausea and/or vomiting occur, drink only clear liquids until the nausea and/or vomiting subsides. Call your physician if vomiting continues. ? ?Special Instructions/Symptoms: ?Your throat may feel dry or sore from the anesthesia or the breathing tube placed in your throat during surgery. If this causes discomfort, gargle with warm salt water. The discomfort should disappear within 24 hours. ? ?If you had a scopolamine patch placed behind your ear for the management of post- operative nausea and/or vomiting: ? ?1. The medication in the patch is effective for 72 hours, after which it should be removed.  Wrap patch in a tissue and discard in the trash. Wash hands thoroughly with soap and water. ?2. You may remove the patch earlier than 72 hours if you experience unpleasant side effects which may include dry mouth, dizziness or visual disturbances. ?3. Avoid touching the patch. Wash your hands with soap and water after contact with the patch. ?    ? ?

## 2021-07-28 NOTE — Telephone Encounter (Signed)
.  Called patient to schedule appointment per 5/8 inbasket, patient is aware of date and time.   ?

## 2021-07-28 NOTE — Anesthesia Procedure Notes (Signed)
Anesthesia Regional Block: Pectoralis block  ? ?Pre-Anesthetic Checklist: , timeout performed,  Correct Patient, Correct Site, Correct Laterality,  Correct Procedure, Correct Position, site marked,  Risks and benefits discussed,  Surgical consent,  Pre-op evaluation,  At surgeon's request and post-op pain management ? ?Laterality: Left ? ?Prep: chloraprep     ?  ?Needles:  ?Injection technique: Single-shot ? ?Needle Type: Echogenic Stimulator Needle   ? ? ?Needle Length: 10cm  ?Needle Gauge: 20  ? ? ? ?Additional Needles: ? ? ?Procedures:,,,, ultrasound used (permanent image in chart),,    ?Narrative:  ?Start time: 07/28/2021 12:36 PM ?End time: 07/28/2021 12:41 PM ?Injection made incrementally with aspirations every 5 mL. ? ?Performed by: Personally  ?Anesthesiologist: Lidia Collum, MD ? ?Additional Notes: ?Standard monitors applied. Skin prepped. Good needle visualization with ultrasound. Injection made in 5cc increments with no resistance to injection. Patient tolerated the procedure well. ? ? ? ? ? ?

## 2021-07-28 NOTE — Transfer of Care (Signed)
Immediate Anesthesia Transfer of Care Note ? ?Patient: Elizabeth Cummings ? ?Procedure(s) Performed: LEFT BREAST BRACKETED LUMPECTOMY WITH RADIOACTIVE SEED AND AXILLARY SENTINEL LYMPH NODE BIOPSY (Left: Breast) ? ?Patient Location: PACU ? ?Anesthesia Type:General and Regional ? ?Level of Consciousness: drowsy ? ?Airway & Oxygen Therapy: Patient Spontanous Breathing and Patient connected to face mask oxygen ? ?Post-op Assessment: Report given to RN and Post -op Vital signs reviewed and stable ? ?Post vital signs: Reviewed and stable ? ?Last Vitals:  ?Vitals Value Taken Time  ?BP 109/66 07/28/21 1458  ?Temp    ?Pulse 80 07/28/21 1459  ?Resp 14 07/28/21 1459  ?SpO2 99 % 07/28/21 1459  ?Vitals shown include unvalidated device data. ? ?Last Pain: There were no vitals filed for this visit.   ? ?  ? ?Complications: No notable events documented. ?

## 2021-07-28 NOTE — Anesthesia Preprocedure Evaluation (Addendum)
Anesthesia Evaluation  ?Patient identified by MRN, date of birth, ID band ?Patient awake ? ? ? ?Reviewed: ?Allergy & Precautions, NPO status , Patient's Chart, lab work & pertinent test results ? ?History of Anesthesia Complications ?Negative for: history of anesthetic complications ? ?Airway ?Mallampati: II ? ?TM Distance: >3 FB ?Neck ROM: Full ? ? ? Dental ? ?(+) Teeth Intact, Dental Advisory Given ?  ?Pulmonary ?neg pulmonary ROS,  ?  ?Pulmonary exam normal ? ? ? ? ? ? ? Cardiovascular ?hypertension, Pt. on medications ?Normal cardiovascular exam ? ? ?  ?Neuro/Psych ?negative neurological ROS ?   ? GI/Hepatic ?negative GI ROS, Neg liver ROS,   ?Endo/Other  ?diabetes ? Renal/GU ?negative Renal ROS  ?negative genitourinary ?  ?Musculoskeletal ?negative musculoskeletal ROS ?(+)  ? Abdominal ?  ?Peds ? Hematology ?negative hematology ROS ?(+)   ?Anesthesia Other Findings ?Breast cancer ? Reproductive/Obstetrics ? ?  ? ? ? ? ? ? ? ? ? ? ? ? ? ?  ?  ? ? ? ? ? ? ? ? ?Anesthesia Physical ?Anesthesia Plan ? ?ASA: 2 ? ?Anesthesia Plan: General  ? ?Post-op Pain Management: Regional block* and Tylenol PO (pre-op)*  ? ?Induction: Intravenous ? ?PONV Risk Score and Plan: 3 and Ondansetron, Dexamethasone, Midazolam and Treatment may vary due to age or medical condition ? ?Airway Management Planned: LMA ? ?Additional Equipment: None ? ?Intra-op Plan:  ? ?Post-operative Plan: Extubation in OR ? ?Informed Consent: I have reviewed the patients History and Physical, chart, labs and discussed the procedure including the risks, benefits and alternatives for the proposed anesthesia with the patient or authorized representative who has indicated his/her understanding and acceptance.  ? ? ? ?Dental advisory given ? ?Plan Discussed with:  ? ?Anesthesia Plan Comments:   ? ? ? ? ? ? ?Anesthesia Quick Evaluation ? ?

## 2021-07-28 NOTE — H&P (Signed)
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 She has a prior history of a benign right breast radiologic biopsy but no other history. She has a family history of an aunt with gastric cancer and a paternal grandfather with lung cancer who was a smoker. She had no mass or discharge. She underwent a screening mammogram that shows B density breast. The right side apparently was negative. The left side had a subtle distortion in the outer left breast. This was recalled and underwent a biopsy. There was no ultrasound correlate. Her lymph nodes were also normal by ultrasound. Biopsy shows a grade 1 invasive lobular carcinoma that is 90% ER positive, 80% PR positive, HER2 negative and proliferation index was 4%. She underwent mri with 2.7 cm index lesion and then a 1.2 cm satellite next to it.  She had another biopsy to mark this for excision that is also positive.  ? ?Review of Systems: ?A complete review of systems was obtained from the patient. I have reviewed this information and discussed as appropriate with the patient. See HPI as well for other ROS. ? ?Review of Systems  ?HENT: Positive for hearing loss.  ?Skin: Positive for rash.  ?Neurological: Positive for headaches.  ?All other systems reviewed and are negative. ? ? ?Medical History: ?Past Medical History:  ?Diagnosis Date  ? Celiac disease  ? Diabetes mellitus without complication (CMS-HCC)  ? ?There is no problem list on file for this patient. ? ?Past Surgical History:  ?Procedure Laterality Date  ? APPENDECTOMY  ? CESAREAN SECTION  ? CHOLECYSTECTOMY  ? ? ?Allergies  ?Allergen Reactions  ? Gluten Unknown  ? Hydromorphone Hives  ?[place] [id number] ?Young Harris [id number]:[id number] ? ?Other reaction(s): Unknown ? ? ?Current Outpatient Medications on File Prior to Visit  ?Medication Sig Dispense Refill  ? azelastine (ASTELIN) 137 mcg nasal spray azelastine 137 mcg (0.1 %) nasal spray aerosol  ? Bacillus coagulan/calcium carb (DIGESTIVE ADVANTAGE  PROBIOTIC ORAL) Take by mouth  ? calcium carbonate-vitamin D3 (OS-CAL 500+D) 500 mg-5 mcg (200 unit) tablet Take 1 tablet by mouth once daily  ? fexofenadine (ALLEGRA) 180 MG tablet fexofenadine 180 mg tablet ?TAKE 1 TABLET BY MOUTH EVERY MORNING  ? fluticasone propionate (FLONASE) 50 mcg/actuation nasal spray fluticasone propionate 50 mcg/actuation nasal spray,suspension ?SPRAY 2 SPRAYS INTO EACH NOSTRIL EVERY DAY  ? hydroCHLOROthiazide (HYDRODIURIL) 25 MG tablet hydrochlorothiazide 25 mg tablet  ? inulin (FIBER GUMMIES ORAL) Take by mouth  ? levocetirizine (XYZAL) 5 MG tablet levocetirizine 5 mg tablet  ? MOUNJARO 12.5 mg/0.5 mL PnIj  ? multivitamin tablet Take 1 tablet by mouth once daily  ? pravastatin (PRAVACHOL) 20 MG tablet pravastatin 20 mg tablet  ? ?Family History  ?Problem Relation Age of Onset  ? Stroke Mother  ? Obesity Mother  ? High blood pressure (Hypertension) Mother  ? Hyperlipidemia (Elevated cholesterol) Mother  ? Diabetes Mother  ? Stroke Father  ? Hyperlipidemia (Elevated cholesterol) Father  ? High blood pressure (Hypertension) Father  ? Obesity Father  ? Obesity Sister  ? High blood pressure (Hypertension) Sister  ? Hyperlipidemia (Elevated cholesterol) Sister  ? ? ?Social History  ? ?Tobacco Use  ?Smoking Status Never  ?Smokeless Tobacco Never  ? ? ?Social History  ? ?Socioeconomic History  ? Marital status: Single  ?Tobacco Use  ? Smoking status: Never  ? Smokeless tobacco: Never  ?Vaping Use  ? Vaping Use: Never used  ?Substance and Sexual Activity  ? Alcohol use: Not Currently  ? Drug  use: Never  ? ?Objective:  ? ?Vitals:  ?07/08/21 1616  ?BP: (!) 142/92  ?Pulse: (!) 124  ?Temp: 36.8 ?C (98.2 ?F)  ?SpO2: 98%  ?Weight: 92.1 kg (203 lb)  ?Height: 162.6 cm (5' 4" )  ? ?Body mass index is 34.84 kg/m?. ? ?Physical Exam ?Vitals reviewed.  ?Constitutional:  ?Appearance: Normal appearance.  ?Chest:  ?Breasts: ?Right: No inverted nipple, mass or nipple discharge.  ?Left: No inverted nipple, mass or  nipple discharge.  ?Lymphadenopathy:  ?Upper Body:  ?Right upper body: No supraclavicular or axillary adenopathy.  ?Left upper body: No supraclavicular or axillary adenopathy.  ?Neurological:  ?Mental Status: She is alert.  ? ? ? ?Assessment and Plan:  ? ?Malignant neoplasm of upper-outer quadrant of left breast in female, estrogen receptor positive (CMS-HCC) ? ?left breast seed bracketed lumpectomy and left ax sn biopsy ? ?We discussed the staging and pathophysiology of breast cancer. We discussed all of the different options for treatment for breast cancer including surgery, chemotherapy, radiation therapy, Herceptin, and antiestrogen therapy. ?We discussed a sentinel lymph node biopsy as she does not appear to having lymph node involvement right now. We discussed the performance of that with injection of radioactive tracer. We discussed that there is a chance of having a positive node with a sentinel lymph node biopsy and we will await the permanent pathology to make any other first further decisions in terms of her treatment. We discussed up to a 5% risk lifetime of chronic shoulder pain as well as lymphedema associated with a sentinel lymph node biopsy. ?We discussed the options for treatment of the breast cancer which included lumpectomy versus a mastectomy. We discussed the performance of the lumpectomy with radioactive seed placement. We discussed a 5-10% chance of a positive margin requiring reexcision in the operating room. We also discussed that she will likely need radiation therapy if she undergoes lumpectomy. We discussed mastectomy and the postoperative care for that as well. Mastectomy can be followed by reconstruction. The decision for lumpectomy vs mastectomy has no impact on decision for chemotherapy. Most mastectomy patients will not need radiation therapy. We discussed that there is no difference in her survival whether she undergoes lumpectomy with radiation therapy or antiestrogen therapy  versus a mastectomy. There is also no real difference between her recurrence in the breast. ?We discussed the risks of operation including bleeding, infection, possible reoperation. She understands her further therapy will be based on what her stages at the time of her operation. ?

## 2021-07-28 NOTE — Anesthesia Postprocedure Evaluation (Signed)
Anesthesia Post Note ? ?Patient: Elizabeth Cummings ? ?Procedure(s) Performed: LEFT BREAST BRACKETED LUMPECTOMY WITH RADIOACTIVE SEED AND AXILLARY SENTINEL LYMPH NODE BIOPSY (Left: Breast) ? ?  ? ?Patient location during evaluation: PACU ?Anesthesia Type: General ?Level of consciousness: awake and alert ?Pain management: pain level controlled ?Vital Signs Assessment: post-procedure vital signs reviewed and stable ?Respiratory status: spontaneous breathing, nonlabored ventilation and respiratory function stable ?Cardiovascular status: blood pressure returned to baseline and stable ?Postop Assessment: no apparent nausea or vomiting ?Anesthetic complications: no ? ? ?No notable events documented. ? ?Last Vitals:  ?Vitals:  ? 07/28/21 1515 07/28/21 1535  ?BP: 116/75 134/76  ?Pulse: 95 91  ?Resp: 13 16  ?Temp:  (!) 36.4 ?C  ?SpO2: 95% 97%  ?  ?Last Pain:  ?Vitals:  ? 07/28/21 1535  ?PainSc: 0-No pain  ? ? ?  ?  ?  ?  ?  ?  ? ?Lidia Collum ? ? ? ? ?

## 2021-07-28 NOTE — Progress Notes (Signed)
Dr Donne Hazel injected pt with magtrace. Left breast. Pt tol well. ?

## 2021-07-28 NOTE — Interval H&P Note (Signed)
History and Physical Interval Note: ? ?07/28/2021 ?1:18 PM ? ?Elizabeth Cummings  has presented today for surgery, with the diagnosis of LEFT BREAST CANCER.  The various methods of treatment have been discussed with the patient and family. After consideration of risks, benefits and other options for treatment, the patient has consented to  Procedure(s): ?LEFT BREAST BRACKETED LUMPECTOMY WITH RADIOACTIVE SEED AND AXILLARY SENTINEL LYMPH NODE BIOPSY (Left) as a surgical intervention.  The patient's history has been reviewed, patient examined, no change in status, stable for surgery.  I have reviewed the patient's chart and labs.  Questions were answered to the patient's satisfaction.   ? ? ?Rolm Bookbinder ? ? ?

## 2021-07-28 NOTE — Progress Notes (Signed)
Assisted Dr. Christella Hartigan with left, pectoralis, ultrasound guided block. Side rails up, monitors on throughout procedure. See vital signs in flow sheet. Tolerated Procedure well. ?

## 2021-07-28 NOTE — Op Note (Signed)
Preoperative diagnosis: Clinical stage II left breast cancer ?Postoperative diagnosis: Same as above ?Procedure: ?1.  Left breast radioactive seed guided lumpectomy ?2.  Left deep axillary sentinel lymph node biopsy ?3.  Injection of mag trace for sentinel lymph node identification ?Surgeon: Dr. Serita Grammes ?Anesthesia: General with a pectoral block ?Estimated blood loss: Minimal ?Complications: None ?Drains: None ?Specimens: ?1.  Left breast lumpectomy containing seeds and clips ? 2.  Left deep axillary sentinel lymph nodes with highest count of 9000 ?3. Additional left breast margins superior, lateral, medial and inferior marked short superior, long lateral double deep ?Sponge needle count was correct at completion ?Decision to recovery stable condition ? 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 The right side apparently was negative. The left side had a subtle distortion in the outer left breast. This was recalled and underwent a biopsy. There was no ultrasound correlate. Her lymph nodes were also normal by ultrasound. Biopsy shows a grade 1 invasive lobular carcinoma that is 90% ER positive, 80% PR positive, HER2 negative and proliferation index was 4%. She underwent mri with 2.7 cm index lesion and then a 1.2 cm satellite next to it.  She had another biopsy to mark this for excision that is also positive.  ? ?Procedure: She first had seeds  placed by radiology.  I had these mammograms available for my review.  After informed consent was obtained she then underwent a pectoral block.  I also prepped the area lateral to the areola.  I injected 2 cc of mag trace in the subareolar position without complication.  She was given antibiotics.  SCDs were placed.  She was then placed under general anesthesia without complication.  She was prepped and draped in the standard sterile surgical fashion.  A surgical timeout was then performed. ? ?I made a  curvilinear incision above the seeds as they were both quite close to the skin.  I then dissected to the seeds. I removed the seeds and the surrounding tissue with an attempt to get a clear margin. Mammogram confirmed removal of the seeds and the clips. I did remove additional margins as I thought they might be close on the 3D imagen. Hemostasis was observed. I closed this with 2-0 vicryl for the breast tissue. I then closed skin with 3-0 vicryl and 5-0 monocryl. Glue was placed.   ? ?I made a low axillary incision.I dissected through the axillary fascia.  I used the probe to identify what appeared to be several normal-appearing nodes that contained magtrace.  There was no other activity once I was completed this.  These were then passed off the table.  Hemostasis was observed.  I closed the breast tissue with 2-0 vicryl as well.  I then closed this with 2-0 Vicryl, 3-0 Vicryl, and 4-0 Monocryl. Glue and Steri-Strips were applied.  she tolerated well, was extubated and transferred to recovery stable ?

## 2021-07-28 NOTE — Interval H&P Note (Signed)
History and Physical Interval Note: ? ?07/28/2021 ?11:30 AM ? ?Elizabeth Cummings  has presented today for surgery, with the diagnosis of LEFT BREAST CANCER.  The various methods of treatment have been discussed with the patient and family. After consideration of risks, benefits and other options for treatment, the patient has consented to  Procedure(s): ?LEFT BREAST BRACKETED LUMPECTOMY WITH RADIOACTIVE SEED AND AXILLARY SENTINEL LYMPH NODE BIOPSY (Left) as a surgical intervention.  The patient's history has been reviewed, patient examined, no change in status, stable for surgery.  I have reviewed the patient's chart and labs.  Questions were answered to the patient's satisfaction.   ? ? ?Rolm Bookbinder ? ? ?

## 2021-07-28 NOTE — Anesthesia Procedure Notes (Signed)
Procedure Name: LMA Insertion ?Date/Time: 07/28/2021 1:36 PM ?Performed by: Verita Lamb, CRNA ?Pre-anesthesia Checklist: Patient identified, Emergency Drugs available, Suction available and Patient being monitored ?Patient Re-evaluated:Patient Re-evaluated prior to induction ?Oxygen Delivery Method: Circle System Utilized ?Preoxygenation: Pre-oxygenation with 100% oxygen ?Induction Type: IV induction ?Ventilation: Mask ventilation without difficulty ?LMA: LMA inserted ?LMA Size: 4.0 ?Number of attempts: 1 ?Airway Equipment and Method: Bite block ?Placement Confirmation: positive ETCO2 ?Tube secured with: Tape ?Dental Injury: Teeth and Oropharynx as per pre-operative assessment  ? ? ? ? ?

## 2021-07-29 ENCOUNTER — Other Ambulatory Visit (HOSPITAL_COMMUNITY): Payer: Self-pay

## 2021-07-29 ENCOUNTER — Encounter (HOSPITAL_BASED_OUTPATIENT_CLINIC_OR_DEPARTMENT_OTHER): Payer: Self-pay | Admitting: General Surgery

## 2021-07-29 ENCOUNTER — Telehealth (INDEPENDENT_AMBULATORY_CARE_PROVIDER_SITE_OTHER): Payer: 59 | Admitting: Family Medicine

## 2021-07-29 ENCOUNTER — Encounter: Payer: Self-pay | Admitting: *Deleted

## 2021-07-29 ENCOUNTER — Ambulatory Visit: Payer: Self-pay | Admitting: Licensed Clinical Social Worker

## 2021-07-29 ENCOUNTER — Encounter (HOSPITAL_COMMUNITY): Payer: Self-pay

## 2021-07-29 DIAGNOSIS — Z1379 Encounter for other screening for genetic and chromosomal anomalies: Secondary | ICD-10-CM

## 2021-07-29 MED ORDER — PRAVASTATIN SODIUM 20 MG PO TABS
20.0000 mg | ORAL_TABLET | ORAL | 0 refills | Status: DC
  Filled 2021-07-29: qty 24, 84d supply, fill #0
  Filled 2021-10-29: qty 24, 84d supply, fill #1

## 2021-07-29 NOTE — Progress Notes (Signed)
?TeleHealth Visit:  ?This visit was completed with telemedicine (audio/video) technology. ?Elizabeth Cummings has verbally consented to this TeleHealth visit. The patient is located at home, the provider is located at home. The participants in this visit include the listed provider and patient. The visit was conducted today via MyChart video. ? ?OBESITY ?Elizabeth Cummings is here to discuss her progress with her obesity treatment plan along with follow-up of her obesity related diagnoses.  ? ?Today's visit was # 10 ?Starting weight: 231 lbs ?Starting date: 11/14/20 ?Weight reported at last virtual office visit: 202.8 on 07/01/21 ?Today's reported weight: 204.2 lbs  ?Weight change since last visit: +1 ? ? ?Nutrition Plan: keeping a food journal and adhering to recommended goals of 1200-1300 calories and 90 gms protein.  ?Current exercise: none ? ?Interim History: Elizabeth Cummings was diagnosed with left breast cancer and is status post left lumpectomy and sentinel node extraction on May 8.  She is recovering well.  She has been focusing on getting in adequate protein.  She has not been journaling over the past several weeks since her diagnosis. ? ?Assessment/Plan:  ?1. Prediabetes ?Elizabeth Cummings has a diagnosis of prediabetes based on her elevated HgA1c. ?She denies polyphagia. ?Medication(s): Mounjaro 12.5 mg weekly.  Appetite well controlled. ?Lab Results  ?Component Value Date  ? HGBA1C 6.3 (H) 06/12/2021  ? ?Lab Results  ?Component Value Date  ? INSULIN 20.3 11/14/2020  ? ? ?Plan: ?Continue Mounjaro 12.5 mg weekly. ? ?Obesity: Current BMI 36.15 ?Elizabeth Cummings is currently in the action stage of change. As such, her goal is to continue with weight loss efforts.  ?She has agreed to  keeping a food journal and adhering to recommended goals of 1200-1300 calories and 90 gms protein.  ? ?Exercise goals: No exercise has been prescribed at this time. ? ?Behavioral modification strategies: increasing lean protein intake and decreasing simple  carbohydrates. ?She will resume journaling within the next few weeks. ? ?Elizabeth Cummings has agreed to follow-up with our clinic in 4 weeks.  ? ?No orders of the defined types were placed in this encounter. ? ? ?There are no discontinued medications.  ? ?No orders of the defined types were placed in this encounter. ?   ? ?Objective:  ? ?VITALS: Per patient if applicable, see vitals. ?GENERAL: Alert and in no acute distress. ?CARDIOPULMONARY: No increased WOB. Speaking in clear sentences.  ?PSYCH: Pleasant and cooperative. Speech normal rate and rhythm. Affect is appropriate. Insight and judgement are appropriate. Attention is focused, linear, and appropriate.  ?NEURO: Oriented as arrived to appointment on time with no prompting.  ? ?Lab Results  ?Component Value Date  ? CREATININE 0.70 07/25/2021  ? BUN 10 07/25/2021  ? NA 138 07/25/2021  ? K 3.3 (L) 07/25/2021  ? CL 101 07/25/2021  ? CO2 27 07/25/2021  ? ?Lab Results  ?Component Value Date  ? ALT 17 03/14/2021  ? AST 17 03/14/2021  ? ALKPHOS 97 03/14/2021  ? BILITOT 0.3 11/14/2020  ? ?Lab Results  ?Component Value Date  ? HGBA1C 6.3 (H) 06/12/2021  ? HGBA1C 6.6 03/14/2021  ? HGBA1C 6.4 (H) 11/14/2020  ? HGBA1C 6.4 12/27/2014  ? HGBA1C 6.3 09/06/2014  ? ?Lab Results  ?Component Value Date  ? INSULIN 20.3 11/14/2020  ? ?Lab Results  ?Component Value Date  ? TSH 0.785 11/14/2020  ? ?Lab Results  ?Component Value Date  ? CHOL 156 03/14/2021  ? HDL 49 03/14/2021  ? [place] 86 03/14/2021  ? TRIG 121 03/14/2021  ? ?Lab Results  ?Component Value  Date  ? WBC 13.2 03/14/2021  ? HGB 12.7 03/14/2021  ? HCT 38 03/14/2021  ? MCV 83 11/14/2020  ? PLT 419 (A) 03/14/2021  ? ?Lab Results  ?Component Value Date  ? IRON 58 05/08/2014  ? TIBC 506 (H) 05/08/2014  ? FERRITIN 36 05/08/2014  ? ?Lab Results  ?Component Value Date  ? VD25OH 50.3 06/12/2021  ? VD25OH 50.9 11/14/2020  ? ? ?Attestation Statements:  ? ?Reviewed by clinician on day of visit: allergies, medications, problem list, medical  history, surgical history, family history, social history, and previous encounter notes. ? ?

## 2021-07-29 NOTE — Progress Notes (Signed)
HPI:  Elizabeth Cummings was previously seen in the [place] clinic due to a personal and family history of cancer and concerns regarding a hereditary predisposition to cancer. Please refer to our prior cancer genetics clinic note for more information regarding our discussion, assessment and recommendations, at the time. Elizabeth Cummings recent genetic test results were disclosed to her, as were recommendations warranted by these results. These results and recommendations are discussed in more detail below. ? ?CANCER HISTORY:  ?Oncology History  ?Malignant neoplasm of upper-outer quadrant of left breast in female, estrogen receptor positive ([place])  ?06/27/2021 Initial Diagnosis  ? Screening mammogram detected left breast asymmetry and distortion.  No ultrasound correlate.  Axilla negative, stereotactic biopsy showed grade 1 invasive lobular cancer with LCIS, negative for lymphovascular invasion, ER 90%, PR 80%, Ki-67 4%, HER2 equivocal by IHC, FISH negative, ratio 1.21 ?  ?07/10/2021 Cancer Staging  ? Staging form: Breast, AJCC 8th Edition ?- Clinical: Stage Unknown (cTX, cN0, cM0, G1, ER+, PR+, HER2-) - Signed by Nicholas Lose, MD on 07/10/2021 ?Stage prefix: Initial diagnosis ?Histologic grading system: 3 grade system ? ?  ? Genetic Testing  ? Negative genetic testing. No pathogenic variants identified on the [facility]+ CancerNext-Expanded+RNA panel. VUS in KIF1B called c.5177C>G identified. The report date is 07/25/2021. ? ?The CancerNext-Expanded + RNAinsight gene panel offered by [employer] and includes sequencing and rearrangement analysis for the following 77 genes: IP, ALK, APC*, ATM*, AXIN2, BAP1, BARD1, BLM, BMPR1A, BRCA1*, BRCA2*, BRIP1*, CDC73, CDH1*,CDK4, CDKN1B, CDKN2A, CHEK2*, CTNNA1, DICER1, FANCC, FH, FLCN, GALNT12, KIF1B, LZTR1, MAX, MEN1, MET, MLH1*, MSH2*, MSH3, MSH6*, MUTYH*, NBN, NF1*, NF2, NTHL1, PALB2*, PHOX2B, PMS2*, POT1, PRKAR1A, PTCH1, PTEN*, RAD51C*, RAD51D*,RB1, RECQL, RET,  SDHA, SDHAF2, SDHB, SDHC, SDHD, SMAD4, SMARCA4, SMARCB1, SMARCE1, STK11, SUFU, TMEM127, TP53*,TSC1, TSC2, VHL and XRCC2 (sequencing and deletion/duplication); EGFR, EGLN1, HOXB13, KIT, MITF, PDGFRA, POLD1 and POLE (sequencing only); EPCAM and GREM1 (deletion/duplication only). ?  ? ? ?FAMILY HISTORY:  ?We obtained a detailed, 4-generation family history.  Significant diagnoses are listed below: ?Family History  ?Problem Relation Age of Onset  ? Diabetes Mother   ? High blood pressure Mother   ? High Cholesterol Mother   ? Stroke Mother   ? Obesity Mother   ? High blood pressure Father   ? High Cholesterol Father   ? Alcoholism Father   ? Obesity Father   ? Colon cancer Maternal Aunt   ? Other Maternal Aunt   ?     brain tumor  ? Bladder Cancer Maternal Uncle   ?     dx 37s  ? Kidney cancer Paternal Grandmother   ?     dx 30s  ? Lung cancer Paternal Grandfather   ? Hypertension Other   ? Hyperlipidemia Other   ? Cancer Other   ? Stroke Other   ? Diabetes Other   ? Pancreatic cancer Other   ? ? ?Elizabeth Cummings has 1 son, 40 and 1 daughter, 43. She has 1 brother. ?  ?Elizabeth Cummings's mother is living at 80 and has history of skin cancer (basal cell) on her face. Patient had 5 maternal aunts, 4 uncles. One aunt had colon cancer diagnosed over the age of 80. Another aunt had a brain tumor, unsure if cancer. Maternal uncle had bladder cancer in his 31s. No known cancers in cousins. Maternal grandfather died at 58 and had a sister who died of pancreatic cancer. Maternal grandmother died over age 45. ?  ?Elizabeth Cummings's father  is living at 22. Patient has 2 paternal uncles, no cancers. Paternal grandmother had kidney cancer. Grandfather had lung cancer.  ?  ?Elizabeth Cummings is unaware of previous family history of genetic testing for hereditary cancer risks. Patient's maternal ancestors are of unk descent, and paternal ancestors are of unk descent. There is no reported Ashkenazi Jewish ancestry. There is no known consanguinity. ?   ? ? ?GENETIC TEST RESULTS: Genetic testing reported out on 07/25/2021 through the [facility] +CancerNext-Expanded+RNA cancer panel found no pathogenic mutations.  ? ?The CancerNext-Expanded + RNAinsight gene panel offered by [employer] and includes sequencing and rearrangement analysis for the following 77 genes: IP, ALK, APC*, ATM*, AXIN2, BAP1, BARD1, BLM, BMPR1A, BRCA1*, BRCA2*, BRIP1*, CDC73, CDH1*,CDK4, CDKN1B, CDKN2A, CHEK2*, CTNNA1, DICER1, FANCC, FH, FLCN, GALNT12, KIF1B, LZTR1, MAX, MEN1, MET, MLH1*, MSH2*, MSH3, MSH6*, MUTYH*, NBN, NF1*, NF2, NTHL1, PALB2*, PHOX2B, PMS2*, POT1, PRKAR1A, PTCH1, PTEN*, RAD51C*, RAD51D*,RB1, RECQL, RET, SDHA, SDHAF2, SDHB, SDHC, SDHD, SMAD4, SMARCA4, SMARCB1, SMARCE1, STK11, SUFU, TMEM127, TP53*,TSC1, TSC2, VHL and XRCC2 (sequencing and deletion/duplication); EGFR, EGLN1, HOXB13, KIT, MITF, PDGFRA, POLD1 and POLE (sequencing only); EPCAM and GREM1 (deletion/duplication only). ? ?The test report has been scanned into EPIC and is located under the Molecular Pathology section of the Results Review tab.  A portion of the result report is included below for reference.  ? ? ?We discussed that because current genetic testing is not perfect, it is possible there may be a gene mutation in one of these genes that current testing cannot detect, but that chance is small.  There could be another gene that has not yet been discovered, or that we have not yet tested, that is responsible for the cancer diagnoses in the family. It is also possible there is a hereditary cause for the cancer in the family that Elizabeth Cummings did not inherit and therefore was not identified in her testing.  Therefore, it is important to remain in touch with cancer genetics in the future so that we can continue to offer Elizabeth Cummings the most up to date genetic testing.  ? ?Genetic testing did identify a variant of uncertain significance (VUS) in the KIF1B gene called c.5177C>G.  At this time, it is unknown if  this variant is associated with increased cancer risk or if this is a normal finding, but most variants such as this get reclassified to being inconsequential. It should not be used to make medical management decisions. With time, we suspect the lab will determine the significance of this variant, if any. If we do learn more about it we will try to contact Elizabeth Cummings to discuss it further. However, it is important to stay in touch with [place] periodically and keep the address and phone number up to date. ? ?ADDITIONAL GENETIC TESTING: We discussed with Elizabeth Cummings that her genetic testing was fairly extensive.  If there are genes identified to increase cancer risk that can be analyzed in the future, we would be happy to discuss and coordinate this testing at that time.   ? ?CANCER SCREENING RECOMMENDATIONS: Elizabeth Cummings test result is considered negative (normal).  This means that we have not identified a hereditary cause for her  personal and family history of cancer at this time. Most cancers happen by chance and this negative test suggests that her cancer may fall into this category.   ? ?While reassuring, this does not definitively rule out a hereditary predisposition to cancer. It is still possible that there could be genetic mutations  that are undetectable by current technology. There could be genetic mutations in genes that have not been tested or identified to increase cancer risk.  Therefore, it is recommended she continue to follow the cancer management and screening guidelines provided by her oncology and primary healthcare provider.  ? ?An individual's cancer risk and medical management are not determined by genetic test results alone. Overall cancer risk assessment incorporates additional factors, including personal medical history, family history, and any available genetic information that may result in a personalized plan for cancer prevention and surveillance. ? ?RECOMMENDATIONS FOR FAMILY MEMBERS:   Relatives in this family might be at some increased risk of developing cancer, over the general population risk, simply due to the family history of cancer.  We recommended female relatives in this family have a yearly m

## 2021-07-31 ENCOUNTER — Telehealth (INDEPENDENT_AMBULATORY_CARE_PROVIDER_SITE_OTHER): Payer: 59 | Admitting: Family Medicine

## 2021-07-31 ENCOUNTER — Encounter (INDEPENDENT_AMBULATORY_CARE_PROVIDER_SITE_OTHER): Payer: Self-pay | Admitting: Family Medicine

## 2021-07-31 ENCOUNTER — Encounter (INDEPENDENT_AMBULATORY_CARE_PROVIDER_SITE_OTHER): Payer: Self-pay

## 2021-07-31 VITALS — Ht 63.0 in | Wt 204.0 lb

## 2021-07-31 DIAGNOSIS — R7303 Prediabetes: Secondary | ICD-10-CM

## 2021-07-31 DIAGNOSIS — E669 Obesity, unspecified: Secondary | ICD-10-CM | POA: Diagnosis not present

## 2021-07-31 DIAGNOSIS — Z6836 Body mass index (BMI) 36.0-36.9, adult: Secondary | ICD-10-CM | POA: Diagnosis not present

## 2021-07-31 DIAGNOSIS — E1169 Type 2 diabetes mellitus with other specified complication: Secondary | ICD-10-CM

## 2021-07-31 LAB — SURGICAL PATHOLOGY

## 2021-08-04 ENCOUNTER — Encounter: Payer: Self-pay | Admitting: *Deleted

## 2021-08-04 ENCOUNTER — Telehealth: Payer: Self-pay | Admitting: *Deleted

## 2021-08-04 ENCOUNTER — Ambulatory Visit: Payer: 59 | Admitting: Hematology and Oncology

## 2021-08-04 ENCOUNTER — Encounter (INDEPENDENT_AMBULATORY_CARE_PROVIDER_SITE_OTHER): Payer: Self-pay | Admitting: Family Medicine

## 2021-08-04 NOTE — Telephone Encounter (Signed)
Received order for oncotype testing. Requisition faxed to pathology and [facility] °

## 2021-08-04 NOTE — Telephone Encounter (Signed)
Closing note 

## 2021-08-05 ENCOUNTER — Inpatient Hospital Stay (HOSPITAL_BASED_OUTPATIENT_CLINIC_OR_DEPARTMENT_OTHER): Payer: 59 | Admitting: Hematology and Oncology

## 2021-08-05 ENCOUNTER — Inpatient Hospital Stay: Payer: 59 | Attending: Hematology and Oncology

## 2021-08-05 ENCOUNTER — Other Ambulatory Visit: Payer: Self-pay

## 2021-08-05 DIAGNOSIS — Z170 Estrogen receptor positive status [ER+]: Secondary | ICD-10-CM | POA: Diagnosis not present

## 2021-08-05 DIAGNOSIS — C50412 Malignant neoplasm of upper-outer quadrant of left female breast: Secondary | ICD-10-CM | POA: Insufficient documentation

## 2021-08-05 DIAGNOSIS — Z79899 Other long term (current) drug therapy: Secondary | ICD-10-CM | POA: Diagnosis not present

## 2021-08-05 NOTE — Progress Notes (Signed)
? ?Patient Care Team: ?Mayra Neer, MD as PCP - General (Family Medicine) ?Bensimhon, Shaune Pascal, MD as Consulting Physician (Cardiology) ?Tiajuana Amass, MD as Referring Physician (Allergy and Immunology) ?Mauro Kaufmann, RN as Oncology Nurse Navigator ?Rockwell Germany, RN as Oncology Nurse Navigator ?Nicholas Lose, MD as Consulting Physician (Hematology and Oncology) ? ?DIAGNOSIS:  ?Encounter Diagnosis  ?Name Primary?  ? Malignant neoplasm of upper-outer quadrant of left breast in female, estrogen receptor positive ([place])   ? ? ?SUMMARY OF ONCOLOGIC HISTORY: ?Oncology History  ?Malignant neoplasm of upper-outer quadrant of left breast in female, estrogen receptor positive ([place])  ?06/27/2021 Initial Diagnosis  ? Screening mammogram detected left breast asymmetry and distortion.  No ultrasound correlate.  Axilla negative, stereotactic biopsy showed grade 1 invasive lobular cancer with LCIS, negative for lymphovascular invasion, ER 90%, PR 80%, Ki-67 4%, HER2 equivocal by IHC, FISH negative, ratio 1.21 ?  ?07/10/2021 Cancer Staging  ? Staging form: Breast, AJCC 8th Edition ?- Clinical: Stage Unknown (cTX, cN0, cM0, G1, ER+, PR+, HER2-) - Signed by Nicholas Lose, MD on 07/10/2021 ?Stage prefix: Initial diagnosis ?Histologic grading system: 3 grade system ? ?  ? Genetic Testing  ? Negative genetic testing. No pathogenic variants identified on the [facility] CancerNext-Expanded+RNA panel. VUS in KIF1B called c.5177C>G identified. The report date is 07/25/2021. ? ?The CancerNext-Expanded + RNAinsight gene panel offered by [employer] and includes sequencing and rearrangement analysis for the following 77 genes: IP, ALK, APC*, ATM*, AXIN2, BAP1, BARD1, BLM, BMPR1A, BRCA1*, BRCA2*, BRIP1*, CDC73, CDH1*,CDK4, CDKN1B, CDKN2A, CHEK2*, CTNNA1, DICER1, FANCC, FH, FLCN, GALNT12, KIF1B, LZTR1, MAX, MEN1, MET, MLH1*, MSH2*, MSH3, MSH6*, MUTYH*, NBN, NF1*, NF2, NTHL1, PALB2*, PHOX2B, PMS2*, POT1, PRKAR1A, PTCH1, PTEN*, RAD51C*,  RAD51D*,RB1, RECQL, RET, SDHA, SDHAF2, SDHB, SDHC, SDHD, SMAD4, SMARCA4, SMARCB1, SMARCE1, STK11, SUFU, TMEM127, TP53*,TSC1, TSC2, VHL and XRCC2 (sequencing and deletion/duplication); EGFR, EGLN1, HOXB13, KIT, MITF, PDGFRA, POLD1 and POLE (sequencing only); EPCAM and GREM1 (deletion/duplication only). ?  ? ? ?CHIEF COMPLIANT: Follow-up after surgery ? ?INTERVAL HISTORY: Elizabeth Cummings is a 73-year with above-mentioned history of left breast cancer underwent lumpectomy and is here today to discuss results.  She is healing and recovering very well from recent surgery. ? ? ?ALLERGIES:  is allergic to gluten meal and dilaudid [hydromorphone hcl]. ? ?MEDICATIONS:  ?Current Outpatient Medications  ?Medication Sig Dispense Refill  ? azelastine (ASTELIN) 0.1 % nasal spray Place 1-2 sprays into both nostrils 2 (two) times daily. 30 mL 5  ? calcium-vitamin D (OSCAL WITH D) 500-200 MG-UNIT per tablet Take 1 tablet by mouth daily.    ? [employer] FIBER GUMMIES PO Take 2 Units by mouth daily.    ? fexofenadine (ALLEGRA) 180 MG tablet Take 1 tablet Orally Once a day in the morning. (please fill with levocetirizine) 90 tablet 3  ? fluticasone (FLONASE) 50 MCG/ACT nasal spray Place 2 sprays into both nostrils daily. 16 g 6  ? hydrochlorothiazide (HYDRODIURIL) 25 MG tablet Take 1 tablet (25 mg total) by mouth daily for edema. 90 tablet 3  ? levocetirizine (XYZAL) 5 MG tablet Take 1 tablet (5 mg total) by mouth every evening. 90 tablet 3  ? levonorgestrel (MIRENA, 52 MG,) 20 MCG/DAY IUD 1 Intra Uterine Device by Intrauterine route once.    ? Multiple Vitamin (MULTIVITAMIN) tablet Take 1 tablet by mouth daily.    ? oxyCODONE (OXY IR/ROXICODONE) 5 MG immediate release tablet Take 1 tablet (5 mg total) by mouth every 6 (six) hours as needed. 10 tablet 0  ?  pravastatin (PRAVACHOL) 20 MG tablet Take 1 tablet (20 mg total) by mouth 2 (two) times a week on Monday and thursday 90 tablet 0  ? Probiotic Product (DIGESTIVE ADV DIGESTIVE/IMMUNE PO)  Take 1 Units by mouth daily.    ? tirzepatide (MOUNJARO) 12.5 MG/0.5ML Pen Inject 12.5 mg into the skin once a week. 2 mL 2  ? ?No current facility-administered medications for this visit.  ? ? ?PHYSICAL EXAMINATION: ?ECOG PERFORMANCE STATUS: 1 - Symptomatic but completely ambulatory ? ?Vitals:  ? 08/05/21 1031  ?BP: 114/85  ?Pulse: 67  ?Resp: 18  ?Temp: 97.8 ?F (36.6 ?C)  ?SpO2: 99%  ? ?Filed Weights  ? 08/05/21 1031  ?Weight: 204 lb (92.5 kg)  ? ?  ? ?LABORATORY DATA:  ?I have reviewed the data as listed ? ?  Latest Ref Rng & Units 07/25/2021  ?  8:44 AM 03/14/2021  ? 12:00 AM 11/14/2020  ?  3:29 PM  ?CMP  ?Glucose 70 - 99 mg/dL 93    82    ?BUN 6 - 20 mg/dL 10   10      11     ?Creatinine 0.44 - 1.00 mg/dL 0.70   0.7      0.66    ?Sodium 135 - 145 mmol/L 138   139      141    ?Potassium 3.5 - 5.1 mmol/L 3.3   3.6      4.0    ?Chloride 98 - 111 mmol/L 101   99      100    ?CO2 22 - 32 mmol/L 27   28      21     ?Calcium 8.9 - 10.3 mg/dL 9.3   9.6      9.6    ?Total Protein 6.0 - 8.5 g/dL   7.4    ?Total Bilirubin 0.0 - 1.2 mg/dL   0.3    ?Alkaline Phos 25 - 125  97      111    ?AST 13 - 35  17      38    ?ALT 7 - 35  17      22    ?  ? This result is from an external source.  ? ? ?Lab Results  ?Component Value Date  ? WBC 13.2 03/14/2021  ? HGB 12.7 03/14/2021  ? HCT 38 03/14/2021  ? MCV 83 11/14/2020  ? PLT 419 (A) 03/14/2021  ? NEUTROABS 7.3 (H) 11/14/2020  ? ? ?ASSESSMENT & PLAN:  ?Malignant neoplasm of upper-outer quadrant of left breast in female, estrogen receptor positive ([place]) ?07/28/2021: Left lumpectomy: Grade 2 ILC 3.5 cm with LCIS, intraductal papilloma with UBH, margins negative, 1 lymph node with isolated tumor cells, ER 90%, PR 80%, HER2 negative, Ki-67 4% ? ?Pathology counseling: I discussed the final pathology report of the patient provided  a copy of this report. I discussed the margins as well as lymph node surgeries. We also discussed the final staging along with previously performed ER/PR and  HER-2/neu testing. ? ?Current treatment: ?1.  Oncotype DX testing to determine if she would benefit from chemotherapy ?2. adjuvant radiation ?3.  Followed by adjuvant antiestrogen therapy ? 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 ?Return to clinic based on Oncotype DX score ? ? ?No orders of the defined types were placed in this encounter. ? ?The patient has a good understanding of the overall plan. she agrees with it. she will call with any problems that  may develop before the next visit here. ?Total time spent: 30 mins including face to face time and time spent for planning, charting and co-ordination of care ? ? Harriette Ohara, MD ?08/05/21 ? ? ? ?

## 2021-08-05 NOTE — Assessment & Plan Note (Signed)
07/28/2021: Left lumpectomy: Grade 2 ILC 3.5 cm with LCIS, intraductal papilloma with UBH, margins negative, 1 lymph node with isolated tumor cells, ER 90%, PR 80%, HER2 negative, Ki-67 4% ? ?Pathology counseling: I discussed the final pathology report of the patient provided  a copy of this report. I discussed the margins as well as lymph node surgeries. We also discussed the final staging along with previously performed ER/PR and HER-2/neu testing. ? ?Current treatment: ?1.  Oncotype DX testing to determine if she would benefit from chemotherapy ?2. adjuvant radiation ?3.  Followed by adjuvant antiestrogen therapy ? 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 ?Return to clinic after radiation is complete ?

## 2021-08-06 ENCOUNTER — Inpatient Hospital Stay: Payer: 59 | Admitting: Hematology and Oncology

## 2021-08-06 LAB — FOLLICLE STIMULATING HORMONE: FSH: 24.4 m[IU]/mL

## 2021-08-07 DIAGNOSIS — Z30431 Encounter for routine checking of intrauterine contraceptive device: Secondary | ICD-10-CM | POA: Diagnosis not present

## 2021-08-08 ENCOUNTER — Other Ambulatory Visit (HOSPITAL_COMMUNITY): Payer: Self-pay

## 2021-08-08 ENCOUNTER — Telehealth: Payer: 59 | Admitting: Emergency Medicine

## 2021-08-08 DIAGNOSIS — N3000 Acute cystitis without hematuria: Secondary | ICD-10-CM | POA: Diagnosis not present

## 2021-08-08 LAB — ESTRADIOL, ULTRA SENS: Estradiol, Sensitive: 20 pg/mL

## 2021-08-08 MED ORDER — CEPHALEXIN 500 MG PO CAPS
500.0000 mg | ORAL_CAPSULE | Freq: Two times a day (BID) | ORAL | 0 refills | Status: DC
  Filled 2021-08-08: qty 14, 7d supply, fill #0

## 2021-08-08 NOTE — Progress Notes (Signed)
E-Visit for Urinary Problems ? ?We are sorry that you are not feeling well.  Here is how we plan to help! ? ?Based on what you shared with me it looks like you most likely have a simple urinary tract infection. ? ?A UTI (Urinary Tract Infection) is a bacterial infection of the bladder. ? ?Most cases of urinary tract infections are simple to treat but a key part of your care is to encourage you to drink plenty of fluids and watch your symptoms carefully. ? ?I have prescribed Keflex 500 mg twice a day for 7 days.  Your symptoms should gradually improve. Call us if the burning in your urine worsens, you develop worsening fever, back pain or pelvic pain or if your symptoms do not resolve after completing the antibiotic. ? ?Urinary tract infections can be prevented by drinking plenty of water to keep your body hydrated.  Also be sure when you wipe, wipe from front to back and don't hold it in!  If possible, empty your bladder every 4 hours. ? ?HOME CARE ?Drink plenty of fluids ?Compete the full course of the antibiotics even if the symptoms resolve ?Remember, when you need to go?go. Holding in your urine can increase the likelihood of getting a UTI! ?GET HELP RIGHT AWAY IF: ?You cannot urinate ?You get a high fever ?Worsening back pain occurs ?You see blood in your urine ?You feel sick to your stomach or throw up ?You feel like you are going to pass out ? ?MAKE SURE YOU  ?Understand these instructions. ?Will watch your condition. ?Will get help right away if you are not doing well or get worse. ? ? ?Thank you for choosing an e-visit. ? ?Your e-visit answers were reviewed by a board certified advanced clinical practitioner to complete your personal care plan. Depending upon the condition, your plan could have included both over the counter or prescription medications. ? ?Please review your pharmacy choice. Make sure the pharmacy is open so you can pick up prescription now. If there is a problem, you may contact your  provider through MyChart messaging and have the prescription routed to another pharmacy.  Your safety is important to us. If you have drug allergies check your prescription carefully.  ? ?For the next 24 hours you can use MyChart to ask questions about today's visit, request a non-urgent call back, or ask for a work or school excuse. ?You will get an email in the next two days asking about your experience. I hope that your e-visit has been valuable and will speed your recovery. ? ?I have spent 5 minutes in review of e-visit questionnaire, review and updating patient chart, medical decision making and response to patient.  ? ?Kameisha Malicki, PhD, FNP-BC ?  ?

## 2021-08-12 DIAGNOSIS — Z170 Estrogen receptor positive status [ER+]: Secondary | ICD-10-CM | POA: Diagnosis not present

## 2021-08-12 DIAGNOSIS — C50412 Malignant neoplasm of upper-outer quadrant of left female breast: Secondary | ICD-10-CM | POA: Diagnosis not present

## 2021-08-19 ENCOUNTER — Encounter: Payer: Self-pay | Admitting: *Deleted

## 2021-08-19 ENCOUNTER — Telehealth: Payer: Self-pay | Admitting: *Deleted

## 2021-08-19 ENCOUNTER — Telehealth: Payer: Self-pay

## 2021-08-19 ENCOUNTER — Encounter: Payer: Self-pay | Admitting: Hematology and Oncology

## 2021-08-19 DIAGNOSIS — C50412 Malignant neoplasm of upper-outer quadrant of left female breast: Secondary | ICD-10-CM

## 2021-08-19 NOTE — Telephone Encounter (Signed)
Notified Patient of completion of FMLA paperwork. Fax transmission confirmation received. Copy of forms emailed to patient as requested. No other needs or concerns voiced at this time.

## 2021-08-19 NOTE — Telephone Encounter (Signed)
Received Oncotype Score of 14. Physician team notified. Called pt with results. Informed chemo not recommended and next step is XRT. Received verbal understanding. Referral placed for Dr. Isidore Moos. No further needs voiced at this time.

## 2021-08-20 NOTE — Therapy (Signed)
OUTPATIENT PHYSICAL THERAPY BREAST CANCER POST OP FOLLOW UP   Patient Name: Elizabeth Cummings MRN: [id number] DOB:07-29-1975, 46 y.o., female Today's Date: 08/21/2021    Past Medical History:  Diagnosis Date   Allergy    seasonal, dogs, cats   Angioneurotic edema 05/08/2014   Cancer ([place]) 07/24/2021   Breast   Diabetes mellitus without complication ([place])    Leukocytosis 05/08/2014   Microcytic anemia 05/08/2014   Thrombocytosis 05/08/2014   Past Surgical History:  Procedure Laterality Date   APPENDECTOMY     BREAST BIOPSY Left 07/24/2021   MRI   BREAST BIOPSY Left 06/27/2021   U/S   BREAST LUMPECTOMY WITH RADIOACTIVE SEED AND SENTINEL LYMPH NODE BIOPSY Left 07/28/2021   Procedure: LEFT BREAST BRACKETED LUMPECTOMY WITH RADIOACTIVE SEED AND AXILLARY SENTINEL LYMPH NODE BIOPSY;  Surgeon: Rolm Bookbinder, MD;  Location: [facility];  Service: General;  Laterality: Left;   [place]     WRIST SURGERY     Patient Active Problem List   Diagnosis Date Noted   Genetic testing 07/25/2021   Malignant neoplasm of upper-outer quadrant of left breast in female, estrogen receptor positive ([place]) 07/10/2021   Hypertension associated with diabetes ([place]) 07/01/2021   Migraine without status migrainosus, not intractable 04/02/2021   Hyperlipidemia associated with type 2 diabetes mellitus ([place]) 01/28/2021   Diabetes mellitus ([place]) 01/01/2021   Class 2 severe obesity with serious comorbidity and body mass index (BMI) of 36.0 to 36.9 in adult [facility]) 01/01/2021   Celiac disease 07/31/2019   Prediabetes 03/02/2016   Microcytic anemia 05/08/2014   Leukocytosis 05/08/2014   Thrombocytosis 05/08/2014   Angioneurotic edema 05/08/2014    PCP: Mayra Neer, MD  REFERRING PROVIDER: Rolm Bookbinder, MD  REFERRING DIAG: Left Breast Cancer  THERAPY DIAG:  No diagnosis found.  Rationale for Evaluation and Treatment Rehabilitation  ONSET DATE:  06/27/2021  SUBJECTIVE:                                                                                                                                                                                           SUBJECTIVE STATEMENT: ***  PERTINENT HISTORY:  Patient was diagnosed on 06/27/2021 with left grade 1 Invasive Lobular Carcinoma. It measured 4 mm and is located in the upper outer quadrant. It is ER+, PR+, HER 2- with a Ki67 of 4%. Pt had a left bracketed lumpectomy with axillary SLNB on 07/28/2021. She does not require chemotherapy. But will need radiation and antiestrogens  PATIENT GOALS:  Reassess how my recovery is going related to arm function, pain, and swelling.  PAIN:  Are  you having pain? {OPRCPAIN:27236}  PRECAUTIONS: Recent Surgery, left UE Lymphedema risk, {Therapy precautions:24002}  ACTIVITY LEVEL / LEISURE: ***   OBJECTIVE:   PATIENT SURVEYS:  QUICK DASH: ***  OBSERVATIONS:  ***  POSTURE:  Forward head, rounded shoulders  LYMPHEDEMA ASSESSMENT:    UPPER EXTREMITY AROM/PROM:   A/PROM RIGHT  07/10/2021    Shoulder extension 62  Shoulder flexion 163  Shoulder abduction 180  Shoulder internal rotation 68  Shoulder external rotation 99                          (Blank rows = not tested)   A/PROM LEFT  07/10/2021 08/22/2019  Shoulder extension 61   Shoulder flexion 170   Shoulder abduction 180   Shoulder internal rotation 70   Shoulder external rotation 99                           (Blank rows = not tested)     CERVICAL AROM: All within normal limits:        UPPER EXTREMITY STRENGTH: WNL     LYMPHEDEMA ASSESSMENTS:    LANDMARK RIGHT  07/10/2021  10 cm proximal to olecranon process 32.6  Olecranon process 26.8  10 cm proximal to ulnar styloid process 23.8  Just proximal to ulnar styloid process 16.6  Across hand at thumb web space 19.4  At base of 2nd digit 5.7  (Blank rows = not tested)   [facility] LEFT  07/10/2021 08/21/2021  10 cm proximal to  olecranon process 33.0   Olecranon process 26.5   10 cm proximal to ulnar styloid process 22.9   Just proximal to ulnar styloid process 16.8   Across hand at thumb web space 19.0   At base of 2nd digit 5.8   (Blank rows = not tested)    Surgery type/Date: Left Bracketed lumpectomy with axillary SLNB 07/28/2021 Number of lymph nodes removed: 0/1 Current/past treatment (chemo, radiation, hormone therapy): no chemo, will have radiation and anati estrogens Other symptoms:  Heaviness/tightness {yes/no:20286} Pain {yes/no:20286} Pitting edema {yes/no:20286} Infections {yes/no:20286} Decreased scar mobility {yes/no:20286} Stemmer sign {yes/no:20286}   PATIENT EDUCATION:  Education details: *** Person educated: {Person educated:25204} Education method: {Education Method:25205} Education comprehension: {Education Comprehension:25206}   HOME EXERCISE PROGRAM:  Reviewed previously given post op HEP. ***  ASSESSMENT:  CLINICAL IMPRESSION: ***  Pt will benefit from skilled therapeutic intervention to improve on the following deficits: Decreased knowledge of precautions, impaired UE functional use, pain, decreased ROM, postural dysfunction.   PT treatment/interventions: ADL/Self care home management, {rehab planned interventions:25118::"Therapeutic exercises","Therapeutic activity","Neuromuscular re-education","Balance training","Gait training","Patient/Family education","Joint mobilization"}     GOALS: Goals reviewed with patient? {yes/no:20286}  LONG TERM GOALS:  (STG=LTG)  GOALS Name Target Date  Goal status  1 Pt will demonstrate she has regained full shoulder ROM and function post operatively compared to baselines.  Baseline: {follow up:25551} {GOALSTATUS:25110}  2  {follow up:25551} {GOALSTATUS:25110}  3  {follow up:25551} {GOALSTATUS:25110}  4  {follow up:25551} {GOALSTATUS:25110}     PLAN: PT FREQUENCY/DURATION: ***  PLAN FOR NEXT SESSION: ***   [facility]  [address]  [place] [address]  [phone]  After Breast Cancer Class It is recommended you attend the ABC class to be educated on lymphedema risk reduction. This class is free of charge and lasts for 1 hour. It is a 1-time class. You will need to download the Webex app  either on your phone or computer. We will send you a link the night before or the morning of the class. You should be able to click on that link to join the class. This is not a confidential class. You don't have to turn your camera on, but other participants may be able to see your email address.  Scar massage You can begin gentle scar massage to you incision sites. Gently place one hand on the incision and move the skin (without sliding on the skin) in various directions. Do this for a few minutes and then you can gently massage either coconut oil or vitamin E cream into the scars.  Compression garment You should continue wearing your compression bra until you feel like you no longer have swelling.  Home exercise Program Continue doing the exercises you were given until you feel like you can do them without feeling any tightness at the end.   Walking Program Studies show that 30 minutes of walking per day (fast enough to elevate your heart rate) can significantly reduce the risk of a cancer recurrence. If you can't walk due to other medical reasons, we encourage you to find another activity you could do (like a stationary bike or water exercise).  Posture After breast cancer surgery, people frequently sit with rounded shoulders posture because it puts their incisions on slack and feels better. If you sit like this and scar tissue forms in that position, you can become very tight and have pain sitting or standing with good posture. Try to be aware of your posture and sit and stand up tall to heal properly.  Follow up PT: It is recommended you return every 3 months for the first 3 years  following surgery to be assessed on the SOZO machine for an L-Dex score. This helps prevent clinically significant lymphedema in 95% of patients. These follow up screens are 10 minute appointments that you are not billed for.  Claris Pong, PT 08/20/2021, 10:11 PM

## 2021-08-21 ENCOUNTER — Ambulatory Visit: Payer: 59 | Attending: General Surgery

## 2021-08-21 DIAGNOSIS — C50412 Malignant neoplasm of upper-outer quadrant of left female breast: Secondary | ICD-10-CM | POA: Diagnosis not present

## 2021-08-21 DIAGNOSIS — R293 Abnormal posture: Secondary | ICD-10-CM | POA: Insufficient documentation

## 2021-08-21 DIAGNOSIS — Z170 Estrogen receptor positive status [ER+]: Secondary | ICD-10-CM | POA: Insufficient documentation

## 2021-08-21 NOTE — Patient Instructions (Signed)
     [facility]  [address], [address]  [place] [address]  [phone]  After Breast Cancer Class It is recommended you attend the ABC class to be educated on lymphedema risk reduction. This class is free of charge and lasts for 1 hour. It is a 1-time class. You will need to download the Webex app either on your phone or computer. We will send you a link the night before or the morning of the class. You should be able to click on that link to join the class. This is not a confidential class. You don't have to turn your camera on, but other participants may be able to see your email address.  Scar massage You can begin gentle scar massage to you incision sites. Gently place one hand on the incision and move the skin (without sliding on the skin) in various directions. Do this for a few minutes and then you can gently massage either coconut oil or vitamin E cream into the scars.  Compression garment You should continue wearing your compression bra until you feel like you no longer have swelling.  Home exercise Program Continue doing the exercises you were given until you feel like you can do them without feeling any tightness at the end.   Walking Program Studies show that 30 minutes of walking per day (fast enough to elevate your heart rate) can significantly reduce the risk of a cancer recurrence. If you can't walk due to other medical reasons, we encourage you to find another activity you could do (like a stationary bike or water exercise).  Posture After breast cancer surgery, people frequently sit with rounded shoulders posture because it puts their incisions on slack and feels better. If you sit like this and scar tissue forms in that position, you can become very tight and have pain sitting or standing with good posture. Try to be aware of your posture and sit and stand up tall to heal properly.  Follow up PT: It is recommended you return every 3 months for  the first 2 years following surgery to be assessed on the SOZO machine for an L-Dex score. This helps prevent clinically significant lymphedema in 95% of patients. These follow up screens are 10 minute appointments that you are not billed for.  

## 2021-08-22 ENCOUNTER — Encounter (HOSPITAL_COMMUNITY): Payer: Self-pay

## 2021-08-25 NOTE — Progress Notes (Signed)
Location of Breast Cancer:  Malignant neoplasm of upper-outer quadrant of left breast in female, estrogen receptor positive  Histology per Pathology Report:  07/28/2021 FINAL MICROSCOPIC DIAGNOSIS:  A.   BREAST, LEFT, LUMPECTOMY:  -    Invasive lobular carcinoma, grade 2, 35 mm in greatest dimension.  -    Lobular carcinoma in situ, florid (F-LCIS), with focal central necrosis.  -    Intraductal papilloma(s) with florid usual duct hyperplasia (UDH).  -    Prior biopsy site changes.  B.   LYMPH NODE, LEFT AXILLARY, SENTINEL, EXCISION:  -    Lymph node, with isolated tumor cells (p i+).  C.   BREAST, LEFT INFERIOR MARGIN, EXCISION:  -    Lobular carcinoma in situ, florid (F-LCIS) at least 2 mm in greatest dimension, new margin negative for LCIS.  -    Distance from F-LCIS to margin:     8 mm.  D. BREAST, LEFT LATERAL MARGIN, EXCISION:  -    Negative for malignancy (in situ and invasive).  E. BREAST, LEFT MEDIAL MARGIN, EXCISION:  -    Negative for malignancy (in situ and invasive).  -    Intraductal papilloma with florid usual duct hyperplasia (UDH).  F. BREAST, LEFT SUPERIOR MARGIN, EXCISION:  -    Negative for malignancy (in situ and invasive).  Receptor Status: ER(90%), PR (80%), Her2-neu (Negative), Ki-67(4%)  Did patient present with symptoms (if so, please note symptoms) or was this found on screening mammography?: Patient had a routine screening mammogram that detected an abnormality in the left breast which led to an ultrasound  Past/Anticipated interventions by surgeon, if any:  Patient reports she's scheduled to see Dr. Donne Hazel tomorrow to assess incision (states it hasn't fully closed/healed)  08/13/2021 Dr. Rolm Bookbinder (office visit) --Physical Exam  Left axillary incision healing well without infection or seroma, left breast incision looks like she either had a reaction to the Steri-Strips or the glue that I had on this and it is superficially separated. There is  no infection.  --Assessment and Plan:  She is doing well overall can return to normal activity. We discussed wound care and if the air in the breast is not healed in the next week or 2 she is going to call me. We will await the Oncotype and then proceed from there. --Return in about 1 year (around 08/14/2022).  07/28/2021 --Dr. Rolm Bookbinder Left breast radioactive seed guided lumpectomy Left deep axillary sentinel lymph node biopsy Injection of mag trace for sentinel lymph node identification  Past/Anticipated interventions by medical oncology, if any:  Under care of Dr. 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 --Return to clinic based on Oncotype DX score  Lymphedema issues, if any:  Denies--had her PT assessment yesterday, and was discharged until F/U in September     Pain issues, if any:  Denies--does report a pressure/heaviness feeling, but otherwise states it's not bothersome   SAFETY ISSUES: Prior radiation? No Pacemaker/ICD? No Possible current pregnancy? No--IUD in place  Is the patient on methotrexate? No  Current Complaints / other details:  She is unsure whether or not her IUD needs to be removed

## 2021-08-25 NOTE — Progress Notes (Signed)
Radiation Oncology         [phone] ________________________________  Name: Elizabeth Cummings MRN: [id number]  Date: 08/26/2021  DOB: [birth year]  Follow-Up Visit Note  Outpatient  CC: Mayra Neer, MD  Nicholas Lose, MD  Diagnosis:   No diagnosis found.   Left Breast UOQ, Invasive and in-situ lobular carcinoma, ER+ / PR+ / Her2-, Grade 2   Cancer Staging  Malignant neoplasm of upper-outer quadrant of left breast in female, estrogen receptor positive ([place]) Staging form: Breast, AJCC 8th Edition - Clinical: Stage Unknown (cTX, cN0, cM0, G1, ER+, PR+, HER2-) - Signed by Nicholas Lose, MD on 07/10/2021 - Pathologic: Stage IA (pT2, pN0(i+), cM0, G2, ER+, PR+, HER2-) - Signed by Nicholas Lose, MD on 08/05/2021  CHIEF COMPLAINT: Here to discuss management of left breast cancer  Narrative:  The patient returns today for follow-up.     Since consultation date of 07/18/21, the patient did not receive any further imaging or genetic testing.     Breast or nodal biopsies, since consultation, involved (dates and results as follows):  -- Biopsy of the lower outer left breast on 07/24/21 revealed grade 1 invasive ductal carcinoma measuring 4 mm in the greatest linear extent, with a small component of low-grade DCIS. Prognostic indicators significant for: estrogen receptor 95% positive with strong-moderate staining intensity; progesterone receptor 100% positive with strong staining intensity; Proliferation marker Ki67 at <1%; Her2 status negative; Grade 1.   The patient opted to proceed with left breast lumpectomy with nodal biopsies on 07/28/21 under the care of Dr. Donne Hazel. Pathology from the procedure revealed: tumor size of 3.5 cm; histology of grade 2 invasive lobular carcinoma and LCIS with focal central necrosis and an intraductal papilloma; all final margins negative for both invasive and in-situ carcinoma; margin status to invasive disease of 2 mm from the anterior margin; margin status  to in situ disease not indicated; nodal status of 1/1 left axillary sentinel lymph node excision negative for carcinoma but with isolated tumor cells;  ER status: 95% positive with strong-moderate staining intensity; PR status 100% positive with strong staining intensity;Proliferation marker Ki67 at 1%; Her2 status negative; Grade 2.  Oncotype DX was obtained on the final surgical sample and the recurrence score of 14 predicts a risk of recurrence outside the breast over the next 9 years of 4%, if the patient's only systemic therapy is an antiestrogen for 5 years.  It also predicts no significant benefit from chemotherapy.  Given Oncotype results, the patient will return to Dr. Lindi Adie in the near future to discuss antiestrogen treatment options.    Symptomatically, the patient reports: ***        ALLERGIES:  is allergic to gluten meal and dilaudid [hydromorphone hcl].  Meds: Current Outpatient Medications  Medication Sig Dispense Refill   azelastine (ASTELIN) 0.1 % nasal spray Place 1-2 sprays into both nostrils 2 (two) times daily. 30 mL 5   calcium-vitamin D (OSCAL WITH D) 500-200 MG-UNIT per tablet Take 1 tablet by mouth daily.     cephALEXin (KEFLEX) 500 MG capsule Take 1 capsule (500 mg total) by mouth 2 (two) times daily for 7 days 14 capsule 0   [employer] FIBER GUMMIES PO Take 2 Units by mouth daily.     fexofenadine (ALLEGRA) 180 MG tablet Take 1 tablet Orally Once a day in the morning. (please fill with levocetirizine) 90 tablet 3   fluticasone (FLONASE) 50 MCG/ACT nasal spray Place 2 sprays into both nostrils daily. 16 g 6  hydrochlorothiazide (HYDRODIURIL) 25 MG tablet Take 1 tablet (25 mg total) by mouth daily for edema. 90 tablet 3   levocetirizine (XYZAL) 5 MG tablet Take 1 tablet (5 mg total) by mouth every evening. 90 tablet 3   levonorgestrel (MIRENA, 52 MG,) 20 MCG/DAY IUD 1 Intra Uterine Device by Intrauterine route once.     Multiple Vitamin (MULTIVITAMIN) tablet Take 1 tablet  by mouth daily.     oxyCODONE (OXY IR/ROXICODONE) 5 MG immediate release tablet Take 1 tablet (5 mg total) by mouth every 6 (six) hours as needed. 10 tablet 0   pravastatin (PRAVACHOL) 20 MG tablet Take 1 tablet (20 mg total) by mouth 2 (two) times a week on Monday and thursday 90 tablet 0   Probiotic Product (DIGESTIVE ADV DIGESTIVE/IMMUNE PO) Take 1 Units by mouth daily.     tirzepatide (MOUNJARO) 12.5 MG/0.5ML Pen Inject 12.5 mg into the skin once a week. 2 mL 2   No current facility-administered medications for this encounter.    Physical Findings:  vitals were not taken for this visit. .     General: Alert and oriented, in no acute distress HEENT: Head is normocephalic. Extraocular movements are intact. Oropharynx is clear. Neck: Neck is supple, no palpable cervical or supraclavicular lymphadenopathy. Heart: Regular in rate and rhythm with no murmurs, rubs, or gallops. Chest: Clear to auscultation bilaterally, with no rhonchi, wheezes, or rales. Abdomen: Soft, nontender, nondistended, with no rigidity or guarding. Extremities: No cyanosis or edema. Lymphatics: see Neck Exam Musculoskeletal: symmetric strength and muscle tone throughout. Neurologic: No obvious focalities. Speech is fluent.  Psychiatric: Judgment and insight are intact. Affect is appropriate. Breast exam reveals ***  Lab Findings: Lab Results  Component Value Date   WBC 13.2 03/14/2021   HGB 12.7 03/14/2021   HCT 38 03/14/2021   MCV 83 11/14/2020   PLT 419 (A) 03/14/2021    _0 @  Radiographic Findings: MM Breast Surgical Specimen  Result Date: 07/28/2021 CLINICAL DATA:  Status post bracketed seed localization of the LEFT breast. EXAM: SPECIMEN RADIOGRAPH OF THE LEFT BREAST COMPARISON:  Prior studies. FINDINGS: Status post excision of the left breast. Two radioactive seeds, an X shaped clip, and a cylinder-shaped are present and completely intact. The findings are discussed with the operating room  nurse at the time of interpretation. IMPRESSION: Specimen radiograph of the left breast. Electronically Signed   By: Nolon Nations M.D.   On: 07/28/2021 14:12   Impression/Plan: We discussed adjuvant radiotherapy today.  I recommend *** in order to ***.  I reviewed the logistics, benefits, risks, and potential side effects of this treatment in detail. Risks may include but not necessary be limited to acute and late injury tissue in the radiation fields such as skin irritation (change in color/pigmentation, itching, dryness, pain, peeling). She may experience fatigue. We also discussed possible risk of long term cosmetic changes or scar tissue. There is also a smaller risk for lung toxicity, ***cardiac toxicity, ***brachial plexopathy, ***lymphedema, ***musculoskeletal changes, ***rib fragility or ***induction of a second malignancy, ***late chronic non-healing soft tissue wound.    The patient asked good questions which I answered to her satisfaction. She is enthusiastic about proceeding with treatment. A consent form has been *** signed and placed in her chart.  A total of *** medically necessary complex treatment devices will be fabricated and supervised by me: *** fields with MLCs for custom blocks to protect heart, and lungs;  and, a Vac-lok. MORE COMPLEX DEVICES MAY BE MADE IN  DOSIMETRY FOR FIELD IN FIELD BEAMS FOR DOSE HOMOGENEITY.  I have requested : 3D Simulation which is medically necessary to give adequate dose to at risk tissues while sparing lungs and heart.  I have requested a DVH of the following structures: lungs, heart, *** lumpectomy cavity.    The patient will receive *** Gy in *** fractions to the *** with *** fields.  This will be *** followed by a boost.  On date of service, in total, I spent *** minutes on this encounter. Patient was seen in person.  _____________________________________   Eppie Gibson, MD  This document serves as a record of services personally performed by  Eppie Gibson, MD. It was created on her behalf by Roney Mans, a trained medical scribe. The creation of this record is based on the scribe's personal observations and the provider's statements to them. This document has been checked and approved by the attending provider.

## 2021-08-26 ENCOUNTER — Ambulatory Visit
Admission: RE | Admit: 2021-08-26 | Discharge: 2021-08-26 | Disposition: A | Discharge status: Home / Self Care | Payer: 59 | Source: Ambulatory Visit | Attending: Radiation Oncology | Admitting: Radiation Oncology

## 2021-08-26 ENCOUNTER — Other Ambulatory Visit: Payer: Self-pay

## 2021-08-26 ENCOUNTER — Encounter: Payer: Self-pay | Admitting: Radiation Oncology

## 2021-08-26 VITALS — BP 115/57 | HR 87 | Resp 16

## 2021-08-26 DIAGNOSIS — Z79899 Other long term (current) drug therapy: Secondary | ICD-10-CM | POA: Insufficient documentation

## 2021-08-26 DIAGNOSIS — Z510 Encounter for antineoplastic radiation therapy: Secondary | ICD-10-CM | POA: Diagnosis not present

## 2021-08-26 DIAGNOSIS — Z793 Long term (current) use of hormonal contraceptives: Secondary | ICD-10-CM | POA: Insufficient documentation

## 2021-08-26 DIAGNOSIS — C50412 Malignant neoplasm of upper-outer quadrant of left female breast: Secondary | ICD-10-CM | POA: Insufficient documentation

## 2021-08-26 DIAGNOSIS — Z170 Estrogen receptor positive status [ER+]: Secondary | ICD-10-CM | POA: Insufficient documentation

## 2021-08-26 LAB — PREGNANCY, URINE: Preg Test, Ur: NEGATIVE

## 2021-08-27 ENCOUNTER — Encounter: Payer: Self-pay | Admitting: *Deleted

## 2021-08-27 ENCOUNTER — Encounter (INDEPENDENT_AMBULATORY_CARE_PROVIDER_SITE_OTHER): Payer: Self-pay | Admitting: Family Medicine

## 2021-08-27 ENCOUNTER — Ambulatory Visit
Admission: RE | Admit: 2021-08-27 | Discharge: 2021-08-27 | Disposition: A | Discharge status: Home / Self Care | Payer: 59 | Source: Ambulatory Visit | Attending: Radiation Oncology | Admitting: Radiation Oncology

## 2021-08-27 DIAGNOSIS — Z510 Encounter for antineoplastic radiation therapy: Secondary | ICD-10-CM | POA: Diagnosis not present

## 2021-08-27 DIAGNOSIS — Z793 Long term (current) use of hormonal contraceptives: Secondary | ICD-10-CM | POA: Diagnosis not present

## 2021-08-27 DIAGNOSIS — Z170 Estrogen receptor positive status [ER+]: Secondary | ICD-10-CM | POA: Diagnosis not present

## 2021-08-27 DIAGNOSIS — C50412 Malignant neoplasm of upper-outer quadrant of left female breast: Secondary | ICD-10-CM | POA: Diagnosis not present

## 2021-08-27 DIAGNOSIS — Z79899 Other long term (current) drug therapy: Secondary | ICD-10-CM | POA: Diagnosis not present

## 2021-08-27 NOTE — Progress Notes (Signed)
TeleHealth Visit:  This visit was completed with telemedicine (audio/video) technology. Elizabeth Cummings has verbally consented to this TeleHealth visit. The patient is located at home, the provider is located at home. The participants in this visit include the listed provider and patient. The visit was conducted today via MyChart video.  OBESITY Elizabeth Cummings is here to discuss her progress with her obesity treatment plan along with follow-up of her obesity related diagnoses.   Today's visit was # 11 Starting weight: 231 lbs Starting date: 11/14/20 Weight reported at last virtual office visit: 204.2  Today's reported weight: 202 lbs  Weight change since last visit: -2    Nutrition Plan: keeping a food journal and adhering to recommended goals of 1200-1300 calories and 90 gms protein.  Hunger is well controlled. Cravings are moderately controlled.  Current exercise: none  Interim History: She has been undergoing breast cancer treatment.  She completed surgery 07/28/21 (lumpectomy and lymph node biopsy) and will be starting 4 weeks of radiation treatment on June 21.  After that she will start a hormone blocker since her tumor is estrogen and progesterone receptor positive. She has not been journaling because she has had a lot on her plate between working full-time, cancer treatment, and remodeling a family member's house.  She is trying to make healthy choices.  She is a  augmenting her protein intake with protein shakes.  Assessment/Plan:  1. Type II Diabetes HgbA1c is at goal. Medication(s): Mounjaro 10 mg weekly.  Notes more hunger and cravings recently.  Lab Results  Component Value Date   HGBA1C 6.3 (H) 06/12/2021   HGBA1C 6.6 03/14/2021   HGBA1C 6.4 (H) 11/14/2020   Lab Results  Component Value Date   LDLCALC 86 03/14/2021   CREATININE 0.70 07/25/2021    Plan: Increase dose of Mounjaro to 15 mg weekly. tirzepatide [facility]) 15 MG/0.5ML Pen   Sig: Inject 15 mg into the skin  once a week.   Dispense:  2 mL   Refill:  0   2.  Frequent UTI She has had a few UTIs recently which is unusual for her.   Plan: Likely related to drop in estrogen levels recently.  Estrogen levels will decrease more after she starts estrogen blocker. Encouraged her to use Replens over-the-counter or Reveree every other day. Advised her that Revaree can be bought online.  It is a hyaluronic acid vaginal suppository.   3.  Perimenopausal vasomotor symptoms She was told by her gynecologist that her hormone levels show she is perimenopausal.  She is having some hot flashes recently.  She stopped her birth control pills which were high estrogen at the advice of her oncologist.  Her breast cancer is estrogen receptor positive. Likely perimenopausal status and stopping her birth control pills is contributing to increased vasomotor symptoms.  After her radiation therapy she will be starting hormonal therapy/estrogen blocker.  Plan: Discussed possibly starting Lexapro to see if she can get some relief from hot flashes with the added benefit of something to help with mood. She wants to think about this.  4. Obesity: Current BMI 34.66 Elizabeth Cummings is currently in the action stage of change. As such, her goal is to continue with weight loss efforts.  She has agreed to keeping a food journal and adhering to recommended goals of 1200-1300 calories and 90 gms protein  OR  practicing portion control and making smarter food choices, such as increasing vegetables and decreasing simple carbohydrates.   Exercise goals: No exercise has been prescribed at this  time.  Behavioral modification strategies: increasing lean protein intake and decreasing simple carbohydrates.  Elizabeth Cummings has agreed to follow-up with our clinic in 4 weeks.   No orders of the defined types were placed in this encounter.   Medications Discontinued During This Encounter  Medication Reason   tirzepatide (MOUNJARO) 12.5 MG/0.5ML Pen  Dose change     Meds ordered this encounter  Medications   tirzepatide (MOUNJARO) 15 MG/0.5ML Pen    Sig: Inject 15 mg into the skin once a week.    Dispense:  2 mL    Refill:  0    Order Specific Question:   Supervising Provider    Answer:   Dennard Nip D [[id number]]      Objective:   VITALS: Per patient if applicable, see vitals. GENERAL: Alert and in no acute distress. CARDIOPULMONARY: No increased WOB. Speaking in clear sentences.  PSYCH: Pleasant and cooperative. Speech normal rate and rhythm. Affect is appropriate. Insight and judgement are appropriate. Attention is focused, linear, and appropriate.  NEURO: Oriented as arrived to appointment on time with no prompting.   Lab Results  Component Value Date   CREATININE 0.70 07/25/2021   BUN 10 07/25/2021   NA 138 07/25/2021   K 3.3 (L) 07/25/2021   CL 101 07/25/2021   CO2 27 07/25/2021   Lab Results  Component Value Date   ALT 17 03/14/2021   AST 17 03/14/2021   ALKPHOS 97 03/14/2021   BILITOT 0.3 11/14/2020   Lab Results  Component Value Date   HGBA1C 6.3 (H) 06/12/2021   HGBA1C 6.6 03/14/2021   HGBA1C 6.4 (H) 11/14/2020   HGBA1C 6.4 12/27/2014   HGBA1C 6.3 09/06/2014   Lab Results  Component Value Date   INSULIN 20.3 11/14/2020   Lab Results  Component Value Date   TSH 0.785 11/14/2020   Lab Results  Component Value Date   CHOL 156 03/14/2021   HDL 49 03/14/2021   LDLCALC 86 03/14/2021   TRIG 121 03/14/2021   Lab Results  Component Value Date   WBC 13.2 03/14/2021   HGB 12.7 03/14/2021   HCT 38 03/14/2021   MCV 83 11/14/2020   PLT 419 (A) 03/14/2021   Lab Results  Component Value Date   IRON 58 05/08/2014   TIBC 506 (H) 05/08/2014   FERRITIN 36 05/08/2014   Lab Results  Component Value Date   VD25OH 50.3 06/12/2021   VD25OH 50.9 11/14/2020    Attestation Statements:   Reviewed by clinician on day of visit: allergies, medications, problem list, medical history, surgical history,  family history, social history, and previous encounter notes.

## 2021-08-28 ENCOUNTER — Other Ambulatory Visit (HOSPITAL_COMMUNITY): Payer: Self-pay

## 2021-08-28 ENCOUNTER — Telehealth: Payer: Self-pay | Admitting: Hematology and Oncology

## 2021-08-28 ENCOUNTER — Encounter (INDEPENDENT_AMBULATORY_CARE_PROVIDER_SITE_OTHER): Payer: Self-pay | Admitting: Family Medicine

## 2021-08-28 ENCOUNTER — Telehealth (INDEPENDENT_AMBULATORY_CARE_PROVIDER_SITE_OTHER): Payer: Self-pay | Admitting: Family Medicine

## 2021-08-28 ENCOUNTER — Encounter (INDEPENDENT_AMBULATORY_CARE_PROVIDER_SITE_OTHER): Payer: Self-pay

## 2021-08-28 ENCOUNTER — Telehealth (INDEPENDENT_AMBULATORY_CARE_PROVIDER_SITE_OTHER): Payer: 59 | Admitting: Family Medicine

## 2021-08-28 VITALS — Ht 64.0 in | Wt 202.0 lb

## 2021-08-28 DIAGNOSIS — Z6834 Body mass index (BMI) 34.0-34.9, adult: Secondary | ICD-10-CM | POA: Diagnosis not present

## 2021-08-28 DIAGNOSIS — N390 Urinary tract infection, site not specified: Secondary | ICD-10-CM

## 2021-08-28 DIAGNOSIS — E1169 Type 2 diabetes mellitus with other specified complication: Secondary | ICD-10-CM

## 2021-08-28 DIAGNOSIS — N951 Menopausal and female climacteric states: Secondary | ICD-10-CM | POA: Diagnosis not present

## 2021-08-28 DIAGNOSIS — E669 Obesity, unspecified: Secondary | ICD-10-CM

## 2021-08-28 DIAGNOSIS — Z7985 Long-term (current) use of injectable non-insulin antidiabetic drugs: Secondary | ICD-10-CM

## 2021-08-28 DIAGNOSIS — Z6836 Body mass index (BMI) 36.0-36.9, adult: Secondary | ICD-10-CM

## 2021-08-28 MED ORDER — MOUNJARO 15 MG/0.5ML ~~LOC~~ SOAJ
15.0000 mg | SUBCUTANEOUS | 0 refills | Status: DC
  Filled 2021-08-28: qty 2, 28d supply, fill #0

## 2021-08-28 NOTE — Telephone Encounter (Signed)
[place] - Prior authorization not required for [employer] 51m. Patient already has prior authorization on file until 07/04/2022. Patient sent mychart message.

## 2021-08-28 NOTE — Telephone Encounter (Signed)
.  Called patient to schedule appointment per 6/7 inbasket, patient is aware of date and time.

## 2021-08-28 NOTE — Telephone Encounter (Signed)
FYI

## 2021-09-05 ENCOUNTER — Ambulatory Visit (HOSPITAL_COMMUNITY)
Admission: RE | Admit: 2021-09-05 | Discharge: 2021-09-05 | Disposition: A | Discharge status: Home / Self Care | Payer: 59 | Source: Ambulatory Visit | Attending: Internal Medicine | Admitting: Internal Medicine

## 2021-09-05 DIAGNOSIS — R7303 Prediabetes: Secondary | ICD-10-CM | POA: Diagnosis not present

## 2021-09-05 DIAGNOSIS — Z0189 Encounter for other specified special examinations: Secondary | ICD-10-CM

## 2021-09-05 DIAGNOSIS — C50919 Malignant neoplasm of unspecified site of unspecified female breast: Secondary | ICD-10-CM | POA: Diagnosis not present

## 2021-09-05 DIAGNOSIS — E785 Hyperlipidemia, unspecified: Secondary | ICD-10-CM | POA: Diagnosis not present

## 2021-09-05 DIAGNOSIS — I10 Essential (primary) hypertension: Secondary | ICD-10-CM | POA: Diagnosis not present

## 2021-09-05 LAB — ECHOCARDIOGRAM COMPLETE
Area-P 1/2: 3.2 cm2
S' Lateral: 2.4 cm

## 2021-09-08 DIAGNOSIS — Z79899 Other long term (current) drug therapy: Secondary | ICD-10-CM | POA: Diagnosis not present

## 2021-09-08 DIAGNOSIS — C50412 Malignant neoplasm of upper-outer quadrant of left female breast: Secondary | ICD-10-CM | POA: Diagnosis not present

## 2021-09-08 DIAGNOSIS — Z793 Long term (current) use of hormonal contraceptives: Secondary | ICD-10-CM | POA: Diagnosis not present

## 2021-09-08 DIAGNOSIS — Z170 Estrogen receptor positive status [ER+]: Secondary | ICD-10-CM | POA: Diagnosis not present

## 2021-09-08 DIAGNOSIS — Z510 Encounter for antineoplastic radiation therapy: Secondary | ICD-10-CM | POA: Diagnosis not present

## 2021-09-10 ENCOUNTER — Other Ambulatory Visit: Payer: Self-pay

## 2021-09-10 ENCOUNTER — Ambulatory Visit
Admission: RE | Admit: 2021-09-10 | Discharge: 2021-09-10 | Disposition: A | Discharge status: Home / Self Care | Payer: 59 | Source: Ambulatory Visit | Attending: Radiation Oncology | Admitting: Radiation Oncology

## 2021-09-10 DIAGNOSIS — C50412 Malignant neoplasm of upper-outer quadrant of left female breast: Secondary | ICD-10-CM | POA: Diagnosis not present

## 2021-09-10 DIAGNOSIS — Z170 Estrogen receptor positive status [ER+]: Secondary | ICD-10-CM | POA: Diagnosis not present

## 2021-09-10 DIAGNOSIS — Z79899 Other long term (current) drug therapy: Secondary | ICD-10-CM | POA: Diagnosis not present

## 2021-09-10 DIAGNOSIS — Z793 Long term (current) use of hormonal contraceptives: Secondary | ICD-10-CM | POA: Diagnosis not present

## 2021-09-10 DIAGNOSIS — Z510 Encounter for antineoplastic radiation therapy: Secondary | ICD-10-CM | POA: Diagnosis not present

## 2021-09-10 LAB — RAD ONC ARIA SESSION SUMMARY
Course Elapsed Days: 0
Plan Fractions Treated to Date: 1
Plan Prescribed Dose Per Fraction: 2.67 Gy
Plan Total Fractions Prescribed: 15
Plan Total Prescribed Dose: 40.05 Gy
Reference Point Dosage Given to Date: 2.67 Gy
Reference Point Session Dosage Given: 2.67 Gy
Session Number: 1

## 2021-09-11 ENCOUNTER — Other Ambulatory Visit: Payer: Self-pay

## 2021-09-11 ENCOUNTER — Ambulatory Visit
Admission: RE | Admit: 2021-09-11 | Discharge: 2021-09-11 | Disposition: A | Discharge status: Home / Self Care | Payer: 59 | Source: Ambulatory Visit | Attending: Radiation Oncology | Admitting: Radiation Oncology

## 2021-09-11 DIAGNOSIS — Z79899 Other long term (current) drug therapy: Secondary | ICD-10-CM | POA: Diagnosis not present

## 2021-09-11 DIAGNOSIS — Z170 Estrogen receptor positive status [ER+]: Secondary | ICD-10-CM | POA: Diagnosis not present

## 2021-09-11 DIAGNOSIS — Z793 Long term (current) use of hormonal contraceptives: Secondary | ICD-10-CM | POA: Diagnosis not present

## 2021-09-11 DIAGNOSIS — C50412 Malignant neoplasm of upper-outer quadrant of left female breast: Secondary | ICD-10-CM | POA: Diagnosis not present

## 2021-09-11 DIAGNOSIS — Z510 Encounter for antineoplastic radiation therapy: Secondary | ICD-10-CM | POA: Diagnosis not present

## 2021-09-11 LAB — RAD ONC ARIA SESSION SUMMARY
Course Elapsed Days: 1
Plan Fractions Treated to Date: 2
Plan Prescribed Dose Per Fraction: 2.67 Gy
Plan Total Fractions Prescribed: 15
Plan Total Prescribed Dose: 40.05 Gy
Reference Point Dosage Given to Date: 5.34 Gy
Reference Point Session Dosage Given: 2.67 Gy
Session Number: 2

## 2021-09-12 ENCOUNTER — Other Ambulatory Visit: Payer: Self-pay

## 2021-09-12 ENCOUNTER — Ambulatory Visit
Admission: RE | Admit: 2021-09-12 | Discharge: 2021-09-12 | Disposition: A | Discharge status: Home / Self Care | Payer: 59 | Source: Ambulatory Visit | Attending: Radiation Oncology | Admitting: Radiation Oncology

## 2021-09-12 DIAGNOSIS — Z793 Long term (current) use of hormonal contraceptives: Secondary | ICD-10-CM | POA: Diagnosis not present

## 2021-09-12 DIAGNOSIS — C50912 Malignant neoplasm of unspecified site of left female breast: Secondary | ICD-10-CM | POA: Diagnosis not present

## 2021-09-12 DIAGNOSIS — Z170 Estrogen receptor positive status [ER+]: Secondary | ICD-10-CM | POA: Diagnosis not present

## 2021-09-12 DIAGNOSIS — Z510 Encounter for antineoplastic radiation therapy: Secondary | ICD-10-CM | POA: Diagnosis not present

## 2021-09-12 DIAGNOSIS — C50412 Malignant neoplasm of upper-outer quadrant of left female breast: Secondary | ICD-10-CM | POA: Diagnosis not present

## 2021-09-12 DIAGNOSIS — Z79899 Other long term (current) drug therapy: Secondary | ICD-10-CM | POA: Diagnosis not present

## 2021-09-12 LAB — RAD ONC ARIA SESSION SUMMARY
Course Elapsed Days: 2
Plan Fractions Treated to Date: 3
Plan Prescribed Dose Per Fraction: 2.67 Gy
Plan Total Fractions Prescribed: 15
Plan Total Prescribed Dose: 40.05 Gy
Reference Point Dosage Given to Date: 8.01 Gy
Reference Point Session Dosage Given: 2.67 Gy
Session Number: 3

## 2021-09-15 ENCOUNTER — Ambulatory Visit
Admission: RE | Admit: 2021-09-15 | Discharge: 2021-09-15 | Disposition: A | Discharge status: Home / Self Care | Payer: 59 | Source: Ambulatory Visit | Attending: Radiation Oncology | Admitting: Radiation Oncology

## 2021-09-15 ENCOUNTER — Other Ambulatory Visit: Payer: Self-pay

## 2021-09-15 DIAGNOSIS — C50412 Malignant neoplasm of upper-outer quadrant of left female breast: Secondary | ICD-10-CM | POA: Diagnosis not present

## 2021-09-15 DIAGNOSIS — Z510 Encounter for antineoplastic radiation therapy: Secondary | ICD-10-CM | POA: Diagnosis not present

## 2021-09-15 DIAGNOSIS — Z170 Estrogen receptor positive status [ER+]: Secondary | ICD-10-CM | POA: Diagnosis not present

## 2021-09-15 DIAGNOSIS — Z793 Long term (current) use of hormonal contraceptives: Secondary | ICD-10-CM | POA: Diagnosis not present

## 2021-09-15 DIAGNOSIS — Z79899 Other long term (current) drug therapy: Secondary | ICD-10-CM | POA: Diagnosis not present

## 2021-09-15 LAB — RAD ONC ARIA SESSION SUMMARY
Course Elapsed Days: 5
Plan Fractions Treated to Date: 4
Plan Prescribed Dose Per Fraction: 2.67 Gy
Plan Total Fractions Prescribed: 15
Plan Total Prescribed Dose: 40.05 Gy
Reference Point Dosage Given to Date: 10.68 Gy
Reference Point Session Dosage Given: 2.67 Gy
Session Number: 4

## 2021-09-15 MED ORDER — ALRA NON-METALLIC DEODORANT (RAD-ONC)
1.0000 | Freq: Once | TOPICAL | Status: AC
  Administered 2021-09-15: 1 via TOPICAL

## 2021-09-15 MED ORDER — RADIAPLEXRX EX GEL: Freq: Once | CUTANEOUS | Status: AC

## 2021-09-16 ENCOUNTER — Other Ambulatory Visit: Payer: Self-pay

## 2021-09-16 ENCOUNTER — Ambulatory Visit
Admission: RE | Admit: 2021-09-16 | Discharge: 2021-09-16 | Disposition: A | Discharge status: Home / Self Care | Payer: 59 | Source: Ambulatory Visit | Attending: Radiation Oncology | Admitting: Radiation Oncology

## 2021-09-16 DIAGNOSIS — Z793 Long term (current) use of hormonal contraceptives: Secondary | ICD-10-CM | POA: Diagnosis not present

## 2021-09-16 DIAGNOSIS — Z510 Encounter for antineoplastic radiation therapy: Secondary | ICD-10-CM | POA: Diagnosis not present

## 2021-09-16 DIAGNOSIS — Z79899 Other long term (current) drug therapy: Secondary | ICD-10-CM | POA: Diagnosis not present

## 2021-09-16 DIAGNOSIS — C50412 Malignant neoplasm of upper-outer quadrant of left female breast: Secondary | ICD-10-CM | POA: Diagnosis not present

## 2021-09-16 DIAGNOSIS — Z170 Estrogen receptor positive status [ER+]: Secondary | ICD-10-CM | POA: Diagnosis not present

## 2021-09-16 LAB — RAD ONC ARIA SESSION SUMMARY
Course Elapsed Days: 6
Plan Fractions Treated to Date: 5
Plan Prescribed Dose Per Fraction: 2.67 Gy
Plan Total Fractions Prescribed: 15
Plan Total Prescribed Dose: 40.05 Gy
Reference Point Dosage Given to Date: 13.35 Gy
Reference Point Session Dosage Given: 2.67 Gy
Session Number: 5

## 2021-09-17 ENCOUNTER — Ambulatory Visit
Admission: RE | Admit: 2021-09-17 | Discharge: 2021-09-17 | Disposition: A | Discharge status: Home / Self Care | Payer: 59 | Source: Ambulatory Visit | Attending: Radiation Oncology | Admitting: Radiation Oncology

## 2021-09-17 ENCOUNTER — Other Ambulatory Visit (HOSPITAL_COMMUNITY): Payer: Self-pay

## 2021-09-17 ENCOUNTER — Other Ambulatory Visit: Payer: Self-pay

## 2021-09-17 DIAGNOSIS — Z170 Estrogen receptor positive status [ER+]: Secondary | ICD-10-CM | POA: Diagnosis not present

## 2021-09-17 DIAGNOSIS — K901 Tropical sprue: Secondary | ICD-10-CM | POA: Diagnosis not present

## 2021-09-17 DIAGNOSIS — E7800 Pure hypercholesterolemia, unspecified: Secondary | ICD-10-CM | POA: Diagnosis not present

## 2021-09-17 DIAGNOSIS — Z793 Long term (current) use of hormonal contraceptives: Secondary | ICD-10-CM | POA: Diagnosis not present

## 2021-09-17 DIAGNOSIS — R300 Dysuria: Secondary | ICD-10-CM | POA: Diagnosis not present

## 2021-09-17 DIAGNOSIS — D72829 Elevated white blood cell count, unspecified: Secondary | ICD-10-CM | POA: Diagnosis not present

## 2021-09-17 DIAGNOSIS — E1169 Type 2 diabetes mellitus with other specified complication: Secondary | ICD-10-CM | POA: Diagnosis not present

## 2021-09-17 DIAGNOSIS — Z0000 Encounter for general adult medical examination without abnormal findings: Secondary | ICD-10-CM | POA: Diagnosis not present

## 2021-09-17 DIAGNOSIS — Z510 Encounter for antineoplastic radiation therapy: Secondary | ICD-10-CM | POA: Diagnosis not present

## 2021-09-17 DIAGNOSIS — C50412 Malignant neoplasm of upper-outer quadrant of left female breast: Secondary | ICD-10-CM | POA: Diagnosis not present

## 2021-09-17 DIAGNOSIS — E669 Obesity, unspecified: Secondary | ICD-10-CM | POA: Diagnosis not present

## 2021-09-17 DIAGNOSIS — D473 Essential (hemorrhagic) thrombocythemia: Secondary | ICD-10-CM | POA: Diagnosis not present

## 2021-09-17 DIAGNOSIS — C50919 Malignant neoplasm of unspecified site of unspecified female breast: Secondary | ICD-10-CM | POA: Diagnosis not present

## 2021-09-17 DIAGNOSIS — Z79899 Other long term (current) drug therapy: Secondary | ICD-10-CM | POA: Diagnosis not present

## 2021-09-17 DIAGNOSIS — L501 Idiopathic urticaria: Secondary | ICD-10-CM | POA: Diagnosis not present

## 2021-09-17 LAB — RAD ONC ARIA SESSION SUMMARY
Course Elapsed Days: 7
Plan Fractions Treated to Date: 6
Plan Prescribed Dose Per Fraction: 2.67 Gy
Plan Total Fractions Prescribed: 15
Plan Total Prescribed Dose: 40.05 Gy
Reference Point Dosage Given to Date: 16.02 Gy
Reference Point Session Dosage Given: 2.67 Gy
Session Number: 6

## 2021-09-17 MED ORDER — CIPROFLOXACIN HCL 500 MG PO TABS
500.0000 mg | ORAL_TABLET | Freq: Two times a day (BID) | ORAL | 0 refills | Status: AC
  Filled 2021-09-17: qty 6, 3d supply, fill #0
  Filled 2021-09-19: qty 14, 7d supply, fill #1

## 2021-09-18 ENCOUNTER — Other Ambulatory Visit: Payer: Self-pay

## 2021-09-18 ENCOUNTER — Ambulatory Visit
Admission: RE | Admit: 2021-09-18 | Discharge: 2021-09-18 | Disposition: A | Discharge status: Home / Self Care | Payer: 59 | Source: Ambulatory Visit | Attending: Radiation Oncology | Admitting: Radiation Oncology

## 2021-09-18 ENCOUNTER — Ambulatory Visit
Admission: RE | Admit: 2021-09-18 | Discharge: 2021-09-18 | Disposition: A | Discharge status: Home / Self Care | Payer: 59 | Source: Ambulatory Visit | Attending: Family Medicine | Admitting: Family Medicine

## 2021-09-18 ENCOUNTER — Other Ambulatory Visit: Payer: Self-pay | Admitting: Family Medicine

## 2021-09-18 DIAGNOSIS — R2232 Localized swelling, mass and lump, left upper limb: Secondary | ICD-10-CM | POA: Diagnosis not present

## 2021-09-18 DIAGNOSIS — Z510 Encounter for antineoplastic radiation therapy: Secondary | ICD-10-CM | POA: Diagnosis not present

## 2021-09-18 DIAGNOSIS — Z793 Long term (current) use of hormonal contraceptives: Secondary | ICD-10-CM | POA: Diagnosis not present

## 2021-09-18 DIAGNOSIS — R2230 Localized swelling, mass and lump, unspecified upper limb: Secondary | ICD-10-CM

## 2021-09-18 DIAGNOSIS — Z79899 Other long term (current) drug therapy: Secondary | ICD-10-CM | POA: Diagnosis not present

## 2021-09-18 DIAGNOSIS — C50412 Malignant neoplasm of upper-outer quadrant of left female breast: Secondary | ICD-10-CM | POA: Diagnosis not present

## 2021-09-18 DIAGNOSIS — Z170 Estrogen receptor positive status [ER+]: Secondary | ICD-10-CM | POA: Diagnosis not present

## 2021-09-18 LAB — RAD ONC ARIA SESSION SUMMARY
Course Elapsed Days: 8
Plan Fractions Treated to Date: 7
Plan Prescribed Dose Per Fraction: 2.67 Gy
Plan Total Fractions Prescribed: 15
Plan Total Prescribed Dose: 40.05 Gy
Reference Point Dosage Given to Date: 18.69 Gy
Reference Point Session Dosage Given: 2.67 Gy
Session Number: 7

## 2021-09-19 ENCOUNTER — Other Ambulatory Visit (HOSPITAL_COMMUNITY): Payer: Self-pay

## 2021-09-19 ENCOUNTER — Other Ambulatory Visit: Payer: Self-pay

## 2021-09-19 ENCOUNTER — Ambulatory Visit
Admission: RE | Admit: 2021-09-19 | Discharge: 2021-09-19 | Disposition: A | Discharge status: Home / Self Care | Payer: 59 | Source: Ambulatory Visit | Attending: Radiation Oncology | Admitting: Radiation Oncology

## 2021-09-19 DIAGNOSIS — Z79899 Other long term (current) drug therapy: Secondary | ICD-10-CM | POA: Diagnosis not present

## 2021-09-19 DIAGNOSIS — Z793 Long term (current) use of hormonal contraceptives: Secondary | ICD-10-CM | POA: Diagnosis not present

## 2021-09-19 DIAGNOSIS — C50412 Malignant neoplasm of upper-outer quadrant of left female breast: Secondary | ICD-10-CM | POA: Diagnosis not present

## 2021-09-19 DIAGNOSIS — Z510 Encounter for antineoplastic radiation therapy: Secondary | ICD-10-CM | POA: Diagnosis not present

## 2021-09-19 DIAGNOSIS — Z170 Estrogen receptor positive status [ER+]: Secondary | ICD-10-CM | POA: Diagnosis not present

## 2021-09-19 LAB — RAD ONC ARIA SESSION SUMMARY
Course Elapsed Days: 9
Plan Fractions Treated to Date: 8
Plan Prescribed Dose Per Fraction: 2.67 Gy
Plan Total Fractions Prescribed: 15
Plan Total Prescribed Dose: 40.05 Gy
Reference Point Dosage Given to Date: 21.36 Gy
Reference Point Session Dosage Given: 2.67 Gy
Session Number: 8

## 2021-09-22 ENCOUNTER — Ambulatory Visit: Payer: 59

## 2021-09-22 ENCOUNTER — Ambulatory Visit
Admission: RE | Admit: 2021-09-22 | Discharge: 2021-09-22 | Disposition: A | Discharge status: Home / Self Care | Payer: 59 | Source: Ambulatory Visit | Attending: Radiation Oncology | Admitting: Radiation Oncology

## 2021-09-22 ENCOUNTER — Other Ambulatory Visit: Payer: Self-pay

## 2021-09-22 DIAGNOSIS — Z510 Encounter for antineoplastic radiation therapy: Secondary | ICD-10-CM | POA: Insufficient documentation

## 2021-09-22 DIAGNOSIS — Z793 Long term (current) use of hormonal contraceptives: Secondary | ICD-10-CM | POA: Insufficient documentation

## 2021-09-22 DIAGNOSIS — Z79810 Long term (current) use of selective estrogen receptor modulators (SERMs): Secondary | ICD-10-CM | POA: Diagnosis not present

## 2021-09-22 DIAGNOSIS — Z79899 Other long term (current) drug therapy: Secondary | ICD-10-CM | POA: Insufficient documentation

## 2021-09-22 DIAGNOSIS — Z170 Estrogen receptor positive status [ER+]: Secondary | ICD-10-CM | POA: Insufficient documentation

## 2021-09-22 DIAGNOSIS — C50412 Malignant neoplasm of upper-outer quadrant of left female breast: Secondary | ICD-10-CM | POA: Insufficient documentation

## 2021-09-22 DIAGNOSIS — Z923 Personal history of irradiation: Secondary | ICD-10-CM | POA: Diagnosis not present

## 2021-09-22 LAB — RAD ONC ARIA SESSION SUMMARY
Course Elapsed Days: 12
Plan Fractions Treated to Date: 9
Plan Prescribed Dose Per Fraction: 2.67 Gy
Plan Total Fractions Prescribed: 15
Plan Total Prescribed Dose: 40.05 Gy
Reference Point Dosage Given to Date: 24.03 Gy
Reference Point Session Dosage Given: 2.67 Gy
Session Number: 9

## 2021-09-24 ENCOUNTER — Other Ambulatory Visit: Payer: Self-pay

## 2021-09-24 ENCOUNTER — Ambulatory Visit
Admission: RE | Admit: 2021-09-24 | Discharge: 2021-09-24 | Disposition: A | Discharge status: Home / Self Care | Payer: 59 | Source: Ambulatory Visit | Attending: Radiation Oncology | Admitting: Radiation Oncology

## 2021-09-24 DIAGNOSIS — Z79899 Other long term (current) drug therapy: Secondary | ICD-10-CM | POA: Insufficient documentation

## 2021-09-24 DIAGNOSIS — Z793 Long term (current) use of hormonal contraceptives: Secondary | ICD-10-CM | POA: Insufficient documentation

## 2021-09-24 DIAGNOSIS — C50412 Malignant neoplasm of upper-outer quadrant of left female breast: Secondary | ICD-10-CM | POA: Diagnosis not present

## 2021-09-24 DIAGNOSIS — Z79810 Long term (current) use of selective estrogen receptor modulators (SERMs): Secondary | ICD-10-CM | POA: Diagnosis not present

## 2021-09-24 DIAGNOSIS — Z510 Encounter for antineoplastic radiation therapy: Secondary | ICD-10-CM | POA: Diagnosis not present

## 2021-09-24 DIAGNOSIS — Z923 Personal history of irradiation: Secondary | ICD-10-CM | POA: Diagnosis not present

## 2021-09-24 DIAGNOSIS — Z170 Estrogen receptor positive status [ER+]: Secondary | ICD-10-CM | POA: Insufficient documentation

## 2021-09-24 LAB — RAD ONC ARIA SESSION SUMMARY
Course Elapsed Days: 14
Plan Fractions Treated to Date: 10
Plan Prescribed Dose Per Fraction: 2.67 Gy
Plan Total Fractions Prescribed: 15
Plan Total Prescribed Dose: 40.05 Gy
Reference Point Dosage Given to Date: 26.7 Gy
Reference Point Session Dosage Given: 2.67 Gy
Session Number: 10

## 2021-09-24 NOTE — Progress Notes (Signed)
Patient Care Team: Mayra Neer, MD as PCP - General (Family Medicine) Bensimhon, Shaune Pascal, MD as Consulting Physician (Cardiology) Tiajuana Amass, MD as Referring Physician (Allergy and Immunology) Mauro Kaufmann, RN as Oncology Nurse Navigator Rockwell Germany, RN as Oncology Nurse Navigator Nicholas Lose, MD as Consulting Physician (Hematology and Oncology)  DIAGNOSIS:  Encounter Diagnosis  Name Primary?   Malignant neoplasm of upper-outer quadrant of left breast in female, estrogen receptor positive ([place] )     SUMMARY OF ONCOLOGIC HISTORY: Oncology History  Malignant neoplasm of upper-outer quadrant of left breast in female, estrogen receptor positive ([place])  06/27/2021 Initial Diagnosis   Screening mammogram detected left breast asymmetry and distortion.  No ultrasound correlate.  Axilla negative, stereotactic biopsy showed grade 1 invasive lobular cancer with LCIS, negative for lymphovascular invasion, ER 90%, PR 80%, Ki-67 4%, HER2 equivocal by IHC, FISH negative, ratio 1.21   07/10/2021 Cancer Staging   Staging form: Breast, AJCC 8th Edition - Clinical: Stage Unknown (cTX, cN0, cM0, G1, ER+, PR+, HER2-) - Signed by Nicholas Lose, MD on 07/10/2021 Stage prefix: Initial diagnosis Histologic grading system: 3 grade system    Genetic Testing   Negative genetic testing. No pathogenic variants identified on the [facility] BRCAPlus+ CancerNext-Expanded+RNA panel. VUS in KIF1B called c.[id number] identified. The report date is 07/25/2021.  The CancerNext-Expanded + RNAinsight gene panel offered by [employer] and includes sequencing and rearrangement analysis for the following 77 genes: IP, ALK, APC*, ATM*, AXIN2, BAP1, BARD1, BLM, BMPR1A, BRCA1*, BRCA2*, BRIP1*, CDC73, CDH1*,CDK4, CDKN1B, CDKN2A, CHEK2*, CTNNA1, DICER1, FANCC, FH, FLCN, GALNT12, KIF1B, LZTR1, MAX, MEN1, MET, MLH1*, MSH2*, MSH3, MSH6*, MUTYH*, NBN, NF1*, NF2, NTHL1, PALB2*, PHOX2B, PMS2*, POT1, PRKAR1A, PTCH1, PTEN*, RAD51C*,  RAD51D*,RB1, RECQL, RET, SDHA, SDHAF2, SDHB, SDHC, SDHD, SMAD4, SMARCA4, SMARCB1, SMARCE1, STK11, SUFU, TMEM127, TP53*,TSC1, TSC2, VHL and XRCC2 (sequencing and deletion/duplication); EGFR, EGLN1, HOXB13, KIT, MITF, PDGFRA, POLD1 and POLE (sequencing only); EPCAM and GREM1 (deletion/duplication only).   08/05/2021 Cancer Staging   Staging form: Breast, AJCC 8th Edition - Pathologic: Stage IA (pT2, pN0(i+), cM0, G2, ER+, PR+, HER2-) - Signed by Nicholas Lose, MD on 08/05/2021 Multigene prognostic tests performed: Oncotype DX Histologic grading system: 3 grade system   08/12/2021 Oncotype testing   Oncotype score 14: Distant recurrence at 9 years: 4%   09/11/2021 - 10/08/2021 Radiation Therapy   Adjuvant radiation therapy     CHIEF COMPLIANT: Follow-up after radiation    INTERVAL HISTORY: Elizabeth Cummings is a 66-year with above-mentioned history of left breast cancer. She presents to the clinic today for a follow-up.   ALLERGIES:  is allergic to gluten meal and dilaudid [hydromorphone hcl].  MEDICATIONS:  Current Outpatient Medications  Medication Sig Dispense Refill   tamoxifen (NOLVADEX) 20 MG tablet Take 1 tablet (20 mg total) by mouth daily. 90 tablet 3   azelastine (ASTELIN) 0.1 % nasal spray Place 1-2 sprays into both nostrils 2 (two) times daily. 30 mL 5   calcium-vitamin D (OSCAL WITH D) 500-200 MG-UNIT per tablet Take 1 tablet by mouth daily.     [employer] FIBER GUMMIES PO Take 2 Units by mouth daily.     fexofenadine (ALLEGRA) 180 MG tablet Take 1 tablet Orally Once a day in the morning. (please fill with levocetirizine) 90 tablet 3   fluticasone (FLONASE) 50 MCG/ACT nasal spray Place 2 sprays into both nostrils daily. 16 g 6   hydrochlorothiazide (HYDRODIURIL) 25 MG tablet Take 1 tablet (25 mg total) by mouth daily for edema. 90 tablet  3   levocetirizine (XYZAL) 5 MG tablet Take 1 tablet (5 mg total) by mouth every evening. 90 tablet 3   levonorgestrel (MIRENA, 52 MG,) 20 MCG/DAY  IUD 1 Intra Uterine Device by Intrauterine route once.     Multiple Vitamin (MULTIVITAMIN) tablet Take 1 tablet by mouth daily.     pravastatin (PRAVACHOL) 20 MG tablet Take 1 tablet (20 mg total) by mouth 2 (two) times a week on Monday and thursday 90 tablet 0   Probiotic Product (DIGESTIVE ADV DIGESTIVE/IMMUNE PO) Take 1 Units by mouth daily.     tirzepatide (MOUNJARO) 12.5 MG/0.5ML Pen Inject 12.5 mg into the skin once a week. 2 mL 0   No current facility-administered medications for this visit.    PHYSICAL EXAMINATION: ECOG PERFORMANCE STATUS: 1 - Symptomatic but completely ambulatory  Vitals:   10/07/21 1515  BP: 119/78  Pulse: 94  Resp: 18  Temp: (!) 97.3 F (36.3 C)  SpO2: 97%   Filed Weights   10/07/21 1515  Weight: 202 lb 6.4 oz (91.8 kg)      LABORATORY DATA:  I have reviewed the data as listed    Latest Ref Rng & Units 07/25/2021    8:44 AM 03/14/2021   12:00 AM 11/14/2020    3:29 PM  CMP  Glucose 70 - 99 mg/dL 93   82   BUN 6 - 20 mg/dL 10  10     11    Creatinine 0.44 - 1.00 mg/dL 0.70  0.7     0.66   Sodium 135 - 145 mmol/L 138  139     141   Potassium 3.5 - 5.1 mmol/L 3.3  3.6     4.0   Chloride 98 - 111 mmol/L 101  99     100   CO2 22 - 32 mmol/L 27  28     21    Calcium 8.9 - 10.3 mg/dL 9.3  9.6     9.6   Total Protein 6.0 - 8.5 g/dL   7.4   Total Bilirubin 0.0 - 1.2 mg/dL   0.3   Alkaline Phos 25 - 125  97     111   AST 13 - 35  17     38   ALT 7 - 35  17     22      This result is from an external source.    Lab Results  Component Value Date   WBC 13.2 03/14/2021   HGB 12.7 03/14/2021   HCT 38 03/14/2021   MCV 83 11/14/2020   PLT 419 (A) 03/14/2021   NEUTROABS 7.3 (H) 11/14/2020    ASSESSMENT & PLAN:  Malignant neoplasm of upper-outer quadrant of left breast in female, estrogen receptor positive ([place]) 07/28/2021: Left lumpectomy: Grade 2 ILC 3.5 cm with LCIS, intraductal papilloma with UBH, margins negative, 1 lymph node with isolated tumor  cells, ER 90%, PR 80%, HER2 negative, Ki-67 4% Oncotype score 14: Distant recurrence at 9 years: 4% 09/11/2021-10/08/2021 Adjuvant radiation:   Current treatment: adjuvant antiestrogen therapy with tamoxifen 20 mg daily x10 years (she will start at 10 mg a day for the first month and then increase it to 20 if she tolerates it well)   Tamoxifen counseling: We discussed the risks and benefits of tamoxifen. These include but not limited to insomnia, hot flashes, mood changes, vaginal dryness, and weight gain. Although rare, serious side effects including endometrial cancer, risk of blood clots were also discussed. We strongly  believe that the benefits far outweigh the risks. Patient understands these risks and consented to starting treatment. Planned treatment duration is 10 years.   Brenas 25, estradiol 20: Patient is perimenopausal.  She will recheck these labs with her primary care physician in 6 months. We will continue with tamoxifen until she becomes menopausal.  She currently has Mirena and will keep it until she is in menopause and at that time she will remove it.  Weight: Patient goes to the healthy weight [facility] wellness clinic and has lost 30 pounds with Monjuro.  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 Return to clinic  in 3 months for survivorship care plan visit    No orders of the defined types were placed in this encounter.  The patient has a good understanding of the overall plan. she agrees with it. she will call with any problems that may develop before the next visit here. Total time spent: 30 mins including face to face time and time spent for planning, charting and co-ordination of care   Harriette Ohara, MD 10/07/21    I Gardiner Coins am scribing for Dr. Lindi Adie  I have reviewed the above documentation for accuracy and completeness, and I agree with the above.

## 2021-09-25 ENCOUNTER — Other Ambulatory Visit (HOSPITAL_COMMUNITY): Payer: Self-pay

## 2021-09-25 ENCOUNTER — Ambulatory Visit
Admission: RE | Admit: 2021-09-25 | Discharge: 2021-09-25 | Disposition: A | Discharge status: Home / Self Care | Payer: 59 | Source: Ambulatory Visit | Attending: Radiation Oncology | Admitting: Radiation Oncology

## 2021-09-25 ENCOUNTER — Encounter (INDEPENDENT_AMBULATORY_CARE_PROVIDER_SITE_OTHER): Payer: Self-pay | Admitting: Family Medicine

## 2021-09-25 ENCOUNTER — Other Ambulatory Visit (INDEPENDENT_AMBULATORY_CARE_PROVIDER_SITE_OTHER): Payer: Self-pay | Admitting: Family Medicine

## 2021-09-25 ENCOUNTER — Other Ambulatory Visit: Payer: Self-pay

## 2021-09-25 DIAGNOSIS — C50412 Malignant neoplasm of upper-outer quadrant of left female breast: Secondary | ICD-10-CM | POA: Diagnosis not present

## 2021-09-25 DIAGNOSIS — Z79899 Other long term (current) drug therapy: Secondary | ICD-10-CM | POA: Diagnosis not present

## 2021-09-25 DIAGNOSIS — E1169 Type 2 diabetes mellitus with other specified complication: Secondary | ICD-10-CM

## 2021-09-25 DIAGNOSIS — Z79810 Long term (current) use of selective estrogen receptor modulators (SERMs): Secondary | ICD-10-CM | POA: Diagnosis not present

## 2021-09-25 DIAGNOSIS — Z170 Estrogen receptor positive status [ER+]: Secondary | ICD-10-CM | POA: Diagnosis not present

## 2021-09-25 DIAGNOSIS — Z923 Personal history of irradiation: Secondary | ICD-10-CM | POA: Diagnosis not present

## 2021-09-25 DIAGNOSIS — Z510 Encounter for antineoplastic radiation therapy: Secondary | ICD-10-CM | POA: Diagnosis not present

## 2021-09-25 DIAGNOSIS — Z793 Long term (current) use of hormonal contraceptives: Secondary | ICD-10-CM | POA: Diagnosis not present

## 2021-09-25 DIAGNOSIS — C50912 Malignant neoplasm of unspecified site of left female breast: Secondary | ICD-10-CM | POA: Diagnosis not present

## 2021-09-25 LAB — RAD ONC ARIA SESSION SUMMARY
Course Elapsed Days: 15
Plan Fractions Treated to Date: 11
Plan Prescribed Dose Per Fraction: 2.67 Gy
Plan Total Fractions Prescribed: 15
Plan Total Prescribed Dose: 40.05 Gy
Reference Point Dosage Given to Date: 29.37 Gy
Reference Point Session Dosage Given: 2.67 Gy
Session Number: 11

## 2021-09-25 MED ORDER — MOUNJARO 15 MG/0.5ML ~~LOC~~ SOAJ
15.0000 mg | SUBCUTANEOUS | 0 refills | Status: DC
  Filled 2021-09-25: qty 2, 28d supply, fill #0

## 2021-09-26 ENCOUNTER — Other Ambulatory Visit: Payer: Self-pay

## 2021-09-26 ENCOUNTER — Ambulatory Visit
Admission: RE | Admit: 2021-09-26 | Discharge: 2021-09-26 | Disposition: A | Discharge status: Home / Self Care | Payer: 59 | Source: Ambulatory Visit | Attending: Radiation Oncology | Admitting: Radiation Oncology

## 2021-09-26 DIAGNOSIS — C50412 Malignant neoplasm of upper-outer quadrant of left female breast: Secondary | ICD-10-CM | POA: Diagnosis not present

## 2021-09-26 DIAGNOSIS — Z510 Encounter for antineoplastic radiation therapy: Secondary | ICD-10-CM | POA: Diagnosis not present

## 2021-09-26 DIAGNOSIS — Z793 Long term (current) use of hormonal contraceptives: Secondary | ICD-10-CM | POA: Diagnosis not present

## 2021-09-26 DIAGNOSIS — Z79899 Other long term (current) drug therapy: Secondary | ICD-10-CM | POA: Diagnosis not present

## 2021-09-26 DIAGNOSIS — Z170 Estrogen receptor positive status [ER+]: Secondary | ICD-10-CM | POA: Diagnosis not present

## 2021-09-26 DIAGNOSIS — Z923 Personal history of irradiation: Secondary | ICD-10-CM | POA: Diagnosis not present

## 2021-09-26 DIAGNOSIS — Z79810 Long term (current) use of selective estrogen receptor modulators (SERMs): Secondary | ICD-10-CM | POA: Diagnosis not present

## 2021-09-26 LAB — RAD ONC ARIA SESSION SUMMARY
Course Elapsed Days: 16
Plan Fractions Treated to Date: 12
Plan Prescribed Dose Per Fraction: 2.67 Gy
Plan Total Fractions Prescribed: 15
Plan Total Prescribed Dose: 40.05 Gy
Reference Point Dosage Given to Date: 32.04 Gy
Reference Point Session Dosage Given: 2.67 Gy
Session Number: 12

## 2021-09-29 ENCOUNTER — Telehealth: Payer: Self-pay | Admitting: *Deleted

## 2021-09-29 ENCOUNTER — Ambulatory Visit: Payer: 59

## 2021-09-29 ENCOUNTER — Other Ambulatory Visit: Payer: Self-pay

## 2021-09-29 ENCOUNTER — Ambulatory Visit
Admission: RE | Admit: 2021-09-29 | Discharge: 2021-09-29 | Disposition: A | Discharge status: Home / Self Care | Payer: 59 | Source: Ambulatory Visit | Attending: Radiation Oncology | Admitting: Radiation Oncology

## 2021-09-29 ENCOUNTER — Ambulatory Visit: Payer: 59 | Admitting: Radiation Oncology

## 2021-09-29 DIAGNOSIS — C50412 Malignant neoplasm of upper-outer quadrant of left female breast: Secondary | ICD-10-CM | POA: Diagnosis not present

## 2021-09-29 DIAGNOSIS — Z79810 Long term (current) use of selective estrogen receptor modulators (SERMs): Secondary | ICD-10-CM | POA: Diagnosis not present

## 2021-09-29 DIAGNOSIS — Z793 Long term (current) use of hormonal contraceptives: Secondary | ICD-10-CM | POA: Diagnosis not present

## 2021-09-29 DIAGNOSIS — Z79899 Other long term (current) drug therapy: Secondary | ICD-10-CM | POA: Diagnosis not present

## 2021-09-29 DIAGNOSIS — Z510 Encounter for antineoplastic radiation therapy: Secondary | ICD-10-CM | POA: Diagnosis not present

## 2021-09-29 DIAGNOSIS — Z923 Personal history of irradiation: Secondary | ICD-10-CM | POA: Diagnosis not present

## 2021-09-29 DIAGNOSIS — Z170 Estrogen receptor positive status [ER+]: Secondary | ICD-10-CM | POA: Diagnosis not present

## 2021-09-29 LAB — RAD ONC ARIA SESSION SUMMARY
Course Elapsed Days: 19
Plan Fractions Treated to Date: 13
Plan Prescribed Dose Per Fraction: 2.67 Gy
Plan Total Fractions Prescribed: 15
Plan Total Prescribed Dose: 40.05 Gy
Reference Point Dosage Given to Date: 34.71 Gy
Reference Point Session Dosage Given: 2.67 Gy
Session Number: 13

## 2021-09-29 NOTE — Progress Notes (Signed)
TeleHealth Visit:  This visit was completed with telemedicine (audio/video) technology. Elizabeth Cummings has verbally consented to this TeleHealth visit. The patient is located at home, the provider is located at home. The participants in this visit include the listed provider and patient. The visit was conducted today via MyChart video.  OBESITY Elizabeth Cummings is here to discuss her progress with her obesity treatment plan along with follow-up of her obesity related diagnoses.   Today's visit was # 12 Starting weight: 231 lbs Starting date: 11/14/20 Weight reported at last virtual office visit: 202 lbs on 08/28/21 Total weight loss: 30 lbs Today's reported weight: 201 lbs Weight change since last visit: -1  Nutrition Plan: keeping a food journal and adhering to recommended goals of 1200-1300 calories and 90 protein.  Hunger is well controlled. Cravings are well controlled.  Current exercise: taking steps and walking 14 minutes/day  Interim History: Elizabeth Cummings is currently undergoing radiation treatment for breast cancer.  Her last day of radiation is Wednesday, July 19.  So far she is tolerating it well. She has not been journaling recently but is trying to make healthier choices.  She has also increased her activity by parking further away from work and taking steps rather than elevators.  Assessment/Plan:  1. Type II Diabetes HgbA1c is at goal.  Last A1c was 6.3 at PCP office on 09/17/2021.Marland Kitchen  Urine showed microalbumin but PCP plans on repeating this in a month since [place] had a UTI at the same time. CBGs: Not checking Episodes of hypoglycemia? no Medication(s): Mounjaro 15 mg weekly.  She is out of 15 mg due to national drug shortage but she does have two 7.5 injections at home. She takes pravastatin 20 mg twice weekly.  LDL not at goal.  Last LDL was 90 at PCP on 09/17/2021.  Lab Results  Component Value Date   HGBA1C 6.3 (H) 06/12/2021   HGBA1C 6.6 03/14/2021   HGBA1C 6.4 (H)  11/14/2020   Lab Results  Component Value Date   LDLCALC 86 03/14/2021   CREATININE 0.70 07/25/2021    Plan: She will take 2 of the Mounjaro 7.5 injections this week.  [facility] pharmacy said that they think they will have the Mounjaro 15 mg in next week.  2.  Hypertension associated with type 2 diabetes Hypertension Hypertension well controlled.  Medication(s): HCTZ 25 mg daily.  BP Readings from Last 3 Encounters:  09/30/21 116/81  08/26/21 (!) 115/57  08/05/21 114/85   Lab Results  Component Value Date   CREATININE 0.70 07/25/2021   CREATININE 0.7 03/14/2021   CREATININE 0.66 11/14/2020    Plan: Continue HCTZ 25 mg daily..   3.  Vaginal dryness Has noted vaginal dryness recently and had recent UTI.  She will be starting estrogen blocker after radiation is finished for treatment of her breast cancer.  Plan: Begin using over-the-counter Replens every other day. Educated about vaginal symptoms she may experience from lack of estrogen.  4. Obesity: Current BMI 34.48 Elizabeth Cummings is currently in the action stage of change. As such, her goal is to continue with weight loss efforts.  She has agreed to keeping a food journal and adhering to recommended goals of 1200-1300 calories and 90 gms protein and practicing portion control and making smarter food choices, such as increasing vegetables and decreasing simple carbohydrates.   Exercise goals: as is  Behavioral modification strategies: increasing lean protein intake and decreasing simple carbohydrates.  Elizabeth Cummings has agreed to follow-up with our clinic in 4 weeks.  No orders of the defined types were placed in this encounter.   Medications Discontinued During This Encounter  Medication Reason   oxyCODONE (OXY IR/ROXICODONE) 5 MG immediate release tablet Patient Preference     No orders of the defined types were placed in this encounter.     Objective:   VITALS: Per patient if applicable, see vitals. GENERAL:  Alert and in no acute distress. CARDIOPULMONARY: No increased WOB. Speaking in clear sentences.  PSYCH: Pleasant and cooperative. Speech normal rate and rhythm. Affect is appropriate. Insight and judgement are appropriate. Attention is focused, linear, and appropriate.  NEURO: Oriented as arrived to appointment on time with no prompting.   Lab Results  Component Value Date   CREATININE 0.70 07/25/2021   BUN 10 07/25/2021   NA 138 07/25/2021   K 3.3 (L) 07/25/2021   CL 101 07/25/2021   CO2 27 07/25/2021   Lab Results  Component Value Date   ALT 17 03/14/2021   AST 17 03/14/2021   ALKPHOS 97 03/14/2021   BILITOT 0.3 11/14/2020   Lab Results  Component Value Date   HGBA1C 6.3 (H) 06/12/2021   HGBA1C 6.6 03/14/2021   HGBA1C 6.4 (H) 11/14/2020   HGBA1C 6.4 12/27/2014   HGBA1C 6.3 09/06/2014   Lab Results  Component Value Date   INSULIN 20.3 11/14/2020   Lab Results  Component Value Date   TSH 0.785 11/14/2020   Lab Results  Component Value Date   CHOL 156 03/14/2021   HDL 49 03/14/2021   LDLCALC 86 03/14/2021   TRIG 121 03/14/2021   Lab Results  Component Value Date   WBC 13.2 03/14/2021   HGB 12.7 03/14/2021   HCT 38 03/14/2021   MCV 83 11/14/2020   PLT 419 (A) 03/14/2021   Lab Results  Component Value Date   IRON 58 05/08/2014   TIBC 506 (H) 05/08/2014   FERRITIN 36 05/08/2014   Lab Results  Component Value Date   VD25OH 50.3 06/12/2021   VD25OH 50.9 11/14/2020    Attestation Statements:   Reviewed by clinician on day of visit: allergies, medications, problem list, medical history, surgical history, family history, social history, and previous encounter notes.  Time spent on visit including the items listed below  and was 35 minutes.  -preparing to see the patient (e.g., review of tests, history, previous notes) -obtaining and/or reviewing separately obtained history -counseling and educating the patient/family/caregiver -documenting clinical  information in the electronic or other health record -ordering medications, tests, or procedures -independently interpreting results and communicating results to the patient

## 2021-09-29 NOTE — Telephone Encounter (Signed)
RETURNED PATIENT'S PHONE CALL, SPOKE WITH PATIENT. ?

## 2021-09-30 ENCOUNTER — Encounter (INDEPENDENT_AMBULATORY_CARE_PROVIDER_SITE_OTHER): Payer: Self-pay | Admitting: Family Medicine

## 2021-09-30 ENCOUNTER — Telehealth (INDEPENDENT_AMBULATORY_CARE_PROVIDER_SITE_OTHER): Payer: 59 | Admitting: Family Medicine

## 2021-09-30 ENCOUNTER — Ambulatory Visit
Admission: RE | Admit: 2021-09-30 | Discharge: 2021-09-30 | Disposition: A | Discharge status: Home / Self Care | Payer: 59 | Source: Ambulatory Visit | Attending: Radiation Oncology | Admitting: Radiation Oncology

## 2021-09-30 ENCOUNTER — Other Ambulatory Visit: Payer: Self-pay

## 2021-09-30 VITALS — BP 116/81 | HR 92 | Ht 64.0 in | Wt 201.0 lb

## 2021-09-30 DIAGNOSIS — Z170 Estrogen receptor positive status [ER+]: Secondary | ICD-10-CM | POA: Diagnosis not present

## 2021-09-30 DIAGNOSIS — Z7985 Long-term (current) use of injectable non-insulin antidiabetic drugs: Secondary | ICD-10-CM | POA: Diagnosis not present

## 2021-09-30 DIAGNOSIS — Z6834 Body mass index (BMI) 34.0-34.9, adult: Secondary | ICD-10-CM

## 2021-09-30 DIAGNOSIS — N898 Other specified noninflammatory disorders of vagina: Secondary | ICD-10-CM

## 2021-09-30 DIAGNOSIS — Z79810 Long term (current) use of selective estrogen receptor modulators (SERMs): Secondary | ICD-10-CM | POA: Diagnosis not present

## 2021-09-30 DIAGNOSIS — I152 Hypertension secondary to endocrine disorders: Secondary | ICD-10-CM

## 2021-09-30 DIAGNOSIS — Z923 Personal history of irradiation: Secondary | ICD-10-CM | POA: Diagnosis not present

## 2021-09-30 DIAGNOSIS — Z79899 Other long term (current) drug therapy: Secondary | ICD-10-CM | POA: Diagnosis not present

## 2021-09-30 DIAGNOSIS — Z510 Encounter for antineoplastic radiation therapy: Secondary | ICD-10-CM | POA: Diagnosis not present

## 2021-09-30 DIAGNOSIS — C50412 Malignant neoplasm of upper-outer quadrant of left female breast: Secondary | ICD-10-CM | POA: Diagnosis not present

## 2021-09-30 DIAGNOSIS — E1159 Type 2 diabetes mellitus with other circulatory complications: Secondary | ICD-10-CM

## 2021-09-30 DIAGNOSIS — E669 Obesity, unspecified: Secondary | ICD-10-CM | POA: Diagnosis not present

## 2021-09-30 DIAGNOSIS — Z793 Long term (current) use of hormonal contraceptives: Secondary | ICD-10-CM | POA: Diagnosis not present

## 2021-09-30 DIAGNOSIS — E1169 Type 2 diabetes mellitus with other specified complication: Secondary | ICD-10-CM | POA: Diagnosis not present

## 2021-09-30 LAB — RAD ONC ARIA SESSION SUMMARY
Course Elapsed Days: 20
Plan Fractions Treated to Date: 14
Plan Prescribed Dose Per Fraction: 2.67 Gy
Plan Total Fractions Prescribed: 15
Plan Total Prescribed Dose: 40.05 Gy
Reference Point Dosage Given to Date: 37.38 Gy
Reference Point Session Dosage Given: 2.67 Gy
Session Number: 14

## 2021-09-30 NOTE — Telephone Encounter (Signed)
FYI

## 2021-10-01 ENCOUNTER — Other Ambulatory Visit: Payer: Self-pay

## 2021-10-01 ENCOUNTER — Ambulatory Visit
Admission: RE | Admit: 2021-10-01 | Discharge: 2021-10-01 | Disposition: A | Discharge status: Home / Self Care | Payer: 59 | Source: Ambulatory Visit | Attending: Radiation Oncology | Admitting: Radiation Oncology

## 2021-10-01 DIAGNOSIS — Z170 Estrogen receptor positive status [ER+]: Secondary | ICD-10-CM | POA: Diagnosis not present

## 2021-10-01 DIAGNOSIS — Z510 Encounter for antineoplastic radiation therapy: Secondary | ICD-10-CM | POA: Diagnosis not present

## 2021-10-01 DIAGNOSIS — Z79810 Long term (current) use of selective estrogen receptor modulators (SERMs): Secondary | ICD-10-CM | POA: Diagnosis not present

## 2021-10-01 DIAGNOSIS — C50412 Malignant neoplasm of upper-outer quadrant of left female breast: Secondary | ICD-10-CM | POA: Diagnosis not present

## 2021-10-01 DIAGNOSIS — Z793 Long term (current) use of hormonal contraceptives: Secondary | ICD-10-CM | POA: Diagnosis not present

## 2021-10-01 DIAGNOSIS — Z923 Personal history of irradiation: Secondary | ICD-10-CM | POA: Diagnosis not present

## 2021-10-01 DIAGNOSIS — Z79899 Other long term (current) drug therapy: Secondary | ICD-10-CM | POA: Diagnosis not present

## 2021-10-01 LAB — RAD ONC ARIA SESSION SUMMARY
Course Elapsed Days: 21
Plan Fractions Treated to Date: 15
Plan Prescribed Dose Per Fraction: 2.67 Gy
Plan Total Fractions Prescribed: 15
Plan Total Prescribed Dose: 40.05 Gy
Reference Point Dosage Given to Date: 40.05 Gy
Reference Point Session Dosage Given: 2.67 Gy
Session Number: 15

## 2021-10-02 ENCOUNTER — Other Ambulatory Visit: Payer: Self-pay

## 2021-10-02 ENCOUNTER — Ambulatory Visit: Payer: 59

## 2021-10-02 ENCOUNTER — Other Ambulatory Visit (HOSPITAL_COMMUNITY): Payer: Self-pay

## 2021-10-02 DIAGNOSIS — Z793 Long term (current) use of hormonal contraceptives: Secondary | ICD-10-CM | POA: Diagnosis not present

## 2021-10-02 DIAGNOSIS — C50412 Malignant neoplasm of upper-outer quadrant of left female breast: Secondary | ICD-10-CM | POA: Diagnosis not present

## 2021-10-02 DIAGNOSIS — Z923 Personal history of irradiation: Secondary | ICD-10-CM | POA: Diagnosis not present

## 2021-10-02 DIAGNOSIS — Z170 Estrogen receptor positive status [ER+]: Secondary | ICD-10-CM | POA: Diagnosis not present

## 2021-10-02 DIAGNOSIS — Z79899 Other long term (current) drug therapy: Secondary | ICD-10-CM | POA: Diagnosis not present

## 2021-10-02 DIAGNOSIS — Z79810 Long term (current) use of selective estrogen receptor modulators (SERMs): Secondary | ICD-10-CM | POA: Diagnosis not present

## 2021-10-02 DIAGNOSIS — Z510 Encounter for antineoplastic radiation therapy: Secondary | ICD-10-CM | POA: Diagnosis not present

## 2021-10-02 LAB — RAD ONC ARIA SESSION SUMMARY
Course Elapsed Days: 22
Plan Fractions Treated to Date: 1
Plan Prescribed Dose Per Fraction: 2 Gy
Plan Total Fractions Prescribed: 5
Plan Total Prescribed Dose: 10 Gy
Reference Point Dosage Given to Date: 42.05 Gy
Reference Point Session Dosage Given: 2 Gy
Session Number: 16

## 2021-10-03 ENCOUNTER — Other Ambulatory Visit (HOSPITAL_COMMUNITY): Payer: Self-pay

## 2021-10-03 ENCOUNTER — Other Ambulatory Visit: Payer: Self-pay

## 2021-10-03 ENCOUNTER — Ambulatory Visit: Payer: 59

## 2021-10-03 DIAGNOSIS — Z923 Personal history of irradiation: Secondary | ICD-10-CM | POA: Diagnosis not present

## 2021-10-03 DIAGNOSIS — C50412 Malignant neoplasm of upper-outer quadrant of left female breast: Secondary | ICD-10-CM | POA: Diagnosis not present

## 2021-10-03 DIAGNOSIS — Z170 Estrogen receptor positive status [ER+]: Secondary | ICD-10-CM | POA: Diagnosis not present

## 2021-10-03 DIAGNOSIS — Z79810 Long term (current) use of selective estrogen receptor modulators (SERMs): Secondary | ICD-10-CM | POA: Diagnosis not present

## 2021-10-03 DIAGNOSIS — Z510 Encounter for antineoplastic radiation therapy: Secondary | ICD-10-CM | POA: Diagnosis not present

## 2021-10-03 DIAGNOSIS — Z79899 Other long term (current) drug therapy: Secondary | ICD-10-CM | POA: Diagnosis not present

## 2021-10-03 DIAGNOSIS — Z793 Long term (current) use of hormonal contraceptives: Secondary | ICD-10-CM | POA: Diagnosis not present

## 2021-10-03 LAB — RAD ONC ARIA SESSION SUMMARY
Course Elapsed Days: 23
Plan Fractions Treated to Date: 2
Plan Prescribed Dose Per Fraction: 2 Gy
Plan Total Fractions Prescribed: 5
Plan Total Prescribed Dose: 10 Gy
Reference Point Dosage Given to Date: 44.05 Gy
Reference Point Session Dosage Given: 2 Gy
Session Number: 17

## 2021-10-06 ENCOUNTER — Other Ambulatory Visit (INDEPENDENT_AMBULATORY_CARE_PROVIDER_SITE_OTHER): Payer: Self-pay | Admitting: Family Medicine

## 2021-10-06 ENCOUNTER — Other Ambulatory Visit: Payer: Self-pay

## 2021-10-06 ENCOUNTER — Ambulatory Visit
Admission: RE | Admit: 2021-10-06 | Discharge: 2021-10-06 | Disposition: A | Discharge status: Home / Self Care | Payer: 59 | Source: Ambulatory Visit | Attending: Radiation Oncology | Admitting: Radiation Oncology

## 2021-10-06 ENCOUNTER — Ambulatory Visit: Payer: 59

## 2021-10-06 ENCOUNTER — Encounter (INDEPENDENT_AMBULATORY_CARE_PROVIDER_SITE_OTHER): Payer: Self-pay | Admitting: Family Medicine

## 2021-10-06 ENCOUNTER — Other Ambulatory Visit (HOSPITAL_COMMUNITY): Payer: Self-pay

## 2021-10-06 DIAGNOSIS — Z170 Estrogen receptor positive status [ER+]: Secondary | ICD-10-CM | POA: Diagnosis not present

## 2021-10-06 DIAGNOSIS — Z79899 Other long term (current) drug therapy: Secondary | ICD-10-CM | POA: Diagnosis not present

## 2021-10-06 DIAGNOSIS — Z923 Personal history of irradiation: Secondary | ICD-10-CM | POA: Diagnosis not present

## 2021-10-06 DIAGNOSIS — Z79810 Long term (current) use of selective estrogen receptor modulators (SERMs): Secondary | ICD-10-CM | POA: Diagnosis not present

## 2021-10-06 DIAGNOSIS — C50412 Malignant neoplasm of upper-outer quadrant of left female breast: Secondary | ICD-10-CM | POA: Diagnosis not present

## 2021-10-06 DIAGNOSIS — E1169 Type 2 diabetes mellitus with other specified complication: Secondary | ICD-10-CM

## 2021-10-06 DIAGNOSIS — Z793 Long term (current) use of hormonal contraceptives: Secondary | ICD-10-CM | POA: Diagnosis not present

## 2021-10-06 DIAGNOSIS — Z510 Encounter for antineoplastic radiation therapy: Secondary | ICD-10-CM | POA: Diagnosis not present

## 2021-10-06 LAB — RAD ONC ARIA SESSION SUMMARY
Course Elapsed Days: 26
Plan Fractions Treated to Date: 3
Plan Prescribed Dose Per Fraction: 2 Gy
Plan Total Fractions Prescribed: 5
Plan Total Prescribed Dose: 10 Gy
Reference Point Dosage Given to Date: 46.05 Gy
Reference Point Session Dosage Given: 2 Gy
Session Number: 18

## 2021-10-06 MED ORDER — TIRZEPATIDE 12.5 MG/0.5ML ~~LOC~~ SOAJ
12.5000 mg | SUBCUTANEOUS | 0 refills | Status: DC
  Filled 2021-10-06: qty 2, 28d supply, fill #0

## 2021-10-07 ENCOUNTER — Other Ambulatory Visit (HOSPITAL_COMMUNITY): Payer: Self-pay

## 2021-10-07 ENCOUNTER — Inpatient Hospital Stay: Payer: 59 | Attending: Hematology and Oncology | Admitting: Hematology and Oncology

## 2021-10-07 ENCOUNTER — Other Ambulatory Visit: Payer: Self-pay

## 2021-10-07 ENCOUNTER — Ambulatory Visit: Payer: 59

## 2021-10-07 ENCOUNTER — Ambulatory Visit
Admission: RE | Admit: 2021-10-07 | Discharge: 2021-10-07 | Disposition: A | Discharge status: Home / Self Care | Payer: 59 | Source: Ambulatory Visit | Attending: Radiation Oncology | Admitting: Radiation Oncology

## 2021-10-07 DIAGNOSIS — Z510 Encounter for antineoplastic radiation therapy: Secondary | ICD-10-CM | POA: Diagnosis not present

## 2021-10-07 DIAGNOSIS — Z170 Estrogen receptor positive status [ER+]: Secondary | ICD-10-CM | POA: Insufficient documentation

## 2021-10-07 DIAGNOSIS — Z79899 Other long term (current) drug therapy: Secondary | ICD-10-CM | POA: Diagnosis not present

## 2021-10-07 DIAGNOSIS — Z793 Long term (current) use of hormonal contraceptives: Secondary | ICD-10-CM | POA: Diagnosis not present

## 2021-10-07 DIAGNOSIS — Z79810 Long term (current) use of selective estrogen receptor modulators (SERMs): Secondary | ICD-10-CM | POA: Diagnosis not present

## 2021-10-07 DIAGNOSIS — C50412 Malignant neoplasm of upper-outer quadrant of left female breast: Secondary | ICD-10-CM | POA: Diagnosis not present

## 2021-10-07 DIAGNOSIS — Z923 Personal history of irradiation: Secondary | ICD-10-CM | POA: Diagnosis not present

## 2021-10-07 LAB — RAD ONC ARIA SESSION SUMMARY
Course Elapsed Days: 27
Plan Fractions Treated to Date: 4
Plan Prescribed Dose Per Fraction: 2 Gy
Plan Total Fractions Prescribed: 5
Plan Total Prescribed Dose: 10 Gy
Reference Point Dosage Given to Date: 48.05 Gy
Reference Point Session Dosage Given: 2 Gy
Session Number: 19

## 2021-10-07 MED ORDER — TAMOXIFEN CITRATE 20 MG PO TABS
20.0000 mg | ORAL_TABLET | Freq: Every day | ORAL | 3 refills | Status: DC
  Filled 2021-10-07: qty 90, 90d supply, fill #0
  Filled 2022-01-29: qty 90, 90d supply, fill #1
  Filled 2022-04-29: qty 90, 90d supply, fill #2
  Filled 2022-08-03: qty 90, 90d supply, fill #3

## 2021-10-07 NOTE — Assessment & Plan Note (Addendum)
07/28/2021: Left lumpectomy: Grade 2 ILC 3.5 cm with LCIS, intraductal papilloma with UBH, margins negative, 1 lymph node with isolated tumor cells, ER 90%, PR 80%, HER2 negative, Ki-67 4% Oncotype score 14: Distant recurrence at 9 years: 4% 09/11/2021-10/08/2021 Adjuvant radiation:   Current treatment: adjuvant antiestrogen therapy with tamoxifen 20 mg daily x10 years (she will start at 10 mg a day for the first month and then increase it to 20 if she tolerates it well)   Tamoxifen counseling: We discussed the risks and benefits of tamoxifen. These include but not limited to insomnia, hot flashes, mood changes, vaginal dryness, and weight gain. Although rare, serious side effects including endometrial cancer, risk of blood clots were also discussed. We strongly believe that the benefits far outweigh the risks. Patient understands these risks and consented to starting treatment. Planned treatment duration is 10 years.  FSH 25, estradiol 20: Patient is perimenopausal.  She will recheck these labs with her primary care physician in 6 months. We will continue with tamoxifen until she becomes menopausal.  She currently has Mirena and will keep it until she is in menopause and at that time she will remove it.  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 Return to clinic  in 3 months for survivorship care plan visit 

## 2021-10-08 ENCOUNTER — Ambulatory Visit
Admission: RE | Admit: 2021-10-08 | Discharge: 2021-10-08 | Disposition: A | Discharge status: Home / Self Care | Payer: 59 | Source: Ambulatory Visit | Attending: Radiation Oncology | Admitting: Radiation Oncology

## 2021-10-08 ENCOUNTER — Encounter: Payer: Self-pay | Admitting: Radiation Oncology

## 2021-10-08 ENCOUNTER — Other Ambulatory Visit: Payer: Self-pay

## 2021-10-08 ENCOUNTER — Encounter: Payer: Self-pay | Admitting: *Deleted

## 2021-10-08 ENCOUNTER — Ambulatory Visit: Payer: 59

## 2021-10-08 DIAGNOSIS — C50412 Malignant neoplasm of upper-outer quadrant of left female breast: Secondary | ICD-10-CM | POA: Diagnosis not present

## 2021-10-08 DIAGNOSIS — Z510 Encounter for antineoplastic radiation therapy: Secondary | ICD-10-CM | POA: Diagnosis not present

## 2021-10-08 DIAGNOSIS — Z170 Estrogen receptor positive status [ER+]: Secondary | ICD-10-CM

## 2021-10-08 DIAGNOSIS — Z793 Long term (current) use of hormonal contraceptives: Secondary | ICD-10-CM | POA: Diagnosis not present

## 2021-10-08 DIAGNOSIS — Z923 Personal history of irradiation: Secondary | ICD-10-CM | POA: Diagnosis not present

## 2021-10-08 DIAGNOSIS — Z79810 Long term (current) use of selective estrogen receptor modulators (SERMs): Secondary | ICD-10-CM | POA: Diagnosis not present

## 2021-10-08 DIAGNOSIS — Z79899 Other long term (current) drug therapy: Secondary | ICD-10-CM | POA: Diagnosis not present

## 2021-10-08 LAB — RAD ONC ARIA SESSION SUMMARY
Course Elapsed Days: 28
Plan Fractions Treated to Date: 5
Plan Prescribed Dose Per Fraction: 2 Gy
Plan Total Fractions Prescribed: 5
Plan Total Prescribed Dose: 10 Gy
Reference Point Dosage Given to Date: 50.05 Gy
Reference Point Session Dosage Given: 2 Gy
Session Number: 20

## 2021-10-09 ENCOUNTER — Ambulatory Visit: Payer: 59

## 2021-10-27 NOTE — Progress Notes (Addendum)
TeleHealth Visit:  This visit was completed with telemedicine (audio/video) technology. Elizabeth Cummings has verbally consented to this TeleHealth visit. The patient is located at home, the provider is located at home. The participants in this visit include the listed provider and patient. The visit was conducted today via MyChart video.  OBESITY Elizabeth Cummings is here to discuss her progress with her obesity treatment plan along with follow-up of her obesity related diagnoses.   Today's visit was # 13 Starting weight: 231 lbs Starting date: 11/14/20 Weight reported at last virtual office visit: 201 lbs Total weight loss: 32  lbs Today's reported weight: 199.6 lbs Weight change since last visit: -2 BP 113/69, Pulse 90.  Nutrition Plan: keeping a food journal and adhering to recommended goals of 1200-1300 calories and 90 gms protein or PC/[place]. Hunger is well controlled. Cravings are well controlled.  Current exercise: taking steps and walking 14 minutes/day. 10000-12000 steps/day on workdays. Interim History: Elizabeth Cummings is very happy with her slow steady weight loss.  She is not currently journaling but focuses in on making healthy choices and increasing her protein.  She has protein shake or oatmeal for breakfast.  Goes to cafeteria for lunch at work but makes a good choice.  She does tend to eat out in the evenings but  makes healthy choices. Discussed briefly packing her lunch for work to have more control over options and she may consider this. She finished her radiation treatments on July 19 and is now finished with treatment other than taking tamoxifen. She is headed to [place] next week for a week of vacation.  Assessment/Plan:  1. Type II Diabetes HgbA1c is at goal. Last A1c was 6.3 Medication(s): Mounjaro 15 mg weekly.  She has had trouble getting the 15 mg dose so we prescribed 12.5 mg and then her PCP gave her 2.5 mg samples so she has been doing 2 injections/week to make 15  mg.  Lab Results  Component Value Date   HGBA1C 6.3 (H) 06/12/2021   HGBA1C 6.6 03/14/2021   HGBA1C 6.4 (H) 11/14/2020   Lab Results  Component Value Date   LDLCALC 86 03/14/2021   CREATININE 0.70 07/25/2021    Plan: Refill Mounjaro 15 mg weekly.   2. Hypertension associated with type 2 diabetes. Hypertension well controlled.  BP 113/69 today. Medication(s): HCTZ 25 mg daily. Denies chest pain and SOB.  BP Readings from Last 3 Encounters:  10/28/21 113/69  10/07/21 119/78  09/30/21 116/81   Lab Results  Component Value Date   CREATININE 0.70 07/25/2021   CREATININE 0.7 03/14/2021   CREATININE 0.66 11/14/2020    Plan: Continue HCTZ 25 mg daily.   3. Obesity: Current BMI 34.1 Elizabeth Cummings is currently in the action stage of change. As such, her goal is to continue with weight loss efforts.  She has agreed to practicing portion control and making smarter food choices, such as increasing vegetables and decreasing simple carbohydrates.   Exercise goals: as is  Behavioral modification strategies: increasing lean protein intake and decreasing simple carbohydrates.  Elizabeth Cummings has agreed to follow-up with our clinic in 4 weeks.   No orders of the defined types were placed in this encounter.   Medications Discontinued During This Encounter  Medication Reason   tirzepatide (MOUNJARO) 12.5 MG/0.5ML Pen Dose change     Meds ordered this encounter  Medications   tirzepatide (MOUNJARO) 15 MG/0.5ML Pen    Sig: Inject 15 mg into the skin once a week.    Dispense:  2  mL    Refill:  0    Order Specific Question:   Supervising Provider    Answer:   Quillian Quince D [[id number]]      Objective:   VITALS: Per patient if applicable, see vitals. GENERAL: Alert and in no acute distress. CARDIOPULMONARY: No increased WOB. Speaking in clear sentences.  PSYCH: Pleasant and cooperative. Speech normal rate and rhythm. Affect is appropriate. Insight and judgement are appropriate.  Attention is focused, linear, and appropriate.  NEURO: Oriented as arrived to appointment on time with no prompting.   Lab Results  Component Value Date   CREATININE 0.70 07/25/2021   BUN 10 07/25/2021   NA 138 07/25/2021   K 3.3 (L) 07/25/2021   CL 101 07/25/2021   CO2 27 07/25/2021   Lab Results  Component Value Date   ALT 17 03/14/2021   AST 17 03/14/2021   ALKPHOS 97 03/14/2021   BILITOT 0.3 11/14/2020   Lab Results  Component Value Date   HGBA1C 6.3 (H) 06/12/2021   HGBA1C 6.6 03/14/2021   HGBA1C 6.4 (H) 11/14/2020   HGBA1C 6.4 12/27/2014   HGBA1C 6.3 09/06/2014   Lab Results  Component Value Date   INSULIN 20.3 11/14/2020   Lab Results  Component Value Date   TSH 0.785 11/14/2020   Lab Results  Component Value Date   CHOL 156 03/14/2021   HDL 49 03/14/2021   LDLCALC 86 03/14/2021   TRIG 121 03/14/2021   Lab Results  Component Value Date   WBC 13.2 03/14/2021   HGB 12.7 03/14/2021   HCT 38 03/14/2021   MCV 83 11/14/2020   PLT 419 (A) 03/14/2021   Lab Results  Component Value Date   IRON 58 05/08/2014   TIBC 506 (H) 05/08/2014   FERRITIN 36 05/08/2014   Lab Results  Component Value Date   VD25OH 50.3 06/12/2021   VD25OH 50.9 11/14/2020    Attestation Statements:   Reviewed by clinician on day of visit: allergies, medications, problem list, medical history, surgical history, family history, social history, and previous encounter notes.

## 2021-10-28 ENCOUNTER — Encounter (INDEPENDENT_AMBULATORY_CARE_PROVIDER_SITE_OTHER): Payer: Self-pay | Admitting: Family Medicine

## 2021-10-28 ENCOUNTER — Other Ambulatory Visit (HOSPITAL_COMMUNITY): Payer: Self-pay

## 2021-10-28 ENCOUNTER — Telehealth (INDEPENDENT_AMBULATORY_CARE_PROVIDER_SITE_OTHER): Payer: 59 | Admitting: Family Medicine

## 2021-10-28 VITALS — BP 113/69 | Ht 64.0 in | Wt 199.0 lb

## 2021-10-28 DIAGNOSIS — E1159 Type 2 diabetes mellitus with other circulatory complications: Secondary | ICD-10-CM

## 2021-10-28 DIAGNOSIS — E669 Obesity, unspecified: Secondary | ICD-10-CM

## 2021-10-28 DIAGNOSIS — Z6834 Body mass index (BMI) 34.0-34.9, adult: Secondary | ICD-10-CM | POA: Diagnosis not present

## 2021-10-28 DIAGNOSIS — E1169 Type 2 diabetes mellitus with other specified complication: Secondary | ICD-10-CM | POA: Diagnosis not present

## 2021-10-28 DIAGNOSIS — Z7985 Long-term (current) use of injectable non-insulin antidiabetic drugs: Secondary | ICD-10-CM | POA: Diagnosis not present

## 2021-10-28 DIAGNOSIS — I152 Hypertension secondary to endocrine disorders: Secondary | ICD-10-CM | POA: Diagnosis not present

## 2021-10-28 MED ORDER — TIRZEPATIDE 15 MG/0.5ML ~~LOC~~ SOAJ
15.0000 mg | SUBCUTANEOUS | 0 refills | Status: DC
  Filled 2021-10-28: qty 2, 28d supply, fill #0

## 2021-10-29 ENCOUNTER — Encounter (INDEPENDENT_AMBULATORY_CARE_PROVIDER_SITE_OTHER): Payer: Self-pay

## 2021-10-29 ENCOUNTER — Other Ambulatory Visit (HOSPITAL_COMMUNITY): Payer: Self-pay

## 2021-10-29 MED ORDER — HYDROCHLOROTHIAZIDE 25 MG PO TABS
25.0000 mg | ORAL_TABLET | Freq: Every day | ORAL | 1 refills | Status: DC
  Filled 2021-10-29: qty 90, 90d supply, fill #0
  Filled 2022-01-29: qty 90, 90d supply, fill #1

## 2021-11-10 NOTE — Progress Notes (Signed)
Elizabeth Cummings  had a telephone visit today for follow-up after completing radiation to her left breast on 7/19/203  Pain:None Skin: No issues ROM: no issues Lymphedema:  no issues  MedOnc F/U:  Other issues of note: None  Pt reports Yes No Comments  Tamoxifen [x]  []    Letrozole []  []    Anastrazole []  []    Mammogram []  Date:  []    States that she started Tamoxifen on 10/21/2021 10 mg and will increase her dose to 20 mg on September 1

## 2021-11-12 NOTE — Progress Notes (Signed)
                                                                                                                                                             Patient Name: Elizabeth Cummings MRN: [id number] DOB: [birth year] Referring Physician: Mayra Neer (Profile Not Attached) Date of Service: 10/08/2021 [facility], [place]                                                        End Of Treatment Note  Diagnoses: C50.412-Malignant neoplasm of upper-outer quadrant of left female breast  Cancer Staging:  Cancer Staging  Malignant neoplasm of upper-outer quadrant of left breast in female, estrogen receptor positive ([place]) Staging form: Breast, AJCC 8th Edition - Clinical: Stage Unknown (cTX, cN0, cM0, G1, ER+, PR+, HER2-) - Signed by Nicholas Lose, MD on 07/10/2021 Stage prefix: Initial diagnosis Histologic grading system: 3 grade system - Pathologic: Stage IA (pT2, pN0(i+), cM0, G2, ER+, PR+, HER2-) - Signed by Nicholas Lose, MD on 08/05/2021 Multigene prognostic tests performed: Oncotype DX Histologic grading system: 3 grade system  Intent: Curative  Radiation Treatment Dates: 09/10/2021 through 10/08/2021 Site Technique Total Dose (Gy) Dose per Fx (Gy) Completed Fx Beam Energies  Breast, Left: Breast_L_Axilla 3D 40.05/40.05 2.67 15/15 10X  Breast, Left: Breast_L_Bst 3D 10/10 2 5/5 6X, 10X   Narrative: The patient tolerated radiation therapy relatively well.   Plan: The patient will follow-up with radiation oncology in 40mo  -----------------------------------  SEppie Gibson MD

## 2021-11-14 ENCOUNTER — Ambulatory Visit: Payer: Self-pay | Admitting: Radiation Oncology

## 2021-11-18 ENCOUNTER — Telehealth: Payer: Self-pay | Admitting: Emergency Medicine

## 2021-11-18 ENCOUNTER — Ambulatory Visit
Admission: RE | Admit: 2021-11-18 | Discharge: 2021-11-18 | Disposition: A | Discharge status: Home / Self Care | Payer: 59 | Source: Ambulatory Visit | Attending: Radiation Oncology | Admitting: Radiation Oncology

## 2021-11-18 DIAGNOSIS — C50412 Malignant neoplasm of upper-outer quadrant of left female breast: Secondary | ICD-10-CM

## 2021-11-25 NOTE — Progress Notes (Signed)
TeleHealth Visit:  This visit was completed with telemedicine (audio/video) technology. Elizabeth Cummings has verbally consented to this TeleHealth visit. The patient is located at home, the provider is located at home. The participants in this visit include the listed provider and patient. The visit was conducted today via MyChart video.  OBESITY Elizabeth Cummings is here to discuss her progress with her obesity treatment plan along with follow-up of her obesity related diagnoses.   Today's visit was # 14  Starting weight: 231 lbs Starting date: 11/14/20 Weight reported at last virtual office visit: 199.6 lbs on 10/28/21 Today's reported weight: 198 lbs  Total weight loss: 33 Weight change since last visit: -1.6  Nutrition Plan: practicing portion control and making smarter food choices, such as increasing vegetables and decreasing simple carbohydrates.  Hunger is well controlled. Cravings are well controlled.  Current exercise: taking steps and walking 14 minutes/day. 10000-12000 steps/day on workdays.  Interim History: Elizabeth Cummings is doing well and is down about a pound and a half since her last visit.  She has started journaling some and feels this has been beneficial.  She is focusing on protein intake.  She does not drink any sugar sweetened beverages.   She is incorporating a good amount of physical activity into her day - 10000-20000 steps at work and taking stairs instead of elevator.  Assessment/Plan:  1. Type II Diabetes HgbA1c is at goal. Last A1c was 6.3 on 06/12/21 Medication(s): Mounjaro 15 mg weekly.  Appetite well controlled.  Diabetes well controlled.  Lab Results  Component Value Date   HGBA1C 6.3 (H) 06/12/2021   HGBA1C 6.6 03/14/2021   HGBA1C 6.4 (H) 11/14/2020   Lab Results  Component Value Date   LDLCALC 86 03/14/2021   CREATININE 0.70 07/25/2021    Plan: Refill Mounjaro 15 mg weekly.   2.  Malignant neoplasm of upper outer quadrant of left breast, estrogen  receptor positive. [name] finished radiation in August for breast cancer.  She also had a lumpectomy and sentinel lymph node removal.  She is currently taking estrogen blocker therapy which is causing a lot of hot flashes.  Plan: Follow-up with oncology as directed.  She has some questions about surveillance and I encouraged her to ask Lillard Anes, NP at her scheduled survivorship visit.  3. Obesity: Current BMI 33.9 Elizabeth Cummings is currently in the action stage of change. As such, her goal is to continue with weight loss efforts.  She has agreed to practicing portion control and making smarter food choices, such as increasing vegetables and decreasing simple carbohydrates and keeping a food journal and adhering to recommended goals of 1200-1300 calories and 90 gms protein.  Exercise goals: as is  Behavioral modification strategies: increasing lean protein intake, decreasing simple carbohydrates, and planning for success.  Elizabeth Cummings has agreed to follow-up with our clinic in 4 weeks.   No orders of the defined types were placed in this encounter.   Medications Discontinued During This Encounter  Medication Reason   tirzepatide [facility]) 15 MG/0.5ML Pen Reorder     Meds ordered this encounter  Medications   tirzepatide (MOUNJARO) 15 MG/0.5ML Pen    Sig: Inject 15 mg into the skin once a week.    Dispense:  2 mL    Refill:  0    Order Specific Question:   Supervising Provider    Answer:   Quillian Quince D [[id number]]      Objective:   VITALS: Per patient if applicable, see vitals. GENERAL: Alert and in no acute  distress. CARDIOPULMONARY: No increased WOB. Speaking in clear sentences.  PSYCH: Pleasant and cooperative. Speech normal rate and rhythm. Affect is appropriate. Insight and judgement are appropriate. Attention is focused, linear, and appropriate.  NEURO: Oriented as arrived to appointment on time with no prompting.   Lab Results  Component Value Date   CREATININE 0.70  07/25/2021   BUN 10 07/25/2021   NA 138 07/25/2021   K 3.3 (L) 07/25/2021   CL 101 07/25/2021   CO2 27 07/25/2021   Lab Results  Component Value Date   ALT 17 03/14/2021   AST 17 03/14/2021   ALKPHOS 97 03/14/2021   BILITOT 0.3 11/14/2020   Lab Results  Component Value Date   HGBA1C 6.3 (H) 06/12/2021   HGBA1C 6.6 03/14/2021   HGBA1C 6.4 (H) 11/14/2020   HGBA1C 6.4 12/27/2014   HGBA1C 6.3 09/06/2014   Lab Results  Component Value Date   INSULIN 20.3 11/14/2020   Lab Results  Component Value Date   TSH 0.785 11/14/2020   Lab Results  Component Value Date   CHOL 156 03/14/2021   HDL 49 03/14/2021   LDLCALC 86 03/14/2021   TRIG 121 03/14/2021   Lab Results  Component Value Date   WBC 13.2 03/14/2021   HGB 12.7 03/14/2021   HCT 38 03/14/2021   MCV 83 11/14/2020   PLT 419 (A) 03/14/2021   Lab Results  Component Value Date   IRON 58 05/08/2014   TIBC 506 (H) 05/08/2014   FERRITIN 36 05/08/2014   Lab Results  Component Value Date   VD25OH 50.3 06/12/2021   VD25OH 50.9 11/14/2020    Attestation Statements:   Reviewed by clinician on day of visit: allergies, medications, problem list, medical history, surgical history, family history, social history, and previous encounter notes.

## 2021-11-26 ENCOUNTER — Telehealth (INDEPENDENT_AMBULATORY_CARE_PROVIDER_SITE_OTHER): Payer: 59 | Admitting: Family Medicine

## 2021-11-26 ENCOUNTER — Encounter (INDEPENDENT_AMBULATORY_CARE_PROVIDER_SITE_OTHER): Payer: Self-pay | Admitting: Family Medicine

## 2021-11-26 ENCOUNTER — Other Ambulatory Visit (HOSPITAL_COMMUNITY): Payer: Self-pay

## 2021-11-26 VITALS — BP 121/81 | HR 96 | Resp 16 | Ht 64.0 in | Wt 198.0 lb

## 2021-11-26 DIAGNOSIS — E1169 Type 2 diabetes mellitus with other specified complication: Secondary | ICD-10-CM

## 2021-11-26 DIAGNOSIS — Z170 Estrogen receptor positive status [ER+]: Secondary | ICD-10-CM | POA: Diagnosis not present

## 2021-11-26 DIAGNOSIS — C50412 Malignant neoplasm of upper-outer quadrant of left female breast: Secondary | ICD-10-CM | POA: Diagnosis not present

## 2021-11-26 DIAGNOSIS — E669 Obesity, unspecified: Secondary | ICD-10-CM | POA: Diagnosis not present

## 2021-11-26 DIAGNOSIS — Z7985 Long-term (current) use of injectable non-insulin antidiabetic drugs: Secondary | ICD-10-CM

## 2021-11-26 DIAGNOSIS — Z6833 Body mass index (BMI) 33.0-33.9, adult: Secondary | ICD-10-CM | POA: Diagnosis not present

## 2021-11-26 MED ORDER — TIRZEPATIDE 15 MG/0.5ML ~~LOC~~ SOAJ
15.0000 mg | SUBCUTANEOUS | 0 refills | Status: DC
  Filled 2021-11-26: qty 2, 28d supply, fill #0

## 2021-12-01 ENCOUNTER — Ambulatory Visit: Payer: 59 | Attending: General Surgery

## 2021-12-01 VITALS — Wt 199.1 lb

## 2021-12-01 DIAGNOSIS — Z483 Aftercare following surgery for neoplasm: Secondary | ICD-10-CM | POA: Insufficient documentation

## 2021-12-01 NOTE — Therapy (Signed)
  OUTPATIENT PHYSICAL THERAPY SOZO SCREENING NOTE   Patient Name: Elizabeth Cummings MRN: [id number] DOB:1975/11/10, 46 y.o., female Today's Date: 12/01/2021  PCP: Mayra Neer, MD REFERRING PROVIDER: Rolm Bookbinder, MD   PT End of Session - 12/01/21 1545     Visit Number 2   # unchanged due to screen only   PT Start Time 62    PT Stop Time 1547    PT Time Calculation (min) 4 min    Activity Tolerance Patient tolerated treatment well    Behavior During Therapy [facility] for tasks assessed/performed             Past Medical History:  Diagnosis Date   Allergy    seasonal, dogs, cats   Angioneurotic edema 05/08/2014   Cancer ([place]) 07/24/2021   Breast   Diabetes mellitus without complication ([place])    Leukocytosis 05/08/2014   Microcytic anemia 05/08/2014   Thrombocytosis 05/08/2014   Past Surgical History:  Procedure Laterality Date   APPENDECTOMY     BREAST BIOPSY Left 07/24/2021   MRI   BREAST BIOPSY Left 06/27/2021   U/S   BREAST LUMPECTOMY WITH RADIOACTIVE SEED AND SENTINEL LYMPH NODE BIOPSY Left 07/28/2021   Procedure: LEFT BREAST BRACKETED LUMPECTOMY WITH RADIOACTIVE SEED AND AXILLARY SENTINEL LYMPH NODE BIOPSY;  Surgeon: Rolm Bookbinder, MD;  Location: [facility];  Service: General;  Laterality: Left;   [place]     WRIST SURGERY     Patient Active Problem List   Diagnosis Date Noted   Genetic testing 07/25/2021   Malignant neoplasm of upper-outer quadrant of left breast in female, estrogen receptor positive ([place]) 07/10/2021   Hypertension associated with diabetes ([place]) 07/01/2021   Migraine without status migrainosus, not intractable 04/02/2021   Hyperlipidemia associated with type 2 diabetes mellitus ([place]) 01/28/2021   Diabetes mellitus ([place]) 01/01/2021   Class 2 severe obesity with serious comorbidity and body mass index (BMI) of 36.0 to 36.9 in adult [facility]) 01/01/2021   Celiac disease 07/31/2019    Prediabetes 03/02/2016   Microcytic anemia 05/08/2014   Leukocytosis 05/08/2014   Thrombocytosis 05/08/2014   Angioneurotic edema 05/08/2014    REFERRING DIAG: left breast cancer at risk for lymphedema  THERAPY DIAG:  Aftercare following surgery for neoplasm  PERTINENT HISTORY: Patient was diagnosed on 06/27/2021 with left grade 1 Invasive Lobular Carcinoma. It measured 4 mm and is located in the upper outer quadrant. It is ER+, PR+, HER 2- with a Ki67 of 4%. Pt had a left bracketed lumpectomy with axillary SLNB on 07/28/2021. She does not require chemotherapy,but will need radiation and antiestrogens  PRECAUTIONS: left UE Lymphedema risk, None  SUBJECTIVE: Pt returns for her 3 month L-Dex screen.   PAIN:  Are you having pain? No  SOZO SCREENING: Patient was assessed today using the SOZO machine to determine the lymphedema index score. This was compared to her baseline score. It was determined that she is within the recommended range when compared to her baseline and no further action is needed at this time. She will continue SOZO screenings. These are done every 3 months for 2 years post operatively followed by every 6 months for 2 years, and then annually.    Otelia Limes, PTA 12/01/2021, 3:47 PM

## 2021-12-18 ENCOUNTER — Encounter (INDEPENDENT_AMBULATORY_CARE_PROVIDER_SITE_OTHER): Payer: Self-pay | Admitting: Family Medicine

## 2021-12-22 ENCOUNTER — Other Ambulatory Visit (INDEPENDENT_AMBULATORY_CARE_PROVIDER_SITE_OTHER): Payer: Self-pay | Admitting: Family Medicine

## 2021-12-22 ENCOUNTER — Encounter (INDEPENDENT_AMBULATORY_CARE_PROVIDER_SITE_OTHER): Payer: Self-pay | Admitting: Family Medicine

## 2021-12-22 DIAGNOSIS — E1169 Type 2 diabetes mellitus with other specified complication: Secondary | ICD-10-CM

## 2021-12-22 DIAGNOSIS — D75839 Thrombocytosis, unspecified: Secondary | ICD-10-CM

## 2021-12-22 DIAGNOSIS — E559 Vitamin D deficiency, unspecified: Secondary | ICD-10-CM

## 2021-12-23 DIAGNOSIS — E785 Hyperlipidemia, unspecified: Secondary | ICD-10-CM | POA: Diagnosis not present

## 2021-12-23 DIAGNOSIS — E1169 Type 2 diabetes mellitus with other specified complication: Secondary | ICD-10-CM | POA: Diagnosis not present

## 2021-12-23 DIAGNOSIS — E559 Vitamin D deficiency, unspecified: Secondary | ICD-10-CM | POA: Diagnosis not present

## 2021-12-23 NOTE — Progress Notes (Unsigned)
TeleHealth Visit:  This visit was completed with telemedicine (audio/video) technology. Elizabeth Cummings has verbally consented to this TeleHealth visit. The patient is located at home, the provider is located at home. The participants in this visit include the listed provider and patient. The visit was conducted today via MyChart video.  OBESITY Elizabeth Cummings is here to discuss her progress with her obesity treatment plan along with follow-up of her obesity related diagnoses.    Today's visit was # 15  Starting weight: 231 lbs Starting date: 11/14/20 Weight reported at last virtual office visit: 198 lbs on 11/26/21 Today's reported weight: 191.6 lbs  Total weight loss: 40 Weight change since last visit: -7  Nutrition Plan: keeping a food journal and adhering to recommended goals of 1200-1300 calories and 90 gms protein and practicing portion control and making smarter food choices, such as increasing vegetables and decreasing simple carbohydrates daily  Current exercise: taking steps rather than elevator and walking 14 minutes/day. 10000-12000 steps/day on workdays.  Interim History: Elizabeth Cummings has lost 7 pounds since her last office visit.  She is journaling about half the time.  Protein is low at about 50 g/day.  She has a salad at lunch from the cafeteria but it only has some chopped ham on it. Appetite well controlled on 15 mg of Mounjaro.  Assessment/Plan:  We went over her recent labs in depth today. 1. Type II Diabetes HgbA1c is at goal. Last A1c was 6.1 on 12/23/2021, down from 6.3 on 06/12/2021. Episodes of hypoglycemia: no Medication(s): Mounjaro 15 mg weekly.  No side effects.  Lab Results  Component Value Date   HGBA1C 6.1 (H) 12/23/2021   HGBA1C 6.3 (H) 06/12/2021   HGBA1C 6.6 03/14/2021   Lab Results  Component Value Date   LDLCALC 90 12/23/2021   CREATININE 0.77 12/23/2021    Plan: Refill Mounjaro 15 mg weekly.   2. Hyperlipidemia associated with type 2  diabetes. LDL is not at goal.  LDL on 12/23/2021 was 90, triglycerides 150, HDL slightly low at 44. Medication(s): Pravastatin twice weekly.   Lab Results  Component Value Date   CHOL 160 12/23/2021   HDL 44 12/23/2021   LDLCALC 90 12/23/2021   TRIG 150 (H) 12/23/2021   Lab Results  Component Value Date   ALT 18 12/23/2021   AST 21 12/23/2021   ALKPHOS 88 12/23/2021   BILITOT 0.3 12/23/2021   The 10-year ASCVD risk score (Arnett DK, et al., 2019) is: 1.6%   Values used to calculate the score:     Age: 46 years     Sex: Female     Is Non-Hispanic African American: No     Diabetic: Yes     Tobacco smoker: No     Systolic Blood Pressure: 782 mmHg     Is BP treated: No     HDL Cholesterol: 44 mg/dL     Total Cholesterol: 160 mg/dL  Plan: Increase pravastatin to 3 times per week Refill pravastatin 20 mg 3 times weekly, Monday, Wednesday and Friday. Add in extra walking on the weekends.   3. Vitamin D Deficiency Vitamin D is not at goal of 50.  Last vitamin D was 41 on 12/23/2021. On MVI with D.  Lab Results  Component Value Date   VD25OH 41.1 12/23/2021   VD25OH 50.3 06/12/2021   VD25OH 50.9 11/14/2020    Plan: Add OTC vitamin D3 2000 IU daily. Continue MVI.   4. Obesity: Current BMI 32.8 Elizabeth Cummings is currently in the action stage  of change. As such, her goal is to continue with weight loss efforts.  She has agreed to keeping a food journal and adhering to recommended goals of 1200-1300 calories and 90 grams protein daily.  Exercise goals: Add an extra walking on the weekends.  Behavioral modification strategies: increasing lean protein intake, decreasing simple carbohydrates, planning for success, and keeping a strict food journal.  Elizabeth Cummings has agreed to follow-up with our clinic in 5 weeks.   No orders of the defined types were placed in this encounter.   Medications Discontinued During This Encounter  Medication Reason   pravastatin (PRAVACHOL) 20 MG  tablet Reorder   tirzepatide Darcel Bayley) 15 MG/0.5ML Pen Reorder     Meds ordered this encounter  Medications   tirzepatide (MOUNJARO) 15 MG/0.5ML Pen    Sig: Inject 15 mg into the skin once a week.    Dispense:  2 mL    Refill:  0    Order Specific Question:   Supervising Provider    Answer:   Dell Ponto [[id number]]   pravastatin (PRAVACHOL) 20 MG tablet    Sig: Take 1 tablet (20 mg total) by mouth 3 times a week on Monday, Wednesday, and Friday.    Dispense:  90 tablet    Refill:  0    Order Specific Question:   Supervising Provider    Answer:   Netty Starring   Cholecalciferol (VITAMIN D) 50 MCG (2000 UT) CAPS    Sig: Take 1 capsule (2,000 Units total) by mouth daily.    Dispense:  30 capsule    Refill:  0    Order Specific Question:   Supervising Provider    Answer:   Dell Ponto [[id number]]      Objective:   VITALS: Per patient if applicable, see vitals. GENERAL: Alert and in no acute distress. CARDIOPULMONARY: No increased WOB. Speaking in clear sentences.  PSYCH: Pleasant and cooperative. Speech normal rate and rhythm. Affect is appropriate. Insight and judgement are appropriate. Attention is focused, linear, and appropriate.  NEURO: Oriented as arrived to appointment on time with no prompting.   Lab Results  Component Value Date   CREATININE 0.77 12/23/2021   BUN 11 12/23/2021   NA 140 12/23/2021   K 3.5 12/23/2021   CL 97 12/23/2021   CO2 24 12/23/2021   Lab Results  Component Value Date   ALT 18 12/23/2021   AST 21 12/23/2021   ALKPHOS 88 12/23/2021   BILITOT 0.3 12/23/2021   Lab Results  Component Value Date   HGBA1C 6.1 (H) 12/23/2021   HGBA1C 6.3 (H) 06/12/2021   HGBA1C 6.6 03/14/2021   HGBA1C 6.4 (H) 11/14/2020   HGBA1C 6.4 12/27/2014   Lab Results  Component Value Date   INSULIN 20.3 11/14/2020   Lab Results  Component Value Date   TSH 0.785 11/14/2020   Lab Results  Component Value Date   CHOL 160 12/23/2021   HDL 44 12/23/2021    LDLCALC 90 12/23/2021   TRIG 150 (H) 12/23/2021   Lab Results  Component Value Date   WBC 7.7 12/23/2021   HGB 13.2 12/23/2021   HCT 41.0 12/23/2021   MCV 85 12/23/2021   PLT 372 12/23/2021   Lab Results  Component Value Date   IRON 58 05/08/2014   TIBC 506 (H) 05/08/2014   FERRITIN 36 05/08/2014   Lab Results  Component Value Date   VD25OH 41.1 12/23/2021   VD25OH 50.3 06/12/2021   VD25OH 50.9  11/14/2020    Attestation Statements:   Reviewed by clinician on day of visit: allergies, medications, problem list, medical history, surgical history, family history, social history, and previous encounter notes.

## 2021-12-24 ENCOUNTER — Other Ambulatory Visit (HOSPITAL_COMMUNITY): Payer: Self-pay

## 2021-12-24 ENCOUNTER — Encounter (INDEPENDENT_AMBULATORY_CARE_PROVIDER_SITE_OTHER): Payer: Self-pay | Admitting: Family Medicine

## 2021-12-24 ENCOUNTER — Telehealth (INDEPENDENT_AMBULATORY_CARE_PROVIDER_SITE_OTHER): Payer: 59 | Admitting: Family Medicine

## 2021-12-24 VITALS — BP 115/74 | HR 105 | Temp 98.8°F | Resp 16 | Ht 64.0 in | Wt 191.0 lb

## 2021-12-24 DIAGNOSIS — E785 Hyperlipidemia, unspecified: Secondary | ICD-10-CM | POA: Diagnosis not present

## 2021-12-24 DIAGNOSIS — E559 Vitamin D deficiency, unspecified: Secondary | ICD-10-CM | POA: Diagnosis not present

## 2021-12-24 DIAGNOSIS — Z6832 Body mass index (BMI) 32.0-32.9, adult: Secondary | ICD-10-CM | POA: Diagnosis not present

## 2021-12-24 DIAGNOSIS — E1169 Type 2 diabetes mellitus with other specified complication: Secondary | ICD-10-CM | POA: Diagnosis not present

## 2021-12-24 DIAGNOSIS — Z7985 Long-term (current) use of injectable non-insulin antidiabetic drugs: Secondary | ICD-10-CM | POA: Diagnosis not present

## 2021-12-24 DIAGNOSIS — E669 Obesity, unspecified: Secondary | ICD-10-CM | POA: Diagnosis not present

## 2021-12-24 LAB — LIPID PANEL WITH LDL/HDL RATIO
Cholesterol, Total: 160 mg/dL (ref 100–199)
HDL: 44 mg/dL
LDL Chol Calc (NIH): 90 mg/dL (ref 0–99)
LDL/HDL Ratio: 2 ratio (ref 0.0–3.2)
Triglycerides: 150 mg/dL — ABNORMAL HIGH (ref 0–149)
VLDL Cholesterol Cal: 26 mg/dL (ref 5–40)

## 2021-12-24 LAB — VITAMIN D 25 HYDROXY (VIT D DEFICIENCY, FRACTURES): Vit D, 25-Hydroxy: 41.1 ng/mL (ref 30.0–100.0)

## 2021-12-24 LAB — CBC WITH DIFFERENTIAL/PLATELET
Basophils Absolute: 0.1 x10E3/uL (ref 0.0–0.2)
Basos: 1 %
EOS (ABSOLUTE): 0.1 x10E3/uL (ref 0.0–0.4)
Eos: 1 %
Hematocrit: 41 % (ref 34.0–46.6)
Hemoglobin: 13.2 g/dL (ref 11.1–15.9)
Immature Grans (Abs): 0 x10E3/uL (ref 0.0–0.1)
Immature Granulocytes: 0 %
Lymphocytes Absolute: 2.1 x10E3/uL (ref 0.7–3.1)
Lymphs: 28 %
MCH: 27.3 pg (ref 26.6–33.0)
MCHC: 32.2 g/dL (ref 31.5–35.7)
MCV: 85 fL (ref 79–97)
Monocytes Absolute: 0.5 x10E3/uL (ref 0.1–0.9)
Monocytes: 7 %
Neutrophils Absolute: 4.9 x10E3/uL (ref 1.4–7.0)
Neutrophils: 63 %
Platelets: 372 x10E3/uL (ref 150–450)
RBC: 4.83 x10E6/uL (ref 3.77–5.28)
RDW: 13.4 % (ref 11.7–15.4)
WBC: 7.7 x10E3/uL (ref 3.4–10.8)

## 2021-12-24 LAB — COMPREHENSIVE METABOLIC PANEL WITH GFR
ALT: 18 IU/L (ref 0–32)
AST: 21 IU/L (ref 0–40)
Albumin/Globulin Ratio: 1.5 (ref 1.2–2.2)
Albumin: 4.4 g/dL (ref 3.9–4.9)
Alkaline Phosphatase: 88 IU/L (ref 44–121)
BUN/Creatinine Ratio: 14 (ref 9–23)
BUN: 11 mg/dL (ref 6–24)
Bilirubin Total: 0.3 mg/dL (ref 0.0–1.2)
CO2: 24 mmol/L (ref 20–29)
Calcium: 9.5 mg/dL (ref 8.7–10.2)
Chloride: 97 mmol/L (ref 96–106)
Creatinine, Ser: 0.77 mg/dL (ref 0.57–1.00)
Globulin, Total: 2.9 g/dL (ref 1.5–4.5)
Glucose: 90 mg/dL (ref 70–99)
Potassium: 3.5 mmol/L (ref 3.5–5.2)
Sodium: 140 mmol/L (ref 134–144)
Total Protein: 7.3 g/dL (ref 6.0–8.5)
eGFR: 95 mL/min/1.73

## 2021-12-24 LAB — HEMOGLOBIN A1C
Est. average glucose Bld gHb Est-mCnc: 128 mg/dL
Hgb A1c MFr Bld: 6.1 % — ABNORMAL HIGH (ref 4.8–5.6)

## 2021-12-24 LAB — MICROALBUMIN / CREATININE URINE RATIO
Creatinine, Urine: 219.2 mg/dL
Microalb/Creat Ratio: 4 mg/g creat (ref 0–29)
Microalbumin, Urine: 7.7 ug/mL

## 2021-12-24 MED ORDER — TIRZEPATIDE 15 MG/0.5ML ~~LOC~~ SOAJ
15.0000 mg | SUBCUTANEOUS | 0 refills | Status: DC
  Filled 2021-12-24: qty 2, 28d supply, fill #0

## 2021-12-24 MED ORDER — VITAMIN D 50 MCG (2000 UT) PO CAPS: 1.0000 | ORAL_CAPSULE | Freq: Every day | ORAL | 0 refills | Status: AC

## 2021-12-24 MED ORDER — PRAVASTATIN SODIUM 20 MG PO TABS
20.0000 mg | ORAL_TABLET | ORAL | 0 refills | Status: DC
  Filled 2021-12-24: qty 90, fill #0
  Filled 2022-01-29: qty 36, 84d supply, fill #0
  Filled 2022-04-29: qty 36, 84d supply, fill #1

## 2021-12-25 ENCOUNTER — Encounter: Payer: Self-pay | Admitting: Adult Health

## 2021-12-25 ENCOUNTER — Inpatient Hospital Stay: Payer: 59 | Attending: Hematology and Oncology | Admitting: Adult Health

## 2021-12-25 ENCOUNTER — Other Ambulatory Visit: Payer: Self-pay

## 2021-12-25 VITALS — BP 121/73 | HR 84 | Temp 99.1°F | Resp 14 | Ht 64.0 in | Wt 193.8 lb

## 2021-12-25 DIAGNOSIS — J309 Allergic rhinitis, unspecified: Secondary | ICD-10-CM | POA: Insufficient documentation

## 2021-12-25 DIAGNOSIS — C50412 Malignant neoplasm of upper-outer quadrant of left female breast: Secondary | ICD-10-CM | POA: Insufficient documentation

## 2021-12-25 DIAGNOSIS — Z79810 Long term (current) use of selective estrogen receptor modulators (SERMs): Secondary | ICD-10-CM | POA: Diagnosis not present

## 2021-12-25 DIAGNOSIS — Z170 Estrogen receptor positive status [ER+]: Secondary | ICD-10-CM | POA: Diagnosis not present

## 2021-12-25 DIAGNOSIS — L503 Dermatographic urticaria: Secondary | ICD-10-CM | POA: Insufficient documentation

## 2021-12-25 DIAGNOSIS — Z923 Personal history of irradiation: Secondary | ICD-10-CM | POA: Insufficient documentation

## 2021-12-25 NOTE — Progress Notes (Signed)
SURVIVORSHIP VISIT:   BRIEF ONCOLOGIC HISTORY:  Oncology History  Malignant neoplasm of upper-outer quadrant of left breast in female, estrogen receptor positive ([place])  06/27/2021 Initial Diagnosis   Screening mammogram detected left breast asymmetry and distortion.  No ultrasound correlate.  Axilla negative, stereotactic biopsy showed grade 1 invasive lobular cancer with LCIS, negative for lymphovascular invasion, ER 90%, PR 80%, Ki-67 4%, HER2 equivocal by IHC, FISH negative, ratio 1.21   07/10/2021 Cancer Staging   Staging form: Breast, AJCC 8th Edition - Clinical: Stage Unknown (cTX, cN0, cM0, G1, ER+, PR+, HER2-) - Signed by Nicholas Lose, MD on 07/10/2021 Stage prefix: Initial diagnosis Histologic grading system: 3 grade system    Genetic Testing   Negative genetic testing. No pathogenic variants identified on the [facility] BRCAPlus+ CancerNext-Expanded+RNA panel. VUS in KIF1B called [id number] identified. The report date is 07/25/2021.  The CancerNext-Expanded + RNAinsight gene panel offered by [employer] and includes sequencing and rearrangement analysis for the following 77 genes: IP, ALK, APC*, ATM*, AXIN2, BAP1, BARD1, BLM, BMPR1A, BRCA1*, BRCA2*, BRIP1*, CDC73, CDH1*,CDK4, CDKN1B, CDKN2A, CHEK2*, CTNNA1, DICER1, FANCC, FH, FLCN, GALNT12, KIF1B, LZTR1, MAX, MEN1, MET, MLH1*, MSH2*, MSH3, MSH6*, MUTYH*, NBN, NF1*, NF2, NTHL1, PALB2*, PHOX2B, PMS2*, POT1, PRKAR1A, PTCH1, PTEN*, RAD51C*, RAD51D*,RB1, RECQL, RET, SDHA, SDHAF2, SDHB, SDHC, SDHD, SMAD4, SMARCA4, SMARCB1, SMARCE1, STK11, SUFU, TMEM127, TP53*,TSC1, TSC2, VHL and XRCC2 (sequencing and deletion/duplication); EGFR, EGLN1, HOXB13, KIT, MITF, PDGFRA, POLD1 and POLE (sequencing only); EPCAM and GREM1 (deletion/duplication only).   08/05/2021 Cancer Staging   Staging form: Breast, AJCC 8th Edition - Pathologic: Stage IA (pT2, pN0(i+), cM0, G2, ER+, PR+, HER2-) - Signed by Nicholas Lose, MD on 08/05/2021 Multigene prognostic tests  performed: Oncotype DX Histologic grading system: 3 grade system   08/12/2021 Oncotype testing   Oncotype score 14: Distant recurrence at 9 years: 4%   09/11/2021 - 10/08/2021 Radiation Therapy   Site Technique Total Dose (Gy) Dose per Fx (Gy) Completed Fx Beam Energies  Breast, Left: Breast_L_Axilla 3D 40.05/40.05 2.67 15/15 10X  Breast, Left: Breast_L_Bst 3D 10/10 2 5/5 6X, 10X     10/07/2021 -  Anti-estrogen oral therapy   20 MG Tamoxifen x 10 years     INTERVAL HISTORY:  [name] to review her survivorship care plan detailing her treatment course for breast cancer, as well as monitoring long-term side effects of that treatment, education regarding health maintenance, screening, and overall wellness and health promotion.     Overall, [name] reports feeling quite well.  She is taking tamoxifen daily and is experiencing some vaginal dryness, but otherwise is tolerating it well.    REVIEW OF SYSTEMS:  Review of Systems  Constitutional:  Negative for appetite change, chills, fatigue, fever and unexpected weight change.  HENT:   Negative for hearing loss, lump/mass and trouble swallowing.   Eyes:  Negative for eye problems and icterus.  Respiratory:  Negative for chest tightness, cough and shortness of breath.   Cardiovascular:  Negative for chest pain, leg swelling and palpitations.  Gastrointestinal:  Negative for abdominal distention, abdominal pain, constipation, diarrhea, nausea and vomiting.  Endocrine: Negative for hot flashes.  Genitourinary:  Negative for difficulty urinating.   Musculoskeletal:  Negative for arthralgias.  Skin:  Negative for itching and rash.  Neurological:  Negative for dizziness, extremity weakness, headaches and numbness.  Hematological:  Negative for adenopathy. Does not bruise/bleed easily.  Psychiatric/Behavioral:  Negative for depression. The patient is not nervous/anxious.   Breast: Denies any new nodularity, masses, tenderness, nipple changes,  or nipple discharge.      ONCOLOGY TREATMENT TEAM:  1. Surgeon:  Dr. Donne Hazel at [facility] 2. Medical Oncologist: Dr. Lindi Adie  3. Radiation Oncologist: Dr. Isidore Moos    PAST MEDICAL/SURGICAL HISTORY:  Past Medical History:  Diagnosis Date   Allergy    seasonal, dogs, cats   Angioneurotic edema 05/08/2014   Cancer ([place]) 07/24/2021   Breast   Diabetes mellitus without complication ([place])    Leukocytosis 05/08/2014   Microcytic anemia 05/08/2014   Thrombocytosis 05/08/2014   Past Surgical History:  Procedure Laterality Date   APPENDECTOMY     BREAST BIOPSY Left 07/24/2021   MRI   BREAST BIOPSY Left 06/27/2021   U/S   BREAST LUMPECTOMY WITH RADIOACTIVE SEED AND SENTINEL LYMPH NODE BIOPSY Left 07/28/2021   Procedure: LEFT BREAST BRACKETED LUMPECTOMY WITH RADIOACTIVE SEED AND AXILLARY SENTINEL LYMPH NODE BIOPSY;  Surgeon: Rolm Bookbinder, MD;  Location: [facility];  Service: General;  Laterality: Left;   CESAREAN SECTION     CHOLECYSTECTOMY     WRIST SURGERY       ALLERGIES:  Allergies  Allergen Reactions   Gluten Meal    Dilaudid [Hydromorphone Hcl] Rash     CURRENT MEDICATIONS:  Outpatient Encounter Medications as of 12/25/2021  Medication Sig   azelastine (ASTELIN) 0.1 % nasal spray Place 1-2 sprays into both nostrils 2 (two) times daily.   calcium-vitamin D (OSCAL WITH D) 500-200 MG-UNIT per tablet Take 1 tablet by mouth daily.   Cholecalciferol (VITAMIN D) 50 MCG (2000 UT) CAPS Take 1 capsule (2,000 Units total) by mouth daily.   [employer] FIBER GUMMIES PO Take 2 Units by mouth daily.   fexofenadine (ALLEGRA) 180 MG tablet Take 1 tablet Orally Once a day in the morning. (please fill with levocetirizine)   fluticasone (FLONASE) 50 MCG/ACT nasal spray Place 2 sprays into both nostrils daily.   hydrochlorothiazide (HYDRODIURIL) 25 MG tablet Take 1 tablet (25 mg total) by mouth daily for edema   levocetirizine (XYZAL) 5 MG tablet Take 1 tablet  (5 mg total) by mouth every evening.   levonorgestrel (MIRENA, 52 MG,) 20 MCG/DAY IUD 1 Intra Uterine Device by Intrauterine route once.   Multiple Vitamin (MULTIVITAMIN) tablet Take 1 tablet by mouth daily.   [START ON 12/26/2021] pravastatin (PRAVACHOL) 20 MG tablet Take 1 tablet (20 mg total) by mouth 3 times a week on Monday, Wednesday, and Friday.   Probiotic Product (DIGESTIVE ADV DIGESTIVE/IMMUNE PO) Take 1 Units by mouth daily.   tamoxifen (NOLVADEX) 20 MG tablet Take 1 tablet (20 mg total) by mouth daily.   tirzepatide (MOUNJARO) 15 MG/0.5ML Pen Inject 15 mg into the skin once a week.   [DISCONTINUED] pravastatin (PRAVACHOL) 20 MG tablet Take 1 tablet (20 mg total) by mouth 2 (two) times a week on Monday and Thursday   [DISCONTINUED] tirzepatide [facility]) 15 MG/0.5ML Pen Inject 15 mg into the skin once a week.   No facility-administered encounter medications on file as of 12/25/2021.     ONCOLOGIC FAMILY HISTORY:  Family History  Problem Relation Age of Onset   Diabetes Mother    High blood pressure Mother    High Cholesterol Mother    Stroke Mother    Obesity Mother    High blood pressure Father    High Cholesterol Father    Alcoholism Father    Obesity Father    Colon cancer Maternal Aunt    Other Maternal Aunt  brain tumor   Bladder Cancer Maternal Uncle        dx 69s   Kidney cancer Paternal Grandmother        dx 77s   Lung cancer Paternal Grandfather    Hypertension Other    Hyperlipidemia Other    Cancer Other    Stroke Other    Diabetes Other    Pancreatic cancer Other      GENETIC COUNSELING/TESTING: neg  SOCIAL HISTORY:  Social History   Socioeconomic History   Marital status: Married    Spouse name: Denyse Amass   Number of children: Not on file   Years of education: Not on file   Highest education level: Not on file  Occupational History   Occupation: RN  Tobacco Use   Smoking status: Never   Smokeless tobacco: Never  Vaping Use   Vaping  Use: Never used  Substance and Sexual Activity   Alcohol use: No    Alcohol/week: 0.0 standard drinks of alcohol   Drug use: No   Sexual activity: Yes    Birth control/protection: I.U.D.  Other Topics Concern   Not on file  Social History Narrative   Not on file   Social Determinants of Health   Financial Resource Strain: Low Risk  (07/15/2021)   Overall Financial Resource Strain (CARDIA)    Difficulty of Paying Living Expenses: Not hard at all  Food Insecurity: No Food Insecurity (07/15/2021)   Hunger Vital Sign    Worried About Running Out of Food in the Last Year: Never true    Ran Out of Food in the Last Year: Never true  Transportation Needs: No Transportation Needs (07/15/2021)   PRAPARE - Hydrologist (Medical): No    Lack of Transportation (Non-Medical): No  Physical Activity: Not on file  Stress: Not on file  Social Connections: Not on file  Intimate Partner Violence: Not on file     OBSERVATIONS/OBJECTIVE:  BP 121/73 (BP Location: Right Arm, Patient Position: Sitting)   Pulse 84   Temp 99.1 F (37.3 C) (Temporal)   Resp 14   Ht 5' 4"  (1.626 m)   Wt 193 lb 12.8 oz (87.9 kg)   SpO2 97%   BMI 33.27 kg/m  GENERAL: Patient is a well appearing female in no acute distress HEENT:  Sclerae anicteric.  Oropharynx clear and moist. No ulcerations or evidence of oropharyngeal candidiasis. Neck is supple.  NODES:  No cervical, supraclavicular, or axillary lymphadenopathy palpated.  BREAST EXAM: Right breast is benign left breast status postlumpectomy and radiation no sign of local recurrence LUNGS:  Clear to auscultation bilaterally.  No wheezes or rhonchi. HEART:  Regular rate and rhythm. No murmur appreciated. ABDOMEN:  Soft, nontender.  Positive, normoactive bowel sounds. No organomegaly palpated. MSK:  No focal spinal tenderness to palpation. Full range of motion bilaterally in the upper extremities. EXTREMITIES:  No peripheral edema.    SKIN:  Clear with no obvious rashes or skin changes. No nail dyscrasia. NEURO:  Nonfocal. Well oriented.  Appropriate affect.   LABORATORY DATA:  None for this visit.  DIAGNOSTIC IMAGING:  None for this visit.      ASSESSMENT AND PLAN:  Ms.. Elizabeth Cummings is a pleasant 46 y.o. female with Stage IA left breast invasive ductal carcinoma, ER+/PR+/HER2-, diagnosed in 06/2021, treated with lumpectomy, adjuvant radiation therapy, and anti-estrogen therapy with Tamoxifen beginning in 09/2021.  She presents to the Survivorship Clinic for our initial meeting and routine follow-up post-completion of  treatment for breast cancer.    1. Stage IA right/left breast cancer:  [name] is continuing to recover from definitive treatment for breast cancer. She will follow-up with her medical oncologist, Dr. Lindi Adie in 6 months with history and physical exam per surveillance protocol.  She will continue her anti-estrogen therapy with tamoxifen. Thus far, she is tolerating the tamoxifen well, with minimal side effects. She was instructed to make Dr. Lindi Adie or myself aware if she begins to experience any worsening side effects of the medication and I could see her back in clinic to help manage those side effects, as needed. Her mammogram is due March 2024; orders placed today.  Since one of her breast cancers in the left breast was mammographically occult we will likely order breast MRI in September 2024.  She will see Dr. Lindi Adie in April and reviewed this with him and he will order it at that time.    Today, a comprehensive survivorship care plan and treatment summary was reviewed with the patient today detailing her breast cancer diagnosis, treatment course, potential late/long-term effects of treatment, appropriate follow-up care with recommendations for the future, and patient education resources.  A copy of this summary, along with a letter will be sent to the patient's primary care provider via mail/fax/In Basket message  after today's visit.    2. Bone health:  She was given education on specific activities to promote bone health.  3. Cancer screening:  Due to [name]'s history and her age, she should receive screening for skin cancers, colon cancer, and gynecologic cancers.  The information and recommendations are listed on the patient's comprehensive care plan/treatment summary and were reviewed in detail with the patient.    4. Health maintenance and wellness promotion: [name] was encouraged to consume 5-7 servings of fruits and vegetables per day. We reviewed the "Nutrition Rainbow" handout.  She was also encouraged to engage in moderate to vigorous exercise for 30 minutes per day most days of the week. We discussed the [employer] fitness program, which is designed for cancer survivors to help them become more physically fit after cancer treatments.  She was instructed to limit her alcohol consumption and continue to abstain from tobacco use.     5. Support services/counseling: It is not uncommon for this period of the patient's cancer care trajectory to be one of many emotions and stressors.  She was given information regarding our available services and encouraged to contact me with any questions or for help enrolling in any of our support group/programs.    Follow up instructions:    -Return to cancer center 6 months for f/u with Dr. Lindi Adie  -Mammogram due in 05/2022 -She is welcome to return back to the Survivorship Clinic at any time; no additional follow-up needed at this time.  -Consider referral back to survivorship as a long-term survivor for continued surveillance  The patient was provided an opportunity to ask questions and all were answered. The patient agreed with the plan and demonstrated an understanding of the instructions.   Total encounter time:45 minutes*in face-to-face visit time, chart review, lab review, care coordination, order entry, and documentation of the encounter  time.    Wilber Bihari, NP 12/25/21 2:49 PM Medical Oncology and Hematology [facility] [place], [place] [address] Tel. [phone]    Fax. [phone]  *Total Encounter Time as defined by the Centers for Medicare and Medicaid Services includes, in addition to the face-to-face time of a  patient visit (documented in the note above) non-face-to-face time: obtaining and reviewing outside history, ordering and reviewing medications, tests or procedures, care coordination (communications with other health care professionals or caregivers) and documentation in the medical record.

## 2021-12-25 NOTE — Progress Notes (Signed)
[employer] requisition and all supporting documents faxed to [phone] with fax confirmation.

## 2021-12-31 DIAGNOSIS — C50412 Malignant neoplasm of upper-outer quadrant of left female breast: Secondary | ICD-10-CM | POA: Diagnosis not present

## 2021-12-31 DIAGNOSIS — Z170 Estrogen receptor positive status [ER+]: Secondary | ICD-10-CM | POA: Diagnosis not present

## 2022-01-07 DIAGNOSIS — M62830 Muscle spasm of back: Secondary | ICD-10-CM | POA: Diagnosis not present

## 2022-01-07 DIAGNOSIS — M9903 Segmental and somatic dysfunction of lumbar region: Secondary | ICD-10-CM | POA: Diagnosis not present

## 2022-01-08 DIAGNOSIS — M62830 Muscle spasm of back: Secondary | ICD-10-CM | POA: Diagnosis not present

## 2022-01-08 DIAGNOSIS — M9903 Segmental and somatic dysfunction of lumbar region: Secondary | ICD-10-CM | POA: Diagnosis not present

## 2022-01-12 DIAGNOSIS — M62830 Muscle spasm of back: Secondary | ICD-10-CM | POA: Diagnosis not present

## 2022-01-12 DIAGNOSIS — M9903 Segmental and somatic dysfunction of lumbar region: Secondary | ICD-10-CM | POA: Diagnosis not present

## 2022-01-14 DIAGNOSIS — M62830 Muscle spasm of back: Secondary | ICD-10-CM | POA: Diagnosis not present

## 2022-01-14 DIAGNOSIS — M9903 Segmental and somatic dysfunction of lumbar region: Secondary | ICD-10-CM | POA: Diagnosis not present

## 2022-01-15 ENCOUNTER — Other Ambulatory Visit (HOSPITAL_COMMUNITY): Payer: Self-pay

## 2022-01-15 ENCOUNTER — Encounter: Payer: 59 | Admitting: Adult Health

## 2022-01-15 DIAGNOSIS — J301 Allergic rhinitis due to pollen: Secondary | ICD-10-CM | POA: Diagnosis not present

## 2022-01-15 DIAGNOSIS — J3081 Allergic rhinitis due to animal (cat) (dog) hair and dander: Secondary | ICD-10-CM | POA: Diagnosis not present

## 2022-01-15 DIAGNOSIS — J3089 Other allergic rhinitis: Secondary | ICD-10-CM | POA: Diagnosis not present

## 2022-01-15 DIAGNOSIS — L503 Dermatographic urticaria: Secondary | ICD-10-CM | POA: Diagnosis not present

## 2022-01-15 MED ORDER — AZELASTINE HCL 0.1 % NA SOLN
1.0000 | Freq: Two times a day (BID) | NASAL | 5 refills | Status: AC
  Filled 2022-01-15: qty 30, 50d supply, fill #0
  Filled 2022-03-19: qty 30, 50d supply, fill #1
  Filled 2022-10-29: qty 30, 25d supply, fill #2
  Filled 2022-12-22: qty 30, 25d supply, fill #3

## 2022-01-16 ENCOUNTER — Other Ambulatory Visit (HOSPITAL_COMMUNITY): Payer: Self-pay

## 2022-01-21 DIAGNOSIS — M62830 Muscle spasm of back: Secondary | ICD-10-CM | POA: Diagnosis not present

## 2022-01-21 DIAGNOSIS — M9903 Segmental and somatic dysfunction of lumbar region: Secondary | ICD-10-CM | POA: Diagnosis not present

## 2022-01-22 LAB — SIGNATERA ONLY (NATERA MANAGED)
SIGNATERA MTM READOUT: 0 MTM/ml
SIGNATERA TEST RESULT: NEGATIVE

## 2022-01-26 DIAGNOSIS — M9903 Segmental and somatic dysfunction of lumbar region: Secondary | ICD-10-CM | POA: Diagnosis not present

## 2022-01-26 DIAGNOSIS — M62830 Muscle spasm of back: Secondary | ICD-10-CM | POA: Diagnosis not present

## 2022-01-26 NOTE — Progress Notes (Signed)
TeleHealth Visit:  This visit was completed with telemedicine (audio/video) technology. Saiyuri has verbally consented to this TeleHealth visit. The patient is located at home, the provider is located at home. The participants in this visit include the listed provider and patient. The visit was conducted today via MyChart video.  OBESITY Elizabeth Cummings is here to discuss her progress with her obesity treatment plan along with follow-up of her obesity related diagnoses.    Today's visit was # 16  Starting weight: 231 lbs Starting date: 11/14/20 Weight reported at last virtual office visit: 191.6 lbs on 12/24/21 Today's reported weight: 189.2 lbs Total weight loss: 42 Weight change since last visit: -2  Today's vital per pt: BP 111/73 HR 93 R 16 Temp 96.7 O2sat 97%  Nutrition Plan: practicing portion control and making smarter food choices, such as increasing vegetables and decreasing simple carbohydrates daily  Current exercise: taking steps rather than elevator and walking 14 minutes/day. 10000-12000 steps/day on workdays.  Interim History: Zaiyah has not been journaling since her last office visit.  However she is down 2 pounds.  She feels she has been making better choices and is doing better with protein intake. Goal weight 175 (30 BMI) Assessment/Plan:  1. Type II Diabetes HgbA1c is at goal. Last A1c was 6.1 on 12/23/21. CBGs: Fasting 90-120s, 2 hour PP up tp 170 occasionally.  Episodes of hypoglycemia: no Medication(s): Mounjaro 15 mg weekly.  Appetite well controlled.  Lab Results  Component Value Date   HGBA1C 6.1 (H) 12/23/2021   HGBA1C 6.3 (H) 06/12/2021   HGBA1C 6.6 03/14/2021   Lab Results  Component Value Date   LDLCALC 90 12/23/2021   CREATININE 0.77 12/23/2021    Plan: Refill Mounjaro 15 mg weekly. Refill lancets and strips.  2.  Hyperlipidemia associated with type 2 diabetes LDL is not at goal of 70.  Last LDL was 90 on  12/23/2021. Medication(s): Pravastatin 20 mg 3 times per week.  Increased at last office visit from twice weekly.  She denies myalgias or other side effects with increased dose. Cardiovascular risk factors: diabetes mellitus, dyslipidemia, obesity (BMI >= 30 kg/m2), and sedentary lifestyle  Lab Results  Component Value Date   CHOL 160 12/23/2021   HDL 44 12/23/2021   LDLCALC 90 12/23/2021   TRIG 150 (H) 12/23/2021   Lab Results  Component Value Date   ALT 18 12/23/2021   AST 21 12/23/2021   ALKPHOS 88 12/23/2021   BILITOT 0.3 12/23/2021   The 10-year ASCVD risk score (Arnett DK, et al., 2019) is: 1.8%   Values used to calculate the score:     Age: 46 years     Sex: Female     Is Non-Hispanic African American: No     Diabetic: Yes     Tobacco smoker: No     Systolic Blood Pressure: 121 mmHg     Is BP treated: No     HDL Cholesterol: 44 mg/dL     Total Cholesterol: 160 mg/dL  Plan: Continue pravastatin 20 mg 3 times per week.   3. Obesity: Current BMI 32.4 [place] is currently in the action stage of change. As such, her goal is to continue with weight loss efforts.  She has agreed to practicing portion control and making smarter food choices, such as increasing vegetables and decreasing simple carbohydrates.   Exercise goals: Consider increasing intentional exercise.  Behavioral modification strategies: increasing lean protein intake, decreasing simple carbohydrates, and planning for success.  [place] has agreed to follow-up  with our clinic in 4 weeks.   No orders of the defined types were placed in this encounter.   Medications Discontinued During This Encounter  Medication Reason   tirzepatide [facility]) 15 MG/0.5ML Pen Reorder     Meds ordered this encounter  Medications   tirzepatide (MOUNJARO) 15 MG/0.5ML Pen    Sig: Inject 15 mg into the skin once a week.    Dispense:  6 mL    Refill:  0    Order Specific Question:   Supervising Provider    Answer:    Seymour Bars E [[id number]]   Lancets (FREESTYLE) lancets    Sig: Use as instructed, test 2 times daily  as directed    Dispense:  300 each    Refill:  1    per secure chat 2 x a day    Order Specific Question:   Supervising Provider    Answer:   Glennis Brink [[id number]]   glucose blood (FREESTYLE TEST STRIPS) test strip    Sig: Use as instructed test 2 times daily as directed    Dispense:  300 each    Refill:  1    2 times a day per secure chat 01/27/22-also lite strips ok    Order Specific Question:   Supervising Provider    Answer:   Glennis Brink [[id number]]      Objective:   VITALS: Per patient if applicable, see vitals. GENERAL: Alert and in no acute distress. CARDIOPULMONARY: No increased WOB. Speaking in clear sentences.  PSYCH: Pleasant and cooperative. Speech normal rate and rhythm. Affect is appropriate. Insight and judgement are appropriate. Attention is focused, linear, and appropriate.  NEURO: Oriented as arrived to appointment on time with no prompting.   Lab Results  Component Value Date   CREATININE 0.77 12/23/2021   BUN 11 12/23/2021   NA 140 12/23/2021   K 3.5 12/23/2021   CL 97 12/23/2021   CO2 24 12/23/2021   Lab Results  Component Value Date   ALT 18 12/23/2021   AST 21 12/23/2021   ALKPHOS 88 12/23/2021   BILITOT 0.3 12/23/2021   Lab Results  Component Value Date   HGBA1C 6.1 (H) 12/23/2021   HGBA1C 6.3 (H) 06/12/2021   HGBA1C 6.6 03/14/2021   HGBA1C 6.4 (H) 11/14/2020   HGBA1C 6.4 12/27/2014   Lab Results  Component Value Date   INSULIN 20.3 11/14/2020   Lab Results  Component Value Date   TSH 0.785 11/14/2020   Lab Results  Component Value Date   CHOL 160 12/23/2021   HDL 44 12/23/2021   LDLCALC 90 12/23/2021   TRIG 150 (H) 12/23/2021   Lab Results  Component Value Date   WBC 7.7 12/23/2021   HGB 13.2 12/23/2021   HCT 41.0 12/23/2021   MCV 85 12/23/2021   PLT 372 12/23/2021   Lab Results  Component Value Date   IRON 58 05/08/2014    TIBC 506 (H) 05/08/2014   FERRITIN 36 05/08/2014   Lab Results  Component Value Date   VD25OH 41.1 12/23/2021   VD25OH 50.3 06/12/2021   VD25OH 50.9 11/14/2020    Attestation Statements:   Reviewed by clinician on day of visit: allergies, medications, problem list, medical history, surgical history, family history, social history, and previous encounter notes.

## 2022-01-27 ENCOUNTER — Other Ambulatory Visit (HOSPITAL_COMMUNITY): Payer: Self-pay

## 2022-01-27 ENCOUNTER — Telehealth (INDEPENDENT_AMBULATORY_CARE_PROVIDER_SITE_OTHER): Payer: 59 | Admitting: Family Medicine

## 2022-01-27 ENCOUNTER — Encounter (INDEPENDENT_AMBULATORY_CARE_PROVIDER_SITE_OTHER): Payer: Self-pay | Admitting: Family Medicine

## 2022-01-27 ENCOUNTER — Encounter (HOSPITAL_COMMUNITY): Payer: Self-pay

## 2022-01-27 VITALS — BP 111/73 | HR 93 | Temp 96.7°F | Ht 64.0 in | Wt 189.0 lb

## 2022-01-27 DIAGNOSIS — Z6832 Body mass index (BMI) 32.0-32.9, adult: Secondary | ICD-10-CM | POA: Diagnosis not present

## 2022-01-27 DIAGNOSIS — E785 Hyperlipidemia, unspecified: Secondary | ICD-10-CM

## 2022-01-27 DIAGNOSIS — E669 Obesity, unspecified: Secondary | ICD-10-CM | POA: Diagnosis not present

## 2022-01-27 DIAGNOSIS — E1169 Type 2 diabetes mellitus with other specified complication: Secondary | ICD-10-CM

## 2022-01-27 DIAGNOSIS — Z7985 Long-term (current) use of injectable non-insulin antidiabetic drugs: Secondary | ICD-10-CM

## 2022-01-27 MED ORDER — FREESTYLE LANCETS MISC
1 refills | Status: DC
  Filled 2022-01-27: qty 100, 50d supply, fill #0

## 2022-01-27 MED ORDER — FREESTYLE TEST VI STRP
ORAL_STRIP | 1 refills | Status: DC
  Filled 2022-01-27: qty 150, 75d supply, fill #0

## 2022-01-27 MED ORDER — TIRZEPATIDE 15 MG/0.5ML ~~LOC~~ SOAJ
15.0000 mg | SUBCUTANEOUS | 0 refills | Status: DC
  Filled 2022-01-27: qty 2, 28d supply, fill #0
  Filled 2022-02-20: qty 2, 28d supply, fill #1

## 2022-01-28 ENCOUNTER — Telehealth: Payer: Self-pay

## 2022-01-28 NOTE — Telephone Encounter (Signed)
Called pt per MD to advise [employer] testing was negative/not detected. Pt verbalized understanding of results and knows Daleen Snook will be in touch to schedule 3 mo repeat lab.

## 2022-01-29 ENCOUNTER — Other Ambulatory Visit (HOSPITAL_COMMUNITY): Payer: Self-pay

## 2022-01-29 MED ORDER — CIPROFLOXACIN HCL 500 MG PO TABS
500.0000 mg | ORAL_TABLET | Freq: Two times a day (BID) | ORAL | 0 refills | Status: DC
  Filled 2022-01-29: qty 30, 15d supply, fill #0

## 2022-02-09 DIAGNOSIS — M9903 Segmental and somatic dysfunction of lumbar region: Secondary | ICD-10-CM | POA: Diagnosis not present

## 2022-02-09 DIAGNOSIS — M62830 Muscle spasm of back: Secondary | ICD-10-CM | POA: Diagnosis not present

## 2022-02-16 DIAGNOSIS — M62830 Muscle spasm of back: Secondary | ICD-10-CM | POA: Diagnosis not present

## 2022-02-16 DIAGNOSIS — M9903 Segmental and somatic dysfunction of lumbar region: Secondary | ICD-10-CM | POA: Diagnosis not present

## 2022-02-20 ENCOUNTER — Other Ambulatory Visit (HOSPITAL_COMMUNITY): Payer: Self-pay

## 2022-02-23 DIAGNOSIS — M9903 Segmental and somatic dysfunction of lumbar region: Secondary | ICD-10-CM | POA: Diagnosis not present

## 2022-02-23 DIAGNOSIS — M62830 Muscle spasm of back: Secondary | ICD-10-CM | POA: Diagnosis not present

## 2022-02-24 NOTE — Progress Notes (Deleted)
TeleHealth Visit:  This visit was completed with telemedicine (audio/video) technology. Jerrilynn has verbally consented to this TeleHealth visit. The patient is located at home, the provider is located at home. The participants in this visit include the listed provider and patient. The visit was conducted today via MyChart video.  OBESITY Fanta is here to discuss her progress with her obesity treatment plan along with follow-up of her obesity related diagnoses.   Today's visit was # 17 Starting weight: 231 lbs Starting date: 11/14/20 Weight reported at last virtual office visit: 189.2 on 01/27/22 Today's reported weight: *** lbs Total weight loss: *** lbs   Today's vital per pt: BP - HR - R - Temp - O2sat -   Nutrition Plan: practicing portion control and making smarter food choices, such as increasing vegetables and decreasing simple carbohydrates daily   Current exercise: taking steps rather than elevator and walking 14 minutes/day. 10000-12000 steps/day on workdays.  Interim History: ***  Assessment/Plan:  1. ***  2. ***  3. ***  Obesity: Current BMI *** Mariadelaluz {CHL AMB IS/IS NOT:210130109} currently in the action stage of change. As such, her goal is to {MWMwtloss#1:210800005}.  She has agreed to {MWMwtlossportion/plan2:23431}.   Exercise goals: {MWM EXERCISE RECS:23473}  Behavioral modification strategies: {MWMwtlossdietstrategies3:23432}.  Milissa has agreed to follow-up with our clinic in {NUMBER 1-10:22536} weeks.   No orders of the defined types were placed in this encounter.   There are no discontinued medications.   No orders of the defined types were placed in this encounter.     Objective:   VITALS: Per patient if applicable, see vitals. GENERAL: Alert and in no acute distress. CARDIOPULMONARY: No increased WOB. Speaking in clear sentences.  PSYCH: Pleasant and cooperative. Speech normal rate and rhythm. Affect is appropriate.  Insight and judgement are appropriate. Attention is focused, linear, and appropriate.  NEURO: Oriented as arrived to appointment on time with no prompting.   Lab Results  Component Value Date   CREATININE 0.77 12/23/2021   BUN 11 12/23/2021   NA 140 12/23/2021   K 3.5 12/23/2021   CL 97 12/23/2021   CO2 24 12/23/2021   Lab Results  Component Value Date   ALT 18 12/23/2021   AST 21 12/23/2021   ALKPHOS 88 12/23/2021   BILITOT 0.3 12/23/2021   Lab Results  Component Value Date   HGBA1C 6.1 (H) 12/23/2021   HGBA1C 6.3 (H) 06/12/2021   HGBA1C 6.6 03/14/2021   HGBA1C 6.4 (H) 11/14/2020   HGBA1C 6.4 12/27/2014   Lab Results  Component Value Date   INSULIN 20.3 11/14/2020   Lab Results  Component Value Date   TSH 0.785 11/14/2020   Lab Results  Component Value Date   CHOL 160 12/23/2021   HDL 44 12/23/2021   LDLCALC 90 12/23/2021   TRIG 150 (H) 12/23/2021   Lab Results  Component Value Date   WBC 7.7 12/23/2021   HGB 13.2 12/23/2021   HCT 41.0 12/23/2021   MCV 85 12/23/2021   PLT 372 12/23/2021   Lab Results  Component Value Date   IRON 58 05/08/2014   TIBC 506 (H) 05/08/2014   FERRITIN 36 05/08/2014   Lab Results  Component Value Date   VD25OH 41.1 12/23/2021   VD25OH 50.3 06/12/2021   VD25OH 50.9 11/14/2020    Attestation Statements:   Reviewed by clinician on day of visit: allergies, medications, problem list, medical history, surgical history, family history, social history, and previous encounter notes.  ***(delete if time-based  billing not used) Time spent on visit including the items listed below was *** minutes.  -preparing to see the patient (e.g., review of tests, history, previous notes) -obtaining and/or reviewing separately obtained history -counseling and educating the patient/family/caregiver -documenting clinical information in the electronic or other health record -ordering medications, tests, or procedures -independently  interpreting results and communicating results to the patient/ family/caregiver -referring and communicating with other health care professionals  -care coordination

## 2022-02-25 ENCOUNTER — Telehealth (INDEPENDENT_AMBULATORY_CARE_PROVIDER_SITE_OTHER): Payer: 59 | Admitting: Family Medicine

## 2022-02-25 NOTE — Progress Notes (Signed)
TeleHealth Visit:  This visit was completed with telemedicine (audio/video) technology. Elizabeth Cummings has verbally consented to this TeleHealth visit. The patient is located at home, the provider is located at home. The participants in this visit include the listed provider and patient. The visit was conducted today via MyChart video.  OBESITY Elizabeth Cummings is here to discuss her progress with her obesity treatment plan along with follow-up of her obesity related diagnoses.   Today's visit was # 17 Starting weight: 231 lbs Starting date: 11/14/20 Weight reported at last virtual office visit: 189.2 on 01/27/22 Today's reported weight: 187.0 lbs Total weight loss: 44 lbs   Today's vital per pt: T 97.5 Bp 122/81 Hr 88 R 18 O2sat 97%   Nutrition Plan: practicing portion control and making smarter food choices, such as increasing vegetables and decreasing simple carbohydrates daily   Current exercise: taking steps rather than elevator and walking 14 minutes/day. 10000-12000 steps/day on workdays. ___________________________________________________________________  Interim History:  Elizabeth Cummings reports that she does not always get in enough protein at her meals.  She tries to have protein snacks such as yogurt.  She feels she needs to increase her water intake. She is getting quite a bit of activity into her workday with taking steps versus the elevator. She starts work at 3 AM.  Works about 45 hours/week at [employer].  Weight goal is 174 pounds-29 BMI.  Assessment/Plan:  1. B12 deficiency Most recent B12 was 428 on 11/14/2020.  Plan: Begin OTC B12 1000 mcg daily. She is seeing PCP soon and will have him check this.   2. Type II Diabetes HgbA1c is at goal. Last A1c was 6.1 on 12/23/2021 Medication(s): Mounjaro 15 mg weekly.  Tolerating well.  Lab Results  Component Value Date   HGBA1C 6.1 (H) 12/23/2021   HGBA1C 6.3 (H) 06/12/2021   HGBA1C 6.6 03/14/2021   Lab  Results  Component Value Date   LDLCALC 90 12/23/2021   CREATININE 0.77 12/23/2021    Plan: Refill Mounjaro 15 mg weekly.   3. Obesity: Current BMI 32.1 Elizabeth Cummings is currently in the action stage of change. As such, her goal is to continue with weight loss efforts.  She has agreed to practicing portion control and making smarter food choices, such as increasing vegetables and decreasing simple carbohydrates.   Exercise goals: as is  Behavioral modification strategies: increasing lean protein intake, decreasing simple carbohydrates, meal planning and cooking strategies, and better snacking choices.  Elizabeth Cummings has agreed to follow-up with our clinic in 4 weeks.   No orders of the defined types were placed in this encounter.   Medications Discontinued During This Encounter  Medication Reason   tirzepatide [facility]) 15 MG/0.5ML Pen Reorder     Meds ordered this encounter  Medications   tirzepatide (MOUNJARO) 15 MG/0.5ML Pen    Sig: Inject 15 mg into the skin once a week.    Dispense:  6 mL    Refill:  0    Order Specific Question:   Supervising Provider    Answer:   Dell Ponto [[id number]]   cyanocobalamin (VITAMIN B12) 1000 MCG tablet    Sig: Take 1 tablet (1,000 mcg total) by mouth daily.    Dispense:  30 tablet    Refill:  0    Order Specific Question:   Supervising Provider    Answer:   Dell Ponto [[id number]]      Objective:   VITALS: Per patient if applicable, see vitals. GENERAL: Alert and in  no acute distress. CARDIOPULMONARY: No increased WOB. Speaking in clear sentences.  PSYCH: Pleasant and cooperative. Speech normal rate and rhythm. Affect is appropriate. Insight and judgement are appropriate. Attention is focused, linear, and appropriate.  NEURO: Oriented as arrived to appointment on time with no prompting.   Lab Results  Component Value Date   CREATININE 0.77 12/23/2021   BUN 11 12/23/2021   NA 140 12/23/2021   K 3.5 12/23/2021   CL 97 12/23/2021   CO2  24 12/23/2021   Lab Results  Component Value Date   ALT 18 12/23/2021   AST 21 12/23/2021   ALKPHOS 88 12/23/2021   BILITOT 0.3 12/23/2021   Lab Results  Component Value Date   HGBA1C 6.1 (H) 12/23/2021   HGBA1C 6.3 (H) 06/12/2021   HGBA1C 6.6 03/14/2021   HGBA1C 6.4 (H) 11/14/2020   HGBA1C 6.4 12/27/2014   Lab Results  Component Value Date   INSULIN 20.3 11/14/2020   Lab Results  Component Value Date   TSH 0.785 11/14/2020   Lab Results  Component Value Date   CHOL 160 12/23/2021   HDL 44 12/23/2021   LDLCALC 90 12/23/2021   TRIG 150 (H) 12/23/2021   Lab Results  Component Value Date   WBC 7.7 12/23/2021   HGB 13.2 12/23/2021   HCT 41.0 12/23/2021   MCV 85 12/23/2021   PLT 372 12/23/2021   Lab Results  Component Value Date   IRON 58 05/08/2014   TIBC 506 (H) 05/08/2014   FERRITIN 36 05/08/2014   Lab Results  Component Value Date   VD25OH 41.1 12/23/2021   VD25OH 50.3 06/12/2021   VD25OH 50.9 11/14/2020    Attestation Statements:   Reviewed by clinician on day of visit: allergies, medications, problem list, medical history, surgical history, family history, social history, and previous encounter notes.

## 2022-03-02 ENCOUNTER — Telehealth (INDEPENDENT_AMBULATORY_CARE_PROVIDER_SITE_OTHER): Payer: 59 | Admitting: Family Medicine

## 2022-03-02 ENCOUNTER — Other Ambulatory Visit (HOSPITAL_COMMUNITY): Payer: Self-pay

## 2022-03-02 ENCOUNTER — Ambulatory Visit: Payer: Self-pay

## 2022-03-02 ENCOUNTER — Encounter (INDEPENDENT_AMBULATORY_CARE_PROVIDER_SITE_OTHER): Payer: Self-pay | Admitting: Family Medicine

## 2022-03-02 VITALS — Ht 64.0 in | Wt 187.0 lb

## 2022-03-02 DIAGNOSIS — E1169 Type 2 diabetes mellitus with other specified complication: Secondary | ICD-10-CM | POA: Diagnosis not present

## 2022-03-02 DIAGNOSIS — E538 Deficiency of other specified B group vitamins: Secondary | ICD-10-CM | POA: Diagnosis not present

## 2022-03-02 DIAGNOSIS — Z7985 Long-term (current) use of injectable non-insulin antidiabetic drugs: Secondary | ICD-10-CM | POA: Diagnosis not present

## 2022-03-02 DIAGNOSIS — E669 Obesity, unspecified: Secondary | ICD-10-CM | POA: Diagnosis not present

## 2022-03-02 DIAGNOSIS — Z6832 Body mass index (BMI) 32.0-32.9, adult: Secondary | ICD-10-CM | POA: Diagnosis not present

## 2022-03-02 MED ORDER — VITAMIN B-12 1000 MCG PO TABS: 1000.0000 ug | ORAL_TABLET | Freq: Every day | ORAL | 0 refills | Status: DC

## 2022-03-02 MED ORDER — TIRZEPATIDE 15 MG/0.5ML ~~LOC~~ SOAJ
15.0000 mg | SUBCUTANEOUS | 0 refills | Status: DC
  Filled 2022-03-02 – 2022-03-19 (×2): qty 6, 84d supply, fill #0

## 2022-03-04 ENCOUNTER — Ambulatory Visit: Payer: 59 | Attending: General Surgery | Admitting: Physical Therapy

## 2022-03-04 DIAGNOSIS — M62830 Muscle spasm of back: Secondary | ICD-10-CM | POA: Diagnosis not present

## 2022-03-04 DIAGNOSIS — M9903 Segmental and somatic dysfunction of lumbar region: Secondary | ICD-10-CM | POA: Diagnosis not present

## 2022-03-04 DIAGNOSIS — Z483 Aftercare following surgery for neoplasm: Secondary | ICD-10-CM | POA: Insufficient documentation

## 2022-03-04 DIAGNOSIS — I890 Lymphedema, not elsewhere classified: Secondary | ICD-10-CM | POA: Insufficient documentation

## 2022-03-04 DIAGNOSIS — Z170 Estrogen receptor positive status [ER+]: Secondary | ICD-10-CM | POA: Insufficient documentation

## 2022-03-04 DIAGNOSIS — C50412 Malignant neoplasm of upper-outer quadrant of left female breast: Secondary | ICD-10-CM | POA: Insufficient documentation

## 2022-03-04 DIAGNOSIS — R293 Abnormal posture: Secondary | ICD-10-CM | POA: Insufficient documentation

## 2022-03-04 NOTE — Therapy (Signed)
OUTPATIENT PHYSICAL THERAPY SOZO SCREENING NOTE   Patient Name: Elizabeth Cummings MRN: [id number] DOB:06/13/1975, 46 y.o., female Today's Date: 03/04/2022  PCP: Mayra Neer, MD REFERRING PROVIDER: Rolm Bookbinder, MD   PT End of Session - 03/04/22 1456     Visit Number 2   unchanged due to screen only   PT Start Time 1448    PT Stop Time 1453    PT Time Calculation (min) 5 min    Activity Tolerance Patient tolerated treatment well    Behavior During Therapy [facility] for tasks assessed/performed              Past Medical History:  Diagnosis Date   Allergy    seasonal, dogs, cats   Angioneurotic edema 05/08/2014   Cancer ([place]) 07/24/2021   Breast   Diabetes mellitus without complication ([place])    Leukocytosis 05/08/2014   Microcytic anemia 05/08/2014   Thrombocytosis 05/08/2014   Past Surgical History:  Procedure Laterality Date   APPENDECTOMY     BREAST BIOPSY Left 07/24/2021   MRI   BREAST BIOPSY Left 06/27/2021   U/S   BREAST LUMPECTOMY WITH RADIOACTIVE SEED AND SENTINEL LYMPH NODE BIOPSY Left 07/28/2021   Procedure: LEFT BREAST BRACKETED LUMPECTOMY WITH RADIOACTIVE SEED AND AXILLARY SENTINEL LYMPH NODE BIOPSY;  Surgeon: Rolm Bookbinder, MD;  Location: [facility];  Service: General;  Laterality: Left;   CESAREAN SECTION     CHOLECYSTECTOMY     WRIST SURGERY     Patient Active Problem List   Diagnosis Date Noted   Dermatographic urticaria 12/25/2021   Allergic rhinitis 12/25/2021   Genetic testing 07/25/2021   Malignant neoplasm of upper-outer quadrant of left breast in female, estrogen receptor positive ([place]) 07/10/2021   Migraine without status migrainosus, not intractable 04/02/2021   Hyperlipidemia associated with type 2 diabetes mellitus ([place]) 01/28/2021   Diabetes mellitus ([place]) 01/01/2021   Class 2 severe obesity with serious comorbidity and body mass index (BMI) of 36.0 to 36.9 in adult [facility]) 01/01/2021   Celiac disease 07/31/2019    Sensorineural hearing loss (SNHL) of left ear with unrestricted hearing of right ear 04/07/2019   Microcytic anemia 05/08/2014   Leukocytosis 05/08/2014   Thrombocytosis 05/08/2014   Angioneurotic edema 05/08/2014    REFERRING DIAG: left breast cancer at risk for lymphedema  THERAPY DIAG:  Malignant neoplasm of upper-outer quadrant of left breast in female, estrogen receptor positive ([place])  PERTINENT HISTORY: Patient was diagnosed on 06/27/2021 with left grade 1 Invasive Lobular Carcinoma. It measured 4 mm and is located in the upper outer quadrant. It is ER+, PR+, HER 2- with a Ki67 of 4%. Pt had a left bracketed lumpectomy with axillary SLNB on 07/28/2021. She does not require chemotherapy,but will need radiation and antiestrogens  PRECAUTIONS: left UE Lymphedema risk, None  SUBJECTIVE: Pt returns for her 3 month L-Dex screen.   PAIN:  Are you having pain? No  SOZO SCREENING: Patient was assessed today using the SOZO machine to determine the lymphedema index score. This was compared to her baseline score. It was determined that she is within the recommended range when compared to her baseline and no further action is needed at this time. She will continue SOZO screenings. These are done every 3 months for 2 years post operatively followed by every 6 months for 2 years, and then annually.  Pt mentioned that her breast felt heavy and was larger than it had been. Therapist assessed and noted lymphedema. An inbasket message will be  sent to Dr. Lindi Adie for a referral to PT to address this.   Allyson Sabal Meeker, PT 03/04/2022, 2:59 PM

## 2022-03-05 ENCOUNTER — Other Ambulatory Visit: Payer: Self-pay | Admitting: *Deleted

## 2022-03-05 DIAGNOSIS — Z170 Estrogen receptor positive status [ER+]: Secondary | ICD-10-CM

## 2022-03-05 DIAGNOSIS — I890 Lymphedema, not elsewhere classified: Secondary | ICD-10-CM

## 2022-03-09 DIAGNOSIS — M9903 Segmental and somatic dysfunction of lumbar region: Secondary | ICD-10-CM | POA: Diagnosis not present

## 2022-03-09 DIAGNOSIS — C50912 Malignant neoplasm of unspecified site of left female breast: Secondary | ICD-10-CM | POA: Diagnosis not present

## 2022-03-09 DIAGNOSIS — M62830 Muscle spasm of back: Secondary | ICD-10-CM | POA: Diagnosis not present

## 2022-03-10 DIAGNOSIS — E559 Vitamin D deficiency, unspecified: Secondary | ICD-10-CM | POA: Diagnosis not present

## 2022-03-10 DIAGNOSIS — R809 Proteinuria, unspecified: Secondary | ICD-10-CM | POA: Diagnosis not present

## 2022-03-10 DIAGNOSIS — R229 Localized swelling, mass and lump, unspecified: Secondary | ICD-10-CM | POA: Diagnosis not present

## 2022-03-10 DIAGNOSIS — C50919 Malignant neoplasm of unspecified site of unspecified female breast: Secondary | ICD-10-CM | POA: Diagnosis not present

## 2022-03-10 DIAGNOSIS — E1169 Type 2 diabetes mellitus with other specified complication: Secondary | ICD-10-CM | POA: Diagnosis not present

## 2022-03-10 DIAGNOSIS — E7800 Pure hypercholesterolemia, unspecified: Secondary | ICD-10-CM | POA: Diagnosis not present

## 2022-03-10 DIAGNOSIS — E669 Obesity, unspecified: Secondary | ICD-10-CM | POA: Diagnosis not present

## 2022-03-10 NOTE — Therapy (Incomplete)
OUTPATIENT PHYSICAL THERAPY ONCOLOGY EVALUATION  Patient Name: Elizabeth Cummings MRN: [id number] DOB:Jan 01, 1976, 46 y.o., female Today's Date: 03/10/2022  END OF SESSION:   Past Medical History:  Diagnosis Date   Allergy    seasonal, dogs, cats   Angioneurotic edema 05/08/2014   Cancer ([place]) 07/24/2021   Breast   Diabetes mellitus without complication ([place])    Leukocytosis 05/08/2014   Microcytic anemia 05/08/2014   Thrombocytosis 05/08/2014   Past Surgical History:  Procedure Laterality Date   APPENDECTOMY     BREAST BIOPSY Left 07/24/2021   MRI   BREAST BIOPSY Left 06/27/2021   U/S   BREAST LUMPECTOMY WITH RADIOACTIVE SEED AND SENTINEL LYMPH NODE BIOPSY Left 07/28/2021   Procedure: LEFT BREAST BRACKETED LUMPECTOMY WITH RADIOACTIVE SEED AND AXILLARY SENTINEL LYMPH NODE BIOPSY;  Surgeon: Rolm Bookbinder, MD;  Location: [place];  Service: General;  Laterality: Left;   CESAREAN SECTION     CHOLECYSTECTOMY     WRIST SURGERY     Patient Active Problem List   Diagnosis Date Noted   Dermatographic urticaria 12/25/2021   Allergic rhinitis 12/25/2021   Genetic testing 07/25/2021   Malignant neoplasm of upper-outer quadrant of left breast in female, estrogen receptor positive ([place]) 07/10/2021   Migraine without status migrainosus, not intractable 04/02/2021   Hyperlipidemia associated with type 2 diabetes mellitus ([place]) 01/28/2021   Diabetes mellitus ([place]) 01/01/2021   Class 2 severe obesity with serious comorbidity and body mass index (BMI) of 36.0 to 36.9 in adult [facility]) 01/01/2021   Celiac disease 07/31/2019   Sensorineural hearing loss (SNHL) of left ear with unrestricted hearing of right ear 04/07/2019   Microcytic anemia 05/08/2014   Leukocytosis 05/08/2014   Thrombocytosis 05/08/2014   Angioneurotic edema 05/08/2014    PCP: Mayra Neer, MD  REFERRING PROVIDER: Nicholas Lose  REFERRING DIAG: Left Breast Swelling  THERAPY DIAG:  No  diagnosis found.  ONSET DATE: ***  Rationale for Evaluation and Treatment: Rehabilitation  SUBJECTIVE:                                                                                                                                                                                           SUBJECTIVE STATEMENT:  PERTINENT HISTORY:   Patient was diagnosed on 06/27/2021 with left grade 1 Invasive Lobular Carcinoma. It measured 4 mm and is located in the upper outer quadrant. It is ER+, PR+, HER 2- with a Ki67 of 4%. Pt had a left bracketed lumpectomy with axillary SLNB on 07/28/2021. She did not require chemotherapy,but did have radiation and antiestrogens . Radiation ended 10/08/2021  PAIN:  Are you having pain? {  yes/no:20286} NPRS scale: ***/10 Pain location: *** Pain orientation: {Pain Orientation:25161}  PAIN TYPE: {type:313116} Pain description: {PAIN DESCRIPTION:21022940}  Aggravating factors: *** Relieving factors: ***  PRECAUTIONS: left UE lymphedema risk, thrombocytosis, microcytic anemia,DM  WEIGHT BEARING RESTRICTIONS: No  FALLS:  Has patient fallen in last 6 months? {fallsyesno:27318}  LIVING ENVIRONMENT: Lives with: {[facility] lives with:25569::"lives with their family"} Lives in: House/apartment Stairs: {yes/no:20286}; {Stairs:24000} Has following equipment at home: {Assistive devices:23999}  OCCUPATION: RN  LEISURE: ***  HAND DOMINANCE: right   PRIOR LEVEL OF FUNCTION: Independent  PATIENT GOALS: ***   OBJECTIVE:  COGNITION: Overall cognitive status: Within functional limits for tasks assessed   PALPATION: ***  OBSERVATIONS / OTHER ASSESSMENTS:  SENSATION: Light touch: {intact/deficits:24005}   POSTURE: forward head, rounded shoulders  UPPER EXTREMITY AROM/PROM:  A/PROM RIGHT   eval   Shoulder extension   Shoulder flexion   Shoulder abduction   Shoulder internal rotation   Shoulder external rotation     (Blank rows = not tested)  A/PROM LEFT    eval  Shoulder extension   Shoulder flexion   Shoulder abduction   Shoulder internal rotation   Shoulder external rotation     (Blank rows = not tested)  CERVICAL AROM: All within normal limits:   UPPER EXTREMITY STRENGTH: ***     LYMPHEDEMA ASSESSMENTS:   SURGERY TYPE/DATE: Left lumpectomy with SLNB  NUMBER OF LYMPH NODES REMOVED: 1 with isolated tumor cells  CHEMOTHERAPY: NO  RADIATION:Yes ended 09/2021  HORMONE TREATMENT: Yes  INFECTIONS: ***  LYMPHEDEMA ASSESSMENTS:   LANDMARK RIGHT  eval  10 cm proximal to olecranon process   Olecranon process   10 cm proximal to ulnar styloid process   Just proximal to ulnar styloid process   Across hand at thumb web space   At base of 2nd digit   (Blank rows = not tested)  LANDMARK LEFT  eval  10 cm proximal to olecranon process   Olecranon process   10 cm proximal to ulnar styloid process   Just proximal to ulnar styloid process   Across hand at thumb web space   At base of 2nd digit     QUICK DASH SURVEY: ***   TODAY'S TREATMENT:                                                                                                                                         DATE: ***  PATIENT EDUCATION:  Education details: *** Person educated: {Person educated:25204} Education method: {Education Method:25205} Education comprehension: {Education Comprehension:25206}  HOME EXERCISE PROGRAM: ***  ASSESSMENT:  CLINICAL IMPRESSION: Patient is a 46 y.o. female who was seen today for physical therapy evaluation and treatment for Left breast swelling. She is s/p a left lumpectomy with SLNB on 07/28/2021 which was followed by radiation and ended in July 2023..   OBJECTIVE IMPAIRMENTS: {opptimpairments:25111}.   ACTIVITY LIMITATIONS: {activitylimitations:27494}  PARTICIPATION LIMITATIONS: {  participationrestrictions:25113}  PERSONAL FACTORS: {Personal factors:25162} are also affecting patient's functional outcome.    REHAB POTENTIAL: Good  CLINICAL DECISION MAKING: Stable/uncomplicated  EVALUATION COMPLEXITY: Low  GOALS: Goals reviewed with patient? Yes  SHORT TERM GOALS: Target date: ***  *** Baseline: Goal status: {GOALSTATUS:25110}  2.  *** Baseline:  Goal status: {GOALSTATUS:25110}  3.  *** Baseline:  Goal status: {GOALSTATUS:25110}  4.  *** Baseline:  Goal status: {GOALSTATUS:25110}  5.  *** Baseline:  Goal status: {GOALSTATUS:25110}  6.  *** Baseline:  Goal status: {GOALSTATUS:25110}  LONG TERM GOALS: Target date: ***  *** Baseline:  Goal status: {GOALSTATUS:25110}  2.  *** Baseline:  Goal status: {GOALSTATUS:25110}  3.  *** Baseline:  Goal status: {GOALSTATUS:25110}  4.  *** Baseline:  Goal status: {GOALSTATUS:25110}  5.  *** Baseline:  Goal status: {GOALSTATUS:25110}  6.  *** Baseline:  Goal status: {GOALSTATUS:25110}  PLAN:  PT FREQUENCY: {rehab frequency:25116}  PT DURATION: {rehab duration:25117}  PLANNED INTERVENTIONS: {rehab planned interventions:25118::"Therapeutic exercises","Therapeutic activity","Neuromuscular re-education","Balance training","Gait training","Patient/Family education","Self Care","Joint mobilization"}  PLAN FOR NEXT SESSION: ***   Claris Pong, PT 03/10/2022, 6:06 PM

## 2022-03-11 ENCOUNTER — Ambulatory Visit: Payer: 59 | Attending: Hematology and Oncology | Admitting: Rehabilitation

## 2022-03-11 ENCOUNTER — Other Ambulatory Visit: Payer: Self-pay | Admitting: Family Medicine

## 2022-03-11 ENCOUNTER — Encounter: Payer: Self-pay | Admitting: Rehabilitation

## 2022-03-11 ENCOUNTER — Other Ambulatory Visit: Payer: Self-pay

## 2022-03-11 DIAGNOSIS — C50412 Malignant neoplasm of upper-outer quadrant of left female breast: Secondary | ICD-10-CM | POA: Diagnosis not present

## 2022-03-11 DIAGNOSIS — I890 Lymphedema, not elsewhere classified: Secondary | ICD-10-CM | POA: Insufficient documentation

## 2022-03-11 DIAGNOSIS — Z170 Estrogen receptor positive status [ER+]: Secondary | ICD-10-CM | POA: Insufficient documentation

## 2022-03-11 DIAGNOSIS — R229 Localized swelling, mass and lump, unspecified: Secondary | ICD-10-CM

## 2022-03-11 DIAGNOSIS — R293 Abnormal posture: Secondary | ICD-10-CM

## 2022-03-11 DIAGNOSIS — Z483 Aftercare following surgery for neoplasm: Secondary | ICD-10-CM

## 2022-03-11 DIAGNOSIS — M25512 Pain in left shoulder: Secondary | ICD-10-CM | POA: Diagnosis not present

## 2022-03-11 NOTE — Therapy (Signed)
OUTPATIENT PHYSICAL THERAPY ONCOLOGY EVALUATION  Patient Name: Elizabeth Cummings MRN: [id number] DOB:[birth year], 46 y.o., female Today's Date: 03/11/2022  END OF SESSION:  PT End of Session - 03/11/22 1355     Visit Number 1    Number of Visits 9    Date for PT Re-Evaluation 04/22/22    PT Start Time 8677    PT Stop Time 1448    PT Time Calculation (min) 46 min    Activity Tolerance Patient tolerated treatment well    Behavior During Therapy [facility] for tasks assessed/performed             Past Medical History:  Diagnosis Date   Allergy    seasonal, dogs, cats   Angioneurotic edema 05/08/2014   Cancer ([place]) 07/24/2021   Breast   Diabetes mellitus without complication ([place])    Leukocytosis 05/08/2014   Microcytic anemia 05/08/2014   Thrombocytosis 05/08/2014   Past Surgical History:  Procedure Laterality Date   APPENDECTOMY     BREAST BIOPSY Left 07/24/2021   MRI   BREAST BIOPSY Left 06/27/2021   U/S   BREAST LUMPECTOMY WITH RADIOACTIVE SEED AND SENTINEL LYMPH NODE BIOPSY Left 07/28/2021   Procedure: LEFT BREAST BRACKETED LUMPECTOMY WITH RADIOACTIVE SEED AND AXILLARY SENTINEL LYMPH NODE BIOPSY;  Surgeon: Rolm Bookbinder, MD;  Location: [place];  Service: General;  Laterality: Left;   CESAREAN SECTION     CHOLECYSTECTOMY     WRIST SURGERY     Patient Active Problem List   Diagnosis Date Noted   Dermatographic urticaria 12/25/2021   Allergic rhinitis 12/25/2021   Genetic testing 07/25/2021   Malignant neoplasm of upper-outer quadrant of left breast in female, estrogen receptor positive ([place]) 07/10/2021   Migraine without status migrainosus, not intractable 04/02/2021   Hyperlipidemia associated with type 2 diabetes mellitus ([place]) 01/28/2021   Diabetes mellitus ([place]) 01/01/2021   Class 2 severe obesity with serious comorbidity and body mass index (BMI) of 36.0 to 36.9 in adult [facility]) 01/01/2021   Celiac disease 07/31/2019   Sensorineural hearing  loss (SNHL) of left ear with unrestricted hearing of right ear 04/07/2019   Microcytic anemia 05/08/2014   Leukocytosis 05/08/2014   Thrombocytosis 05/08/2014   Angioneurotic edema 05/08/2014    PCP: Dr Mayra Neer  REFERRING PROVIDER: Dr. Lindi Adie   REFERRING DIAG:  C50.412,Z17.0 (ICD-10-CM) - Malignant neoplasm of upper-outer quadrant of left breast in female, estrogen receptor positive ([place])  I89.0 (ICD-10-CM) - Lymphedema of breast    THERAPY DIAG:  Malignant neoplasm of upper-outer quadrant of left breast in female, estrogen receptor positive ([place])  Abnormal posture  Aftercare following surgery for neoplasm  Lymphedema, not elsewhere classified  ONSET DATE: 10/08/21  Rationale for Evaluation and Treatment: Rehabilitation  SUBJECTIVE:  SUBJECTIVE STATEMENT: Over the past 2 months it started to get more swollen and heavy.  I got my correct sized bra this week and it is helping.  My shoulder is also tighter the past few weeks.    PERTINENT HISTORY:  Patient was diagnosed on 06/27/2021 with left grade 1 Invasive Lobular Carcinoma. It measured 4 mm and is located in the upper outer quadrant. It is ER+, PR+, HER 2- with a Ki67 of 4%. Pt had a left bracketed lumpectomy with axillary SLNB on 07/28/2021. Completed radiation 10/08/21.  PAIN:  Are you having pain? No It is tender to touch around 1/10  PRECAUTIONS: Lt lymphedema risk, radiation hx  WEIGHT BEARING RESTRICTIONS: No  FALLS:  Has patient fallen in last 6 months? 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     OBJECTIVE:  COGNITION: Overall cognitive status: Within functional limits for tasks assessed   PALPATION: Moderate fibrosis medial inferior breast, mild fibrosis anterior breast - increased scar tissue lumpectomy incision, axillary congestion, pectoralis tightness.   OBSERVATIONS / OTHER ASSESSMENTS: enlarged pores, firm skin medial breast  UPPER EXTREMITY AROM/PROM:  A/PROM RIGHT   eval   Shoulder extension   Shoulder flexion   Shoulder abduction   Shoulder internal rotation   Shoulder external rotation     (Blank rows = not tested)  A/PROM LEFT   eval  Shoulder extension 70  Shoulder flexion 165 - tight in the back of the shoulder  Shoulder abduction 170 - pull in axilla   Shoulder internal rotation   Shoulder external rotation 85 - tight top of shoulder - pn    (Blank rows = not tested)  BREAST COMPLAINTS QUESTIONNAIRE Pain:   0 Heaviness:   4 Swollen feeling: 4 Tense Skin: 4 Redness: 2 Bra Print: 3 Size of Pores: 3 Hard feeling:  4 Total:   24  /80 A Score over 9 indicates lymphedema issues in the breast   TODAY'S TREATMENT:                                                                                                                                         DATE:  03/11/22 With pt permission for breast MLD Education on lymphatic anatomy and drainage patterns of the breast and principles of MLD Performed each step in supine per instruction section below with PT reading and then performing each step and then with hand over hand cueing and performance by pt with modification as needed - noted below.  Made pt foam chip pack for use in fibrotic breast locations with instruction on using 51mn-1 hour to start and then as needed  PATIENT EDUCATION:  Education details: Self MLD Person  educated: Patient Education method: Explanation, Demonstration, Tactile cues, Verbal cues, and Handouts Education comprehension: verbalized understanding and needs further  education  HOME EXERCISE PROGRAM: Self MLD  ASSESSMENT:  CLINICAL IMPRESSION: Patient is a 46 y.o. female who was seen today for physical therapy evaluation and treatment for her new onset left breast lymphedema after radiation. She scores a 24/80 on the Breast complaints questionnaire which demonstrates lymphedema.  She is having minimal pain in the breast but more noticeable pain in the shoulder lately with overhead motions.  She has mildly decreased AROM from baseline with more of a tightness in the anterior chest with testing.  She noted improvements with a short session of MLD today.    OBJECTIVE IMPAIRMENTS: decreased knowledge of condition, increased edema, increased fascial restrictions, and impaired flexibility.   ACTIVITY LIMITATIONS: reach over head  PARTICIPATION LIMITATIONS: none  PERSONAL FACTORS: 1-2 comorbidities: SLNB, radiation hx  are also affecting patient's functional outcome.   REHAB POTENTIAL: Excellent  CLINICAL DECISION MAKING: Stable/uncomplicated  EVALUATION COMPLEXITY: Low  GOALS: Goals reviewed with patient? Yes   LONG TERM GOALS: Target date: 03/11/22  Pt will decrease breast complaints questionnaire to 15 or less to decrease risk of infection  Baseline:  Goal status: INITIAL  2.  Pt will be ind with self MLD and compression Baseline:  Goal status: INITIAL  3.  Pt will return to baseline AROM Baseline:  Goal status: INITIAL   PLAN:  PT FREQUENCY: 2x/week  PT DURATION: 6 weeks  PLANNED INTERVENTIONS: Therapeutic exercises, Patient/Family education, Self Care, DME instructions, Manual therapy, and Re-evaluation, taping  PLAN FOR NEXT SESSION: review breast MLD, perform Lt breast MLD, axillary decongestion, STM, [place], PT 03/11/2022, 2:49 PM

## 2022-03-12 ENCOUNTER — Ambulatory Visit: Payer: 59 | Admitting: Physical Therapy

## 2022-03-12 ENCOUNTER — Encounter: Payer: Self-pay | Admitting: Physical Therapy

## 2022-03-12 DIAGNOSIS — M25512 Pain in left shoulder: Secondary | ICD-10-CM | POA: Diagnosis not present

## 2022-03-12 DIAGNOSIS — I890 Lymphedema, not elsewhere classified: Secondary | ICD-10-CM | POA: Diagnosis not present

## 2022-03-12 DIAGNOSIS — C50412 Malignant neoplasm of upper-outer quadrant of left female breast: Secondary | ICD-10-CM | POA: Diagnosis not present

## 2022-03-12 DIAGNOSIS — Z483 Aftercare following surgery for neoplasm: Secondary | ICD-10-CM

## 2022-03-12 DIAGNOSIS — Z170 Estrogen receptor positive status [ER+]: Secondary | ICD-10-CM | POA: Diagnosis not present

## 2022-03-12 DIAGNOSIS — R293 Abnormal posture: Secondary | ICD-10-CM

## 2022-03-12 NOTE — Therapy (Signed)
OUTPATIENT PHYSICAL THERAPY ONCOLOGY EVALUATION  Patient Name: Elizabeth Cummings MRN: [id number] DOB:03/21/1976, 46 y.o., female Today's Date: 03/12/2022  END OF SESSION:  PT End of Session - 03/12/22 1403     Visit Number 2    Number of Visits 9    Date for PT Re-Evaluation 04/22/22    PT Start Time 8676    PT Stop Time 1456    PT Time Calculation (min) 54 min    Activity Tolerance Patient tolerated treatment well    Behavior During Therapy [facility] for tasks assessed/performed             Past Medical History:  Diagnosis Date   Allergy    seasonal, dogs, cats   Angioneurotic edema 05/08/2014   Cancer ([place]) 07/24/2021   Breast   Diabetes mellitus without complication ([place])    Leukocytosis 05/08/2014   Microcytic anemia 05/08/2014   Thrombocytosis 05/08/2014   Past Surgical History:  Procedure Laterality Date   APPENDECTOMY     BREAST BIOPSY Left 07/24/2021   MRI   BREAST BIOPSY Left 06/27/2021   U/S   BREAST LUMPECTOMY WITH RADIOACTIVE SEED AND SENTINEL LYMPH NODE BIOPSY Left 07/28/2021   Procedure: LEFT BREAST BRACKETED LUMPECTOMY WITH RADIOACTIVE SEED AND AXILLARY SENTINEL LYMPH NODE BIOPSY;  Surgeon: Rolm Bookbinder, MD;  Location: [place];  Service: General;  Laterality: Left;   CESAREAN SECTION     CHOLECYSTECTOMY     WRIST SURGERY     Patient Active Problem List   Diagnosis Date Noted   Dermatographic urticaria 12/25/2021   Allergic rhinitis 12/25/2021   Genetic testing 07/25/2021   Malignant neoplasm of upper-outer quadrant of left breast in female, estrogen receptor positive ([place]) 07/10/2021   Migraine without status migrainosus, not intractable 04/02/2021   Hyperlipidemia associated with type 2 diabetes mellitus ([place]) 01/28/2021   Diabetes mellitus ([place]) 01/01/2021   Class 2 severe obesity with serious comorbidity and body mass index (BMI) of 36.0 to 36.9 in adult [facility]) 01/01/2021   Celiac disease 07/31/2019   Sensorineural hearing  loss (SNHL) of left ear with unrestricted hearing of right ear 04/07/2019   Microcytic anemia 05/08/2014   Leukocytosis 05/08/2014   Thrombocytosis 05/08/2014   Angioneurotic edema 05/08/2014    PCP: Dr Mayra Neer  REFERRING PROVIDER: Dr. Lindi Adie   REFERRING DIAG:  C50.412,Z17.0 (ICD-10-CM) - Malignant neoplasm of upper-outer quadrant of left breast in female, estrogen receptor positive ([place])  I89.0 (ICD-10-CM) - Lymphedema of breast    THERAPY DIAG:  Lymphedema, not elsewhere classified  Aftercare following surgery for neoplasm  Abnormal posture  Malignant neoplasm of upper-outer quadrant of left breast in female, estrogen receptor positive ([place])  ONSET DATE: 10/08/21  Rationale for Evaluation and Treatment: Rehabilitation  SUBJECTIVE:  SUBJECTIVE STATEMENT: The breast felt softer and less heavy after last session.   PERTINENT HISTORY:  Patient was diagnosed on 06/27/2021 with left grade 1 Invasive Lobular Carcinoma. It measured 4 mm and is located in the upper outer quadrant. It is ER+, PR+, HER 2- with a Ki67 of 4%. Pt had a left bracketed lumpectomy with axillary SLNB on 07/28/2021. Completed radiation 10/08/21.  PAIN:  Are you having pain? No Pt just reports tightness in L axilla  PRECAUTIONS: Lt lymphedema risk, radiation hx  WEIGHT BEARING RESTRICTIONS: No  FALLS:  Has patient fallen in last 6 months? 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    OBJECTIVE:  COGNITION: Overall cognitive status: Within functional limits for tasks  assessed   PALPATION: Moderate fibrosis medial inferior breast, mild fibrosis anterior breast - increased scar tissue lumpectomy incision, axillary congestion, pectoralis tightness.   OBSERVATIONS / OTHER ASSESSMENTS: enlarged pores, firm skin medial breast  UPPER EXTREMITY AROM/PROM:  A/PROM RIGHT   eval   Shoulder extension   Shoulder flexion   Shoulder abduction   Shoulder internal rotation   Shoulder external rotation     (Blank rows = not tested)  A/PROM LEFT   eval  Shoulder extension 70  Shoulder flexion 165 - tight in the back of the shoulder  Shoulder abduction 170 - pull in axilla   Shoulder internal rotation   Shoulder external rotation 85 - tight top of shoulder - pn    (Blank rows = not tested)  BREAST COMPLAINTS QUESTIONNAIRE Pain:   0 Heaviness:   4 Swollen feeling: 4 Tense Skin: 4 Redness: 2 Bra Print: 3 Size of Pores: 3 Hard feeling:  4 Total:   24  /80 A Score over 9 indicates lymphedema issues in the breast   TODAY'S TREATMENT:                                                                                                                                         DATE:  03/12/22 In supine: Short neck, 5 diaphragmatic breaths, R axillary nodes and establishment of interaxillary pathway, L inguinal nodes and establishment of axilloinguinal pathway, then L breast moving fluid towards pathways spending extra time in any areas of fibrosis then retracing all steps. STM to L axilla in area of scar tissue   03/11/22 With pt permission for breast MLD Education on lymphatic anatomy and drainage patterns of the breast and principles of MLD Performed each step in supine per instruction section below with PT reading and then performing each step and then with hand over hand cueing and performance by pt with modification as needed - noted below.  Made pt foam chip pack  for use in fibrotic breast locations with instruction on using 42mn-1 hour to start and then as  needed  PATIENT EDUCATION:  Education details: Self MLD Person educated: Patient Education method: Explanation, Demonstration, Tactile cues, Verbal cues, and Handouts Education comprehension: verbalized understanding and needs further education  HOME EXERCISE PROGRAM: Self MLD  ASSESSMENT:  CLINICAL IMPRESSION: Continued with MLD today. Fibrosis softened during session today. Pt reports she has been practicing self MLD at home and waering her compression bra. She can already feel some improvement in her breast after last session. Began soft tissue mobilization in L axilla to improve comfort.    OBJECTIVE IMPAIRMENTS: decreased knowledge of condition, increased edema, increased fascial restrictions, and impaired flexibility.   ACTIVITY LIMITATIONS: reach over head  PARTICIPATION LIMITATIONS: none  PERSONAL FACTORS: 1-2 comorbidities: SLNB, radiation hx  are also affecting patient's functional outcome.   REHAB POTENTIAL: Excellent  CLINICAL DECISION MAKING: Stable/uncomplicated  EVALUATION COMPLEXITY: Low  GOALS: Goals reviewed with patient? Yes   LONG TERM GOALS: Target date: 03/11/22  Pt will decrease breast complaints questionnaire to 15 or less to decrease risk of infection  Baseline:  Goal status: INITIAL  2.  Pt will be ind with self MLD and compression Baseline:  Goal status: INITIAL  3.  Pt will return to baseline AROM Baseline:  Goal status: INITIAL   PLAN:  PT FREQUENCY: 2x/week  PT DURATION: 6 weeks  PLANNED INTERVENTIONS: Therapeutic exercises, Patient/Family education, Self Care, DME instructions, Manual therapy, and Re-evaluation, taping  PLAN FOR NEXT SESSION: review breast MLD, perform Lt breast MLD, axillary decongestion, STM, AAROM   Albaraa Swingle [place], PT 03/12/2022, 3:03 PM

## 2022-03-17 ENCOUNTER — Encounter: Payer: Self-pay | Admitting: Hematology and Oncology

## 2022-03-17 ENCOUNTER — Ambulatory Visit: Payer: 59

## 2022-03-17 DIAGNOSIS — Z170 Estrogen receptor positive status [ER+]: Secondary | ICD-10-CM

## 2022-03-17 DIAGNOSIS — I890 Lymphedema, not elsewhere classified: Secondary | ICD-10-CM

## 2022-03-17 DIAGNOSIS — Z483 Aftercare following surgery for neoplasm: Secondary | ICD-10-CM

## 2022-03-17 DIAGNOSIS — R293 Abnormal posture: Secondary | ICD-10-CM

## 2022-03-17 DIAGNOSIS — C50412 Malignant neoplasm of upper-outer quadrant of left female breast: Secondary | ICD-10-CM | POA: Diagnosis not present

## 2022-03-17 NOTE — Therapy (Signed)
OUTPATIENT PHYSICAL THERAPY ONCOLOGY EVALUATION  Patient Name: Elizabeth Cummings MRN: [id number] DOB:[birth year], 46 y.o., female Today's Date: 03/17/2022  END OF SESSION:  PT End of Session - 03/17/22 1156     Visit Number 3    Number of Visits 9    Date for PT Re-Evaluation 04/22/22    PT Start Time 2549    PT Stop Time 1245    PT Time Calculation (min) 47 min    Activity Tolerance Patient tolerated treatment well    Behavior During Therapy [facility] for tasks assessed/performed             Past Medical History:  Diagnosis Date   Allergy    seasonal, dogs, cats   Angioneurotic edema 05/08/2014   Cancer ([place]) 07/24/2021   Breast   Diabetes mellitus without complication ([place])    Leukocytosis 05/08/2014   Microcytic anemia 05/08/2014   Thrombocytosis 05/08/2014   Past Surgical History:  Procedure Laterality Date   APPENDECTOMY     BREAST BIOPSY Left 07/24/2021   MRI   BREAST BIOPSY Left 06/27/2021   U/S   BREAST LUMPECTOMY WITH RADIOACTIVE SEED AND SENTINEL LYMPH NODE BIOPSY Left 07/28/2021   Procedure: LEFT BREAST BRACKETED LUMPECTOMY WITH RADIOACTIVE SEED AND AXILLARY SENTINEL LYMPH NODE BIOPSY;  Surgeon: Rolm Bookbinder, MD;  Location: [place];  Service: General;  Laterality: Left;   CESAREAN SECTION     CHOLECYSTECTOMY     WRIST SURGERY     Patient Active Problem List   Diagnosis Date Noted   Dermatographic urticaria 12/25/2021   Allergic rhinitis 12/25/2021   Genetic testing 07/25/2021   Malignant neoplasm of upper-outer quadrant of left breast in female, estrogen receptor positive ([place]) 07/10/2021   Migraine without status migrainosus, not intractable 04/02/2021   Hyperlipidemia associated with type 2 diabetes mellitus ([place]) 01/28/2021   Diabetes mellitus ([place]) 01/01/2021   Class 2 severe obesity with serious comorbidity and body mass index (BMI) of 36.0 to 36.9 in adult [facility]) 01/01/2021   Celiac disease 07/31/2019   Sensorineural hearing  loss (SNHL) of left ear with unrestricted hearing of right ear 04/07/2019   Microcytic anemia 05/08/2014   Leukocytosis 05/08/2014   Thrombocytosis 05/08/2014   Angioneurotic edema 05/08/2014    PCP: Dr Mayra Neer  REFERRING PROVIDER: Dr. Lindi Adie   REFERRING DIAG:  C50.412,Z17.0 (ICD-10-CM) - Malignant neoplasm of upper-outer quadrant of left breast in female, estrogen receptor positive ([place])  I89.0 (ICD-10-CM) - Lymphedema of breast    THERAPY DIAG:  Lymphedema, not elsewhere classified  Aftercare following surgery for neoplasm  Abnormal posture  Malignant neoplasm of upper-outer quadrant of left breast in female, estrogen receptor positive ([place])  ONSET DATE: 10/08/21  Rationale for Evaluation and Treatment: Rehabilitation  SUBJECTIVE:  SUBJECTIVE STATEMENT:  I am using the foam pad and wearing the compression bra. I think they help with the fibrosis. Pt is compliant with MLD. She is having some soreness at the left chest region.   PERTINENT HISTORY:  Patient was diagnosed on 06/27/2021 with left grade 1 Invasive Lobular Carcinoma. It measured 4 mm and is located in the upper outer quadrant. It is ER+, PR+, HER 2- with a Ki67 of 4%. Pt had a left bracketed lumpectomy with axillary SLNB on 07/28/2021. Completed radiation 10/08/21.  PAIN:  Are you having pain? No Pt just reports tightness in L axilla  PRECAUTIONS: Lt lymphedema risk, radiation hx  WEIGHT BEARING RESTRICTIONS: No  FALLS:  Has patient fallen in last 6 months? 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     OBJECTIVE:  COGNITION: Overall cognitive status: Within functional limits for tasks assessed   PALPATION: Moderate fibrosis medial inferior breast, mild fibrosis anterior breast - increased scar tissue lumpectomy incision, axillary congestion, pectoralis tightness.   OBSERVATIONS / OTHER ASSESSMENTS: enlarged pores, firm skin medial breast  UPPER EXTREMITY AROM/PROM:  A/PROM RIGHT   eval   Shoulder extension   Shoulder flexion   Shoulder abduction   Shoulder internal rotation   Shoulder external rotation     (Blank rows = not tested)  A/PROM LEFT   eval  Shoulder extension 70  Shoulder flexion 165 - tight in the back of the shoulder  Shoulder abduction 170 - pull in axilla   Shoulder internal rotation   Shoulder external rotation 85 - tight top of shoulder - pn    (Blank rows = not tested)  BREAST COMPLAINTS QUESTIONNAIRE Pain:   0 Heaviness:   4 Swollen feeling: 4 Tense Skin: 4 Redness: 2 Bra Print: 3 Size of Pores: 3 Hard feeling:  4 Total:   24  /80 A Score over 9 indicates lymphedema issues in the breast   TODAY'S TREATMENT:                                                                                                                                         DATE:  03/17/2022 In supine: Short neck, 5 diaphragmatic breaths, R axillary nodes and establishment of interaxillary pathway, L inguinal nodes and establishment of axilloinguinal pathway, then L breast moving fluid towards pathways spending extra time in any areas of fibrosis then retracing all steps. Had pt also practice all steps and modified her technique slightly. [place] x 5 with tightness noted on left STM left pectorals, Lats, [place] PROM left shoulder flexion, scaption, abduction. Gave pt pics of post op exercises to assist ROM   03/12/22 In supine: Short neck, 5 diaphragmatic  breaths, R axillary nodes and establishment of interaxillary pathway, L inguinal nodes and establishment of  axilloinguinal pathway, then L breast moving fluid towards pathways spending extra time in any areas of fibrosis then retracing all steps. STM to L axilla in area of scar tissue   03/11/22 With pt permission for breast MLD Education on lymphatic anatomy and drainage patterns of the breast and principles of MLD Performed each step in supine per instruction section below with PT reading and then performing each step and then with hand over hand cueing and performance by pt with modification as needed - noted below.  Made pt foam chip pack for use in fibrotic breast locations with instruction on using 23mn-1 hour to start and then as needed  PATIENT EDUCATION:  Education details: Self MLD Person educated: Patient Education method: Explanation, Demonstration, Tactile cues, Verbal cues, and Handouts Education comprehension: verbalized understanding and needs further education  HOME EXERCISE PROGRAM: Self MLD  ASSESSMENT:  CLINICAL IMPRESSION: Continued with MLD today and reviewed with pt making a few minor modifications.  Also added soft tissue work due to pt soreness in left chest area, but minimal tightness noted today. Her axillary region was tight with PROM today.  OBJECTIVE IMPAIRMENTS: decreased knowledge of condition, increased edema, increased fascial restrictions, and impaired flexibility.   ACTIVITY LIMITATIONS: reach over head  PARTICIPATION LIMITATIONS: none  PERSONAL FACTORS: 1-2 comorbidities: SLNB, radiation hx  are also affecting patient's functional outcome.   REHAB POTENTIAL: Excellent  CLINICAL DECISION MAKING: Stable/uncomplicated  EVALUATION COMPLEXITY: Low  GOALS: Goals reviewed with patient? Yes   LONG TERM GOALS: Target date: 03/11/22  Pt will decrease breast complaints questionnaire to 15 or less to decrease risk of infection  Baseline:  Goal status: INITIAL  2.  Pt will be ind with self MLD and compression Baseline:  Goal status: INITIAL  3.  Pt  will return to baseline AROM Baseline:  Goal status: INITIAL   PLAN:  PT FREQUENCY: 2x/week  PT DURATION: 6 weeks  PLANNED INTERVENTIONS: Therapeutic exercises, Patient/Family education, Self Care, DME instructions, Manual therapy, and Re-evaluation, taping  PLAN FOR NEXT SESSION: review breast MLD, perform Lt breast MLD, axillary decongestion, STM, AMarlane Mingle PT 03/17/2022, 12:52 PM

## 2022-03-18 ENCOUNTER — Telehealth: Payer: Self-pay | Admitting: *Deleted

## 2022-03-18 ENCOUNTER — Ambulatory Visit: Payer: 59

## 2022-03-18 ENCOUNTER — Ambulatory Visit
Admission: RE | Admit: 2022-03-18 | Discharge: 2022-03-18 | Disposition: A | Discharge status: Home / Self Care | Payer: 59 | Source: Ambulatory Visit | Attending: Family Medicine | Admitting: Family Medicine

## 2022-03-18 DIAGNOSIS — R229 Localized swelling, mass and lump, unspecified: Secondary | ICD-10-CM

## 2022-03-18 DIAGNOSIS — R2242 Localized swelling, mass and lump, left lower limb: Secondary | ICD-10-CM | POA: Diagnosis not present

## 2022-03-18 NOTE — Telephone Encounter (Signed)
RN reviewed recent estradiol labs with NP who states pt is not post menopausal and needs to continue on Tamoxifen at this time.  NP recommends pt having labs rechecked in 6-12 months.  Pt educated and verbalized understanding.

## 2022-03-19 ENCOUNTER — Other Ambulatory Visit (HOSPITAL_COMMUNITY): Payer: Self-pay

## 2022-03-19 ENCOUNTER — Ambulatory Visit: Payer: 59

## 2022-03-19 DIAGNOSIS — C50412 Malignant neoplasm of upper-outer quadrant of left female breast: Secondary | ICD-10-CM | POA: Diagnosis not present

## 2022-03-19 DIAGNOSIS — Z483 Aftercare following surgery for neoplasm: Secondary | ICD-10-CM

## 2022-03-19 DIAGNOSIS — M9903 Segmental and somatic dysfunction of lumbar region: Secondary | ICD-10-CM | POA: Diagnosis not present

## 2022-03-19 DIAGNOSIS — M25512 Pain in left shoulder: Secondary | ICD-10-CM | POA: Diagnosis not present

## 2022-03-19 DIAGNOSIS — M62830 Muscle spasm of back: Secondary | ICD-10-CM | POA: Diagnosis not present

## 2022-03-19 DIAGNOSIS — Z170 Estrogen receptor positive status [ER+]: Secondary | ICD-10-CM

## 2022-03-19 DIAGNOSIS — I890 Lymphedema, not elsewhere classified: Secondary | ICD-10-CM

## 2022-03-19 DIAGNOSIS — R293 Abnormal posture: Secondary | ICD-10-CM

## 2022-03-19 NOTE — Therapy (Signed)
OUTPATIENT PHYSICAL THERAPY ONCOLOGY TREATMENT  Patient Name: Elizabeth Cummings MRN: [id number] DOB:02/23/1976, 46 y.o., female Today's Date: 03/19/2022  END OF SESSION:  PT End of Session - 03/19/22 1211     Visit Number 4    Number of Visits 9    Date for PT Re-Evaluation 04/22/22    PT Start Time 1205    PT Stop Time 1388    PT Time Calculation (min) 60 min    Activity Tolerance Patient tolerated treatment well    Behavior During Therapy [facility] for tasks assessed/performed             Past Medical History:  Diagnosis Date   Allergy    seasonal, dogs, cats   Angioneurotic edema 05/08/2014   Cancer ([place]) 07/24/2021   Breast   Diabetes mellitus without complication ([place])    Leukocytosis 05/08/2014   Microcytic anemia 05/08/2014   Thrombocytosis 05/08/2014   Past Surgical History:  Procedure Laterality Date   APPENDECTOMY     BREAST BIOPSY Left 07/24/2021   MRI   BREAST BIOPSY Left 06/27/2021   U/S   BREAST LUMPECTOMY WITH RADIOACTIVE SEED AND SENTINEL LYMPH NODE BIOPSY Left 07/28/2021   Procedure: LEFT BREAST BRACKETED LUMPECTOMY WITH RADIOACTIVE SEED AND AXILLARY SENTINEL LYMPH NODE BIOPSY;  Surgeon: Rolm Bookbinder, MD;  Location: [place];  Service: General;  Laterality: Left;   CESAREAN SECTION     CHOLECYSTECTOMY     WRIST SURGERY     Patient Active Problem List   Diagnosis Date Noted   Dermatographic urticaria 12/25/2021   Allergic rhinitis 12/25/2021   Genetic testing 07/25/2021   Malignant neoplasm of upper-outer quadrant of left breast in female, estrogen receptor positive ([place]) 07/10/2021   Migraine without status migrainosus, not intractable 04/02/2021   Hyperlipidemia associated with type 2 diabetes mellitus ([place]) 01/28/2021   Diabetes mellitus ([place]) 01/01/2021   Class 2 severe obesity with serious comorbidity and body mass index (BMI) of 36.0 to 36.9 in adult [facility]) 01/01/2021   Celiac disease 07/31/2019   Sensorineural hearing  loss (SNHL) of left ear with unrestricted hearing of right ear 04/07/2019   Microcytic anemia 05/08/2014   Leukocytosis 05/08/2014   Thrombocytosis 05/08/2014   Angioneurotic edema 05/08/2014    PCP: Dr Mayra Neer  REFERRING PROVIDER: Dr. Lindi Adie   REFERRING DIAG:  C50.412,Z17.0 (ICD-10-CM) - Malignant neoplasm of upper-outer quadrant of left breast in female, estrogen receptor positive ([place])  I89.0 (ICD-10-CM) - Lymphedema of breast    THERAPY DIAG:  Lymphedema, not elsewhere classified  Aftercare following surgery for neoplasm  Abnormal posture  Malignant neoplasm of upper-outer quadrant of left breast in female, estrogen receptor positive ([place])  ONSET DATE: 10/08/21  Rationale for Evaluation and Treatment: Rehabilitation  SUBJECTIVE:  SUBJECTIVE STATEMENT:  The self MLD is going well and I can tell the tightness in my axilla is also improving.   PERTINENT HISTORY:  Patient was diagnosed on 06/27/2021 with left grade 1 Invasive Lobular Carcinoma. It measured 4 mm and is located in the upper outer quadrant. It is ER+, PR+, HER 2- with a Ki67 of 4%. Pt had a left bracketed lumpectomy with axillary SLNB on 07/28/2021. Completed radiation 10/08/21.  PAIN:  Are you having pain? No Pt just reports tightness in L axilla  PRECAUTIONS: Lt lymphedema risk, radiation hx  WEIGHT BEARING RESTRICTIONS: No  FALLS:  Has patient fallen in last 6 months? 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    OBJECTIVE:  COGNITION: Overall cognitive status: Within functional limits for  tasks assessed   PALPATION: Moderate fibrosis medial inferior breast, mild fibrosis anterior breast - increased scar tissue lumpectomy incision, axillary congestion, pectoralis tightness.   OBSERVATIONS / OTHER ASSESSMENTS: enlarged pores, firm skin medial breast  UPPER EXTREMITY AROM/PROM:  A/PROM RIGHT   eval   Shoulder extension   Shoulder flexion   Shoulder abduction   Shoulder internal rotation   Shoulder external rotation     (Blank rows = not tested)  A/PROM LEFT   eval  Shoulder extension 70  Shoulder flexion 165 - tight in the back of the shoulder  Shoulder abduction 170 - pull in axilla   Shoulder internal rotation   Shoulder external rotation 85 - tight top of shoulder - pn    (Blank rows = not tested)  BREAST COMPLAINTS QUESTIONNAIRE Pain:   0 Heaviness:   4 Swollen feeling: 4 Tense Skin: 4 Redness: 2 Bra Print: 3 Size of Pores: 3 Hard feeling:  4 Total:   24  /80 A Score over 9 indicates lymphedema issues in the breast   TODAY'S TREATMENT:                                                                                                                                         DATE:  03/19/22: Manual Therapy MLD: In supine: Short neck, superficial and deep abdominals, Rt axillary nodes, Rt intact upper quadrant sequence and then establishment of anterior inter-axillary pathway, Lt inguinal nodes and establishment of Lt axillo-inguinal pathway, then Lt breast moving fluid towards pathways spending extra time in any areas of fibrosis then retracing all steps.  P/ROM: To left shoulder into flexion, abduction and D2 to pts available end motions with scapular depression by therapist throughout [place]: To Lt axilla during P/ROM over scar tissue  03/17/2022 In supine: Short neck, 5 diaphragmatic breaths, R axillary nodes and establishment of interaxillary pathway, L inguinal nodes and establishment  of axilloinguinal pathway, then L breast moving fluid towards pathways  spending extra time in any areas of fibrosis then retracing all steps. Had pt also practice all steps and modified her technique slightly. Stargazer x 5 with tightness noted on left STM left pectorals, Lats, UT PROM left shoulder flexion, scaption, abduction. Gave pt pics of post op exercises to assist ROM   03/12/22 In supine: Short neck, 5 diaphragmatic breaths, R axillary nodes and establishment of interaxillary pathway, L inguinal nodes and establishment of axilloinguinal pathway, then L breast moving fluid towards pathways spending extra time in any areas of fibrosis then retracing all steps. STM to L axilla in area of scar tissue    PATIENT EDUCATION:  Education details: Self MLD Person educated: Patient Education method: Explanation, Demonstration, Tactile cues, Verbal cues, and Handouts Education comprehension: verbalized understanding and needs further education  HOME EXERCISE PROGRAM: Self MLD  ASSESSMENT:  CLINICAL IMPRESSION: Progressed pt to include AA/ROM exercises which she tolerated well and reported feeling good stretches in axilla with these. Also continued with manual therapy working to decrease scar tissue and reduce lymphedema in breast with MLD. Pt is doing very well and is considering reducing freq to 1x/wk after next week.  OBJECTIVE IMPAIRMENTS: decreased knowledge of condition, increased edema, increased fascial restrictions, and impaired flexibility.   ACTIVITY LIMITATIONS: reach over head  PARTICIPATION LIMITATIONS: none  PERSONAL FACTORS: 1-2 comorbidities: SLNB, radiation hx  are also affecting patient's functional outcome.   REHAB POTENTIAL: Excellent  CLINICAL DECISION MAKING: Stable/uncomplicated  EVALUATION COMPLEXITY: Low  GOALS: Goals reviewed with patient? Yes   LONG TERM GOALS: Target date: 03/11/22  Pt will decrease breast complaints questionnaire to 15 or less to decrease risk of infection  Baseline:  Goal status: INITIAL  2.   Pt will be ind with self MLD and compression Baseline:  Goal status: INITIAL  3.  Pt will return to baseline AROM Baseline:  Goal status: INITIAL   PLAN:  PT FREQUENCY: 2x/week  PT DURATION: 6 weeks  PLANNED INTERVENTIONS: Therapeutic exercises, Patient/Family education, Self Care, DME instructions, Manual therapy, and Re-evaluation, taping  PLAN FOR NEXT SESSION: Cont (and review prn) Lt breast MLD, axillary decongestion, STM, AAROM; add supine scapular series?    Otelia Limes, PTA 03/19/2022, 1:06 PM

## 2022-03-24 ENCOUNTER — Ambulatory Visit: Payer: 59 | Attending: General Surgery | Admitting: Rehabilitation

## 2022-03-24 ENCOUNTER — Encounter: Payer: Self-pay | Admitting: Rehabilitation

## 2022-03-24 DIAGNOSIS — Z170 Estrogen receptor positive status [ER+]: Secondary | ICD-10-CM | POA: Insufficient documentation

## 2022-03-24 DIAGNOSIS — R293 Abnormal posture: Secondary | ICD-10-CM | POA: Insufficient documentation

## 2022-03-24 DIAGNOSIS — Z483 Aftercare following surgery for neoplasm: Secondary | ICD-10-CM | POA: Insufficient documentation

## 2022-03-24 DIAGNOSIS — C50412 Malignant neoplasm of upper-outer quadrant of left female breast: Secondary | ICD-10-CM | POA: Insufficient documentation

## 2022-03-24 DIAGNOSIS — I890 Lymphedema, not elsewhere classified: Secondary | ICD-10-CM | POA: Insufficient documentation

## 2022-03-24 NOTE — Therapy (Signed)
OUTPATIENT PHYSICAL THERAPY ONCOLOGY TREATMENT  Patient Name: Elizabeth Cummings MRN: [id number] DOB:[birth year], 47 y.o., female Today's Date: 03/24/2022  END OF SESSION:  PT End of Session - 03/24/22 1642     Visit Number 5    Number of Visits 9    Date for PT Re-Evaluation 04/22/22    PT Start Time 1503    PT Stop Time 0932    PT Time Calculation (min) 50 min    Activity Tolerance Patient tolerated treatment well    Behavior During Therapy [facility] for tasks assessed/performed              Past Medical History:  Diagnosis Date   Allergy    seasonal, dogs, cats   Angioneurotic edema 05/08/2014   Cancer ([place]) 07/24/2021   Breast   Diabetes mellitus without complication ([place])    Leukocytosis 05/08/2014   Microcytic anemia 05/08/2014   Thrombocytosis 05/08/2014   Past Surgical History:  Procedure Laterality Date   APPENDECTOMY     BREAST BIOPSY Left 07/24/2021   MRI   BREAST BIOPSY Left 06/27/2021   U/S   BREAST LUMPECTOMY WITH RADIOACTIVE SEED AND SENTINEL LYMPH NODE BIOPSY Left 07/28/2021   Procedure: LEFT BREAST BRACKETED LUMPECTOMY WITH RADIOACTIVE SEED AND AXILLARY SENTINEL LYMPH NODE BIOPSY;  Surgeon: Rolm Bookbinder, MD;  Location: [place];  Service: General;  Laterality: Left;   CESAREAN SECTION     CHOLECYSTECTOMY     WRIST SURGERY     Patient Active Problem List   Diagnosis Date Noted   Dermatographic urticaria 12/25/2021   Allergic rhinitis 12/25/2021   Genetic testing 07/25/2021   Malignant neoplasm of upper-outer quadrant of left breast in female, estrogen receptor positive ([place]) 07/10/2021   Migraine without status migrainosus, not intractable 04/02/2021   Hyperlipidemia associated with type 2 diabetes mellitus ([place]) 01/28/2021   Diabetes mellitus ([place]) 01/01/2021   Class 2 severe obesity with serious comorbidity and body mass index (BMI) of 36.0 to 36.9 in adult [facility]) 01/01/2021   Celiac disease 07/31/2019   Sensorineural hearing  loss (SNHL) of left ear with unrestricted hearing of right ear 04/07/2019   Microcytic anemia 05/08/2014   Leukocytosis 05/08/2014   Thrombocytosis 05/08/2014   Angioneurotic edema 05/08/2014    PCP: Dr Mayra Neer  REFERRING PROVIDER: Dr. Lindi Adie   REFERRING DIAG:  C50.412,Z17.0 (ICD-10-CM) - Malignant neoplasm of upper-outer quadrant of left breast in female, estrogen receptor positive ([place])  I89.0 (ICD-10-CM) - Lymphedema of breast    THERAPY DIAG:  Lymphedema, not elsewhere classified  Aftercare following surgery for neoplasm  Abnormal posture  Malignant neoplasm of upper-outer quadrant of left breast in female, estrogen receptor positive ([place])  ONSET DATE: 10/08/21  Rationale for Evaluation and Treatment: Rehabilitation  SUBJECTIVE:  SUBJECTIVE STATEMENT:  It has been feeling better.    PERTINENT HISTORY:  Patient was diagnosed on 06/27/2021 with left grade 1 Invasive Lobular Carcinoma. It measured 4 mm and is located in the upper outer quadrant. It is ER+, PR+, HER 2- with a Ki67 of 4%. Pt had a left bracketed lumpectomy with axillary SLNB on 07/28/2021. Completed radiation 10/08/21.  PAIN:  Are you having pain? No Pt just reports tightness in L axilla  PRECAUTIONS: Lt lymphedema risk, radiation hx  WEIGHT BEARING RESTRICTIONS: No  FALLS:  Has patient fallen in last 6 months? 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    OBJECTIVE:  COGNITION: Overall cognitive status: Within functional limits for tasks assessed   PALPATION: Moderate fibrosis medial inferior  breast, mild fibrosis anterior breast - increased scar tissue lumpectomy incision, axillary congestion, pectoralis tightness.   OBSERVATIONS / OTHER ASSESSMENTS: enlarged pores, firm skin medial breast  UPPER EXTREMITY AROM/PROM:  A/PROM RIGHT   eval   Shoulder extension   Shoulder flexion   Shoulder abduction   Shoulder internal rotation   Shoulder external rotation     (Blank rows = not tested)  A/PROM LEFT   eval  Shoulder extension 70  Shoulder flexion 165 - tight in the back of the shoulder  Shoulder abduction 170 - pull in axilla   Shoulder internal rotation   Shoulder external rotation 85 - tight top of shoulder - pn    (Blank rows = not tested)  BREAST COMPLAINTS QUESTIONNAIRE Pain:   0 Heaviness:   4 Swollen feeling: 4 Tense Skin: 4 Redness: 2 Bra Print: 3 Size of Pores: 3 Hard feeling:  4 Total:   24  /80 A Score over 9 indicates lymphedema issues in the breast   TODAY'S TREATMENT:                                                                                                                                         DATE:  03/24/22: Manual Therapy MLD: In supine: Short neck, superficial and deep abdominals, Rt axillary nodes, Rt intact upper quadrant sequence and then establishment of anterior inter-axillary pathway, Lt inguinal nodes and establishment of Lt axillo-inguinal pathway, then Lt breast moving fluid towards pathways spending extra time in any areas of fibrosis then retracing all steps.  P/ROM: To left shoulder into flexion, abduction and D2 to pts available end motions with scapular depression by therapist throughout [place]: To Lt axilla during P/ROM over scar tissue  03/19/22: Manual Therapy MLD: In supine: Short neck, superficial and deep abdominals, Rt axillary nodes, Rt intact upper quadrant sequence and then establishment of anterior inter-axillary pathway, Lt inguinal nodes and establishment of  Lt axillo-inguinal pathway, then Lt breast moving  fluid towards pathways spending extra time in any areas of fibrosis then retracing all steps.  P/ROM: To left shoulder into flexion, abduction and D2 to pts available end motions with scapular depression by therapist throughout [place]: To Lt axilla during P/ROM over scar tissue  03/17/2022 In supine: Short neck, 5 diaphragmatic breaths, R axillary nodes and establishment of interaxillary pathway, L inguinal nodes and establishment of axilloinguinal pathway, then L breast moving fluid towards pathways spending extra time in any areas of fibrosis then retracing all steps. Had pt also practice all steps and modified her technique slightly. Stargazer x 5 with tightness noted on left STM left pectorals, Lats, UT PROM left shoulder flexion, scaption, abduction. Gave pt pics of post op exercises to assist ROM  PATIENT EDUCATION:  Education details: Self MLD Person educated: Patient Education method: Explanation, Demonstration, Tactile cues, Verbal cues, and Handouts Education comprehension: verbalized understanding and needs further education  HOME EXERCISE PROGRAM: Self MLD  ASSESSMENT:  CLINICAL IMPRESSION: Continues with medial inferior breast fibrosis that softens with MT.  Axilla seems less congested compared to eval visit.  Will continue with POC decreasing to 1x per week and pt knows she can return to 2x per week as needed.   OBJECTIVE IMPAIRMENTS: decreased knowledge of condition, increased edema, increased fascial restrictions, and impaired flexibility.   ACTIVITY LIMITATIONS: reach over head  PARTICIPATION LIMITATIONS: none  PERSONAL FACTORS: 1-2 comorbidities: SLNB, radiation hx  are also affecting patient's functional outcome.   REHAB POTENTIAL: Excellent  CLINICAL DECISION MAKING: Stable/uncomplicated  EVALUATION COMPLEXITY: Low  GOALS: Goals reviewed with patient? Yes   LONG TERM GOALS: Target date: 03/11/22  Pt will decrease breast complaints questionnaire to 15 or  less to decrease risk of infection  Baseline:  Goal status: INITIAL  2.  Pt will be ind with self MLD and compression Baseline:  Goal status: INITIAL  3.  Pt will return to baseline AROM Baseline:  Goal status: INITIAL   PLAN:  PT FREQUENCY: 2x/week  PT DURATION: 6 weeks  PLANNED INTERVENTIONS: Therapeutic exercises, Patient/Family education, Self Care, DME instructions, Manual therapy, and Re-evaluation, taping  PLAN FOR NEXT SESSION: Cont (and review prn) Lt breast MLD, axillary decongestion, STM, AAROM; add supine scapular series?    Stark Bray, PT 03/24/2022, 4:43 PM

## 2022-03-26 ENCOUNTER — Ambulatory Visit: Payer: 59

## 2022-03-26 NOTE — Progress Notes (Addendum)
TeleHealth Visit:  This visit was completed with telemedicine (audio/video) technology. Elizabeth Cummings has verbally consented to this TeleHealth visit. The patient is located at home, the provider is located at home. The participants in this visit include the listed provider and patient. The visit was conducted today via MyChart video.  OBESITY Elizabeth Cummings is here to discuss her progress with her obesity treatment plan along with follow-up of her obesity related diagnoses.    Today's visit was # 18  Starting weight: 231 lbs Starting date: 11/14/20 Weight reported at last virtual office visit: 187 lbs on 03/02/22 Today's reported weight: 184.8 lbs. Total weight loss: 47 Weight change since last visit: -3  Today's vital per pt: T- 98 BP- 104/80 HR- 97 R- 18 O2sat- 97%   Nutrition Plan: practicing portion control and making smarter food choices, such as increasing vegetables and decreasing simple carbohydrates daily   Current exercise:  taking steps rather than elevator and walking 14 minutes/day. 10000-12000 steps/day on workdays.  Interim History: She is surprised that she lost weight over the holidays.  She is 10 pounds away from her goal weight.  Protein intake is low.  She averages 40 to 50 g/day.  Also needs to improve water intake.  Breakfast is usually protein oatmeal or protein shake.  Lunch is a salad or something else from the cafeteria at work.  Dinner varies.  She feels she is missing the protein at lunch.  Weight goal is 174 pounds-29 BMI.  Assessment/Plan:  1. Type II Diabetes HgbA1c is at goal. Last A1c was 6.1. Medication(s): Mounjaro 15 mg weekly.  Lab Results  Component Value Date   HGBA1C 6.1 (H) 12/23/2021   HGBA1C 6.3 (H) 06/12/2021   HGBA1C 6.6 03/14/2021   Lab Results  Component Value Date   LDLCALC 90 12/23/2021   CREATININE 0.77 12/23/2021    Plan: Continue Mounjaro 15 mg weekly.   2. Hyperlipidemia associated with type 2 diabetes LDL is not  at goal.  LDL slightly above goal at 90. Medication(s): Pravachol 20 mg 3 times per week.  Lab Results  Component Value Date   CHOL 160 12/23/2021   HDL 44 12/23/2021   LDLCALC 90 12/23/2021   TRIG 150 (H) 12/23/2021   Lab Results  Component Value Date   ALT 18 12/23/2021   AST 21 12/23/2021   ALKPHOS 88 12/23/2021   BILITOT 0.3 12/23/2021   The 10-year ASCVD risk score (Arnett DK, et al., 2019) is: 1.5%   Values used to calculate the score:     Age: 47 years     Sex: Female     Is Non-Hispanic African American: No     Diabetic: Yes     Tobacco smoker: No     Systolic Blood Pressure: 130 mmHg     Is BP treated: No     HDL Cholesterol: 44 mg/dL     Total Cholesterol: 160 mg/dL  Plan: Continue pravastatin at current dose. Check lipid panel in 1 to 2 months.  3. Obesity: Current BMI 31.6 Elizabeth Cummings is currently in the action stage of change. As such, her goal is to continue with weight loss efforts.  She has agreed to practicing portion control and making smarter food choices, such as increasing vegetables and decreasing simple carbohydrates.   1.  Discussed ways to get in more protein at lunch. 2.  Increase water intake. 3.  Add in arm weights a few times per week.  Behavioral modification strategies: increasing lean protein intake, increasing water  intake, and planning for success.  Elizabeth Cummings has agreed to follow-up with our clinic in 4 weeks.   No orders of the defined types were placed in this encounter.   Medications Discontinued During This Encounter  Medication Reason   cyanocobalamin (VITAMIN B12) 1000 MCG tablet Discontinued by provider     No orders of the defined types were placed in this encounter.     Objective:   VITALS: Per patient if applicable, see vitals. GENERAL: Alert and in no acute distress. CARDIOPULMONARY: No increased WOB. Speaking in clear sentences.  PSYCH: Pleasant and cooperative. Speech normal rate and rhythm. Affect is appropriate.  Insight and judgement are appropriate. Attention is focused, linear, and appropriate.  NEURO: Oriented as arrived to appointment on time with no prompting.   Lab Results  Component Value Date   CREATININE 0.77 12/23/2021   BUN 11 12/23/2021   NA 140 12/23/2021   K 3.5 12/23/2021   CL 97 12/23/2021   CO2 24 12/23/2021   Lab Results  Component Value Date   ALT 18 12/23/2021   AST 21 12/23/2021   ALKPHOS 88 12/23/2021   BILITOT 0.3 12/23/2021   Lab Results  Component Value Date   HGBA1C 6.1 (H) 12/23/2021   HGBA1C 6.3 (H) 06/12/2021   HGBA1C 6.6 03/14/2021   HGBA1C 6.4 (H) 11/14/2020   HGBA1C 6.4 12/27/2014   Lab Results  Component Value Date   INSULIN 20.3 11/14/2020   Lab Results  Component Value Date   TSH 0.785 11/14/2020   Lab Results  Component Value Date   CHOL 160 12/23/2021   HDL 44 12/23/2021   LDLCALC 90 12/23/2021   TRIG 150 (H) 12/23/2021   Lab Results  Component Value Date   WBC 7.7 12/23/2021   HGB 13.2 12/23/2021   HCT 41.0 12/23/2021   MCV 85 12/23/2021   PLT 372 12/23/2021   Lab Results  Component Value Date   IRON 58 05/08/2014   TIBC 506 (H) 05/08/2014   FERRITIN 36 05/08/2014   Lab Results  Component Value Date   VD25OH 41.1 12/23/2021   VD25OH 50.3 06/12/2021   VD25OH 50.9 11/14/2020    Attestation Statements:   Reviewed by clinician on day of visit: allergies, medications, problem list, medical history, surgical history, family history, social history, and previous encounter notes.  Time spent on visit including the items listed below was 30 minutes.  -preparing to see the patient (e.g., review of tests, history, previous notes) -obtaining and/or reviewing separately obtained history -counseling and educating the patient/family/caregiver -documenting clinical information in the electronic or other health record -ordering medications, tests, or procedures -independently interpreting results and communicating results to the  patient/ family/caregiver -referring and communicating with other health care professionals  -care coordination

## 2022-03-27 DIAGNOSIS — Z170 Estrogen receptor positive status [ER+]: Secondary | ICD-10-CM | POA: Diagnosis not present

## 2022-03-27 DIAGNOSIS — C50412 Malignant neoplasm of upper-outer quadrant of left female breast: Secondary | ICD-10-CM | POA: Diagnosis not present

## 2022-03-30 ENCOUNTER — Encounter (INDEPENDENT_AMBULATORY_CARE_PROVIDER_SITE_OTHER): Payer: Self-pay | Admitting: Family Medicine

## 2022-03-30 ENCOUNTER — Ambulatory Visit: Payer: 59 | Attending: Hematology and Oncology | Admitting: Physical Therapy

## 2022-03-30 ENCOUNTER — Encounter: Payer: Self-pay | Admitting: Physical Therapy

## 2022-03-30 ENCOUNTER — Telehealth (INDEPENDENT_AMBULATORY_CARE_PROVIDER_SITE_OTHER): Payer: 59 | Admitting: Family Medicine

## 2022-03-30 VITALS — Ht 64.0 in | Wt 184.0 lb

## 2022-03-30 DIAGNOSIS — E785 Hyperlipidemia, unspecified: Secondary | ICD-10-CM | POA: Diagnosis not present

## 2022-03-30 DIAGNOSIS — C50412 Malignant neoplasm of upper-outer quadrant of left female breast: Secondary | ICD-10-CM | POA: Insufficient documentation

## 2022-03-30 DIAGNOSIS — I890 Lymphedema, not elsewhere classified: Secondary | ICD-10-CM

## 2022-03-30 DIAGNOSIS — Z170 Estrogen receptor positive status [ER+]: Secondary | ICD-10-CM | POA: Insufficient documentation

## 2022-03-30 DIAGNOSIS — Z483 Aftercare following surgery for neoplasm: Secondary | ICD-10-CM | POA: Diagnosis not present

## 2022-03-30 DIAGNOSIS — E669 Obesity, unspecified: Secondary | ICD-10-CM

## 2022-03-30 DIAGNOSIS — R293 Abnormal posture: Secondary | ICD-10-CM | POA: Diagnosis not present

## 2022-03-30 DIAGNOSIS — E1169 Type 2 diabetes mellitus with other specified complication: Secondary | ICD-10-CM | POA: Diagnosis not present

## 2022-03-30 DIAGNOSIS — Z7985 Long-term (current) use of injectable non-insulin antidiabetic drugs: Secondary | ICD-10-CM | POA: Diagnosis not present

## 2022-03-30 DIAGNOSIS — Z6831 Body mass index (BMI) 31.0-31.9, adult: Secondary | ICD-10-CM | POA: Diagnosis not present

## 2022-03-30 NOTE — Therapy (Signed)
OUTPATIENT PHYSICAL THERAPY ONCOLOGY TREATMENT  Patient Name: Elizabeth Cummings MRN: [id number] DOB:07-28-1975, 47 y.o., female Today's Date: 03/30/2022  END OF SESSION:  PT End of Session - 03/30/22 1504     Visit Number 6    Number of Visits 9    Date for PT Re-Evaluation 04/22/22    PT Start Time 1503    PT Stop Time 9480    PT Time Calculation (min) 54 min    Activity Tolerance Patient tolerated treatment well    Behavior During Therapy [facility] for tasks assessed/performed              Past Medical History:  Diagnosis Date   Allergy    seasonal, dogs, cats   Angioneurotic edema 05/08/2014   Cancer ([place]) 07/24/2021   Breast   Diabetes mellitus without complication ([place])    Leukocytosis 05/08/2014   Microcytic anemia 05/08/2014   Thrombocytosis 05/08/2014   Past Surgical History:  Procedure Laterality Date   APPENDECTOMY     BREAST BIOPSY Left 07/24/2021   MRI   BREAST BIOPSY Left 06/27/2021   U/S   BREAST LUMPECTOMY WITH RADIOACTIVE SEED AND SENTINEL LYMPH NODE BIOPSY Left 07/28/2021   Procedure: LEFT BREAST BRACKETED LUMPECTOMY WITH RADIOACTIVE SEED AND AXILLARY SENTINEL LYMPH NODE BIOPSY;  Surgeon: Rolm Bookbinder, MD;  Location: [place];  Service: General;  Laterality: Left;   CESAREAN SECTION     CHOLECYSTECTOMY     WRIST SURGERY     Patient Active Problem List   Diagnosis Date Noted   Dermatographic urticaria 12/25/2021   Allergic rhinitis 12/25/2021   Genetic testing 07/25/2021   Malignant neoplasm of upper-outer quadrant of left breast in female, estrogen receptor positive ([place]) 07/10/2021   Migraine without status migrainosus, not intractable 04/02/2021   Hyperlipidemia associated with type 2 diabetes mellitus ([place]) 01/28/2021   Diabetes mellitus ([place]) 01/01/2021   Class 2 severe obesity with serious comorbidity and body mass index (BMI) of 36.0 to 36.9 in adult [facility]) 01/01/2021   Celiac disease 07/31/2019   Sensorineural hearing  loss (SNHL) of left ear with unrestricted hearing of right ear 04/07/2019   Microcytic anemia 05/08/2014   Leukocytosis 05/08/2014   Thrombocytosis 05/08/2014   Angioneurotic edema 05/08/2014    PCP: Dr Mayra Neer  REFERRING PROVIDER: Dr. Lindi Adie   REFERRING DIAG:  C50.412,Z17.0 (ICD-10-CM) - Malignant neoplasm of upper-outer quadrant of left breast in female, estrogen receptor positive ([place])  I89.0 (ICD-10-CM) - Lymphedema of breast    THERAPY DIAG:  Lymphedema, not elsewhere classified  Aftercare following surgery for neoplasm  Abnormal posture  Malignant neoplasm of upper-outer quadrant of left breast in female, estrogen receptor positive ([place])  ONSET DATE: 10/08/21  Rationale for Evaluation and Treatment: Rehabilitation  SUBJECTIVE:  SUBJECTIVE STATEMENT:  I still feel real tight under my arm. I have not been doing the exercises like I should.   PERTINENT HISTORY:  Patient was diagnosed on 06/27/2021 with left grade 1 Invasive Lobular Carcinoma. It measured 4 mm and is located in the upper outer quadrant. It is ER+, PR+, HER 2- with a Ki67 of 4%. Pt had a left bracketed lumpectomy with axillary SLNB on 07/28/2021. Completed radiation 10/08/21.  PAIN:  Are you having pain? No Pt just reports tightness in L axilla  PRECAUTIONS: Lt lymphedema risk, radiation hx  WEIGHT BEARING RESTRICTIONS: No  FALLS:  Has patient fallen in last 6 months? 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    OBJECTIVE:  COGNITION: Overall cognitive status: Within functional limits for  tasks assessed   PALPATION: Moderate fibrosis medial inferior breast, mild fibrosis anterior breast - increased scar tissue lumpectomy incision, axillary congestion, pectoralis tightness.   OBSERVATIONS / OTHER ASSESSMENTS: enlarged pores, firm skin medial breast  UPPER EXTREMITY AROM/PROM:  A/PROM RIGHT   eval   Shoulder extension   Shoulder flexion   Shoulder abduction   Shoulder internal rotation   Shoulder external rotation     (Blank rows = not tested)  A/PROM LEFT   eval  Shoulder extension 70  Shoulder flexion 165 - tight in the back of the shoulder  Shoulder abduction 170 - pull in axilla   Shoulder internal rotation   Shoulder external rotation 85 - tight top of shoulder - pn    (Blank rows = not tested)  BREAST COMPLAINTS QUESTIONNAIRE Pain:   0 Heaviness:   4 Swollen feeling: 4 Tense Skin: 4 Redness: 2 Bra Print: 3 Size of Pores: 3 Hard feeling:  4 Total:   24  /80 A Score over 9 indicates lymphedema issues in the breast   TODAY'S TREATMENT:                                                                                                                                         DATE:  03/30/22: Manual Therapy MLD: In supine: Short neck, superficial and deep abdominals, Rt axillary nodes, Rt intact upper quadrant sequence and then establishment of anterior inter-axillary pathway, Lt inguinal nodes and establishment of Lt axillo-inguinal pathway, then Lt breast moving fluid towards pathways spending extra time in any areas of fibrosis then retracing all steps.  P/ROM: To left shoulder into flexion, abduction and D2 to pts available end motions  MFR: To Lt axilla during P/ROM over scar tissue  03/24/22: Manual Therapy MLD: In supine: Short neck, superficial and deep abdominals, Rt axillary nodes, Rt intact upper quadrant sequence and then establishment of anterior inter-axillary  pathway, Lt inguinal nodes and establishment of Lt axillo-inguinal pathway, then Lt  breast moving fluid towards pathways spending extra time in any areas of fibrosis then retracing all steps.  P/ROM: To left shoulder into flexion, abduction and D2 to pts available end motions with scapular depression by therapist throughout [place]: To Lt axilla during P/ROM over scar tissue  03/19/22: Manual Therapy MLD: In supine: Short neck, superficial and deep abdominals, Rt axillary nodes, Rt intact upper quadrant sequence and then establishment of anterior inter-axillary pathway, Lt inguinal nodes and establishment of Lt axillo-inguinal pathway, then Lt breast moving fluid towards pathways spending extra time in any areas of fibrosis then retracing all steps.  P/ROM: To left shoulder into flexion, abduction and D2 to pts available end motions with scapular depression by therapist throughout [place]: To Lt axilla during P/ROM over scar tissue  03/17/2022 In supine: Short neck, 5 diaphragmatic breaths, R axillary nodes and establishment of interaxillary pathway, L inguinal nodes and establishment of axilloinguinal pathway, then L breast moving fluid towards pathways spending extra time in any areas of fibrosis then retracing all steps. Had pt also practice all steps and modified her technique slightly. Stargazer x 5 with tightness noted on left STM left pectorals, Lats, UT PROM left shoulder flexion, scaption, abduction. Gave pt pics of post op exercises to assist ROM  PATIENT EDUCATION:  Education details: Self MLD Person educated: Patient Education method: Consulting civil engineer, Demonstration, Tactile cues, Verbal cues, and Handouts Education comprehension: verbalized understanding and needs further education  HOME EXERCISE PROGRAM: Self MLD  ASSESSMENT:  CLINICAL IMPRESSION: Pt still had medial breast fibrosis but has been wearing the foam pad in her bra which has been softening it. She reports she has not been doing her exercises like she should and has tightness with L shoulder ROM in her  axilla. Continued with PROM and MFR to this area today. Will upgrade her HEP at next session for continued stretching at home. Encouraged pt on importance of doing her HEP daily and self MLD.   OBJECTIVE IMPAIRMENTS: decreased knowledge of condition, increased edema, increased fascial restrictions, and impaired flexibility.   ACTIVITY LIMITATIONS: reach over head  PARTICIPATION LIMITATIONS: none  PERSONAL FACTORS: 1-2 comorbidities: SLNB, radiation hx  are also affecting patient's functional outcome.   REHAB POTENTIAL: Excellent  CLINICAL DECISION MAKING: Stable/uncomplicated  EVALUATION COMPLEXITY: Low  GOALS: Goals reviewed with patient? Yes   LONG TERM GOALS: Target date: 03/11/22  Pt will decrease breast complaints questionnaire to 15 or less to decrease risk of infection  Baseline:  Goal status: INITIAL  2.  Pt will be ind with self MLD and compression Baseline:  Goal status: INITIAL  3.  Pt will return to baseline AROM Baseline:  Goal status: INITIAL   PLAN:  PT FREQUENCY: 2x/week  PT DURATION: 6 weeks  PLANNED INTERVENTIONS: Therapeutic exercises, Patient/Family education, Self Care, DME instructions, Manual therapy, and Re-evaluation, taping  PLAN FOR NEXT SESSION: Assess goals and give supine scap? Cont (and review prn) Lt breast MLD, axillary decongestion, STM, AAROM; add supine scapular series?    [facility] [place], PT 03/30/2022, 4:03 PM

## 2022-04-01 ENCOUNTER — Ambulatory Visit: Payer: 59 | Admitting: Physical Therapy

## 2022-04-01 ENCOUNTER — Encounter: Payer: Self-pay | Admitting: Physical Therapy

## 2022-04-01 DIAGNOSIS — Z483 Aftercare following surgery for neoplasm: Secondary | ICD-10-CM | POA: Diagnosis not present

## 2022-04-01 DIAGNOSIS — R293 Abnormal posture: Secondary | ICD-10-CM | POA: Diagnosis not present

## 2022-04-01 DIAGNOSIS — C50412 Malignant neoplasm of upper-outer quadrant of left female breast: Secondary | ICD-10-CM | POA: Diagnosis not present

## 2022-04-01 DIAGNOSIS — Z170 Estrogen receptor positive status [ER+]: Secondary | ICD-10-CM

## 2022-04-01 DIAGNOSIS — I890 Lymphedema, not elsewhere classified: Secondary | ICD-10-CM | POA: Diagnosis not present

## 2022-04-01 NOTE — Patient Instructions (Signed)
Over Head Pull: Narrow and Wide Grip   Cancer Rehab [phone]   On back, knees bent, feet flat, band across thighs, elbows straight but relaxed. Pull hands apart (start). Keeping elbows straight, bring arms up and over head, hands toward floor. Keep pull steady on band. Hold momentarily. Return slowly, keeping pull steady, back to start. Then do same with a wider grip on the band (past shoulder width) Repeat _10__ times. Band color __yellow____   Side Pull: Double Arm   On back, knees bent, feet flat. Arms perpendicular to body, shoulder level, elbows straight but relaxed. Pull arms out to sides, elbows straight. Resistance band comes across collarbones, hands toward floor. Hold momentarily. Slowly return to starting position. Repeat _10__ times. Band color _yellow____   Sword   On back, knees bent, feet flat, left hand on left hip, right hand above left. Pull right arm DIAGONALLY (hip to shoulder) across chest. Bring right arm along head toward floor. Hold momentarily. Thumb is pointed down when by hip and rotates backwards towards the floor when by head. Slowly return to starting position. Repeat _10__ times. Do with left arm. Band color _yellow_____   Shoulder Rotation: Double Arm   On back, knees bent, feet flat, elbows tucked at sides, bent 90, hands palms up. Pull hands apart and down toward floor, keeping elbows near sides. Hold momentarily. Slowly return to starting position. Repeat _10__ times. Band color __yellow____

## 2022-04-01 NOTE — Therapy (Signed)
OUTPATIENT PHYSICAL THERAPY ONCOLOGY TREATMENT  Patient Name: ROSALINE Cummings MRN: [id number] DOB:January 14, 1976, 47 y.o., female Today's Date: 04/01/2022  END OF SESSION:  PT End of Session - 04/01/22 1403     Visit Number 7    Number of Visits 9    Date for PT Re-Evaluation 04/22/22    PT Start Time 8250    PT Stop Time 1455    PT Time Calculation (min) 53 min    Activity Tolerance Patient tolerated treatment well    Behavior During Therapy [facility] for tasks assessed/performed              Past Medical History:  Diagnosis Date   Allergy    seasonal, dogs, cats   Angioneurotic edema 05/08/2014   Cancer ([place]) 07/24/2021   Breast   Diabetes mellitus without complication ([place])    Leukocytosis 05/08/2014   Microcytic anemia 05/08/2014   Thrombocytosis 05/08/2014   Past Surgical History:  Procedure Laterality Date   APPENDECTOMY     BREAST BIOPSY Left 07/24/2021   MRI   BREAST BIOPSY Left 06/27/2021   U/S   BREAST LUMPECTOMY WITH RADIOACTIVE SEED AND SENTINEL LYMPH NODE BIOPSY Left 07/28/2021   Procedure: LEFT BREAST BRACKETED LUMPECTOMY WITH RADIOACTIVE SEED AND AXILLARY SENTINEL LYMPH NODE BIOPSY;  Surgeon: Rolm Bookbinder, MD;  Location: [place];  Service: General;  Laterality: Left;   CESAREAN SECTION     CHOLECYSTECTOMY     WRIST SURGERY     Patient Active Problem List   Diagnosis Date Noted   Dermatographic urticaria 12/25/2021   Allergic rhinitis 12/25/2021   Genetic testing 07/25/2021   Malignant neoplasm of upper-outer quadrant of left breast in female, estrogen receptor positive ([place]) 07/10/2021   Migraine without status migrainosus, not intractable 04/02/2021   Hyperlipidemia associated with type 2 diabetes mellitus ([place]) 01/28/2021   Diabetes mellitus ([place]) 01/01/2021   Class 2 severe obesity with serious comorbidity and body mass index (BMI) of 36.0 to 36.9 in adult [facility]) 01/01/2021   Celiac disease 07/31/2019   Sensorineural hearing  loss (SNHL) of left ear with unrestricted hearing of right ear 04/07/2019   Microcytic anemia 05/08/2014   Leukocytosis 05/08/2014   Thrombocytosis 05/08/2014   Angioneurotic edema 05/08/2014    PCP: Dr Mayra Neer  REFERRING PROVIDER: Dr. Lindi Adie   REFERRING DIAG:  C50.412,Z17.0 (ICD-10-CM) - Malignant neoplasm of upper-outer quadrant of left breast in female, estrogen receptor positive ([place])  I89.0 (ICD-10-CM) - Lymphedema of breast    THERAPY DIAG:  Lymphedema, not elsewhere classified  Aftercare following surgery for neoplasm  Abnormal posture  Malignant neoplasm of upper-outer quadrant of left breast in female, estrogen receptor positive ([place])  ONSET DATE: 10/08/21  Rationale for Evaluation and Treatment: Rehabilitation  SUBJECTIVE:  SUBJECTIVE STATEMENT:  I have been doing my exercises. I have not felt the heaviness in my breast like I did in the beginning.   PERTINENT HISTORY:  Patient was diagnosed on 06/27/2021 with left grade 1 Invasive Lobular Carcinoma. It measured 4 mm and is located in the upper outer quadrant. It is ER+, PR+, HER 2- with a Ki67 of 4%. Pt had a left bracketed lumpectomy with axillary SLNB on 07/28/2021. Completed radiation 10/08/21.  PAIN:  Are you having pain? No Very minimal tightness in L axilla  PRECAUTIONS: Lt lymphedema risk, radiation hx  WEIGHT BEARING RESTRICTIONS: No  FALLS:  Has patient fallen in last 6 months? 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    OBJECTIVE:  COGNITION: Overall cognitive status: Within functional  limits for tasks assessed   PALPATION: Moderate fibrosis medial inferior breast, mild fibrosis anterior breast - increased scar tissue lumpectomy incision, axillary congestion, pectoralis tightness.   OBSERVATIONS / OTHER ASSESSMENTS: enlarged pores, firm skin medial breast  UPPER EXTREMITY AROM/PROM:  A/PROM RIGHT   eval   Shoulder extension   Shoulder flexion   Shoulder abduction   Shoulder internal rotation   Shoulder external rotation     (Blank rows = not tested)  A/PROM LEFT   eval LEFT 04/01/22  Shoulder extension 70   Shoulder flexion 165 - tight in the back of the shoulder 170  Shoulder abduction 170 - pull in axilla  170  Shoulder internal rotation    Shoulder external rotation 85 - tight top of shoulder - pn     (Blank rows = not tested)  BREAST COMPLAINTS QUESTIONNAIRE Pain:   0 Heaviness:   4 Swollen feeling: 4 Tense Skin: 4 Redness: 2 Bra Print: 3 Size of Pores: 3 Hard feeling:  4 Total:   24  /80 A Score over 9 indicates lymphedema issues in the breast   TODAY'S TREATMENT:                                                                                                                                         DATE:  04/01/22: Therapeutic Exercise: Instructed pt in supine scapular strengthening series as follows with pt returning therapist demo: narrow and wide grip flexion, horizontal abduction, diagonals, and ER x 10 reps in each direction with yellow theraband Manual Therapy MLD: In supine: Short neck, superficial and deep abdominals, Rt axillary nodes, Rt intact upper quadrant sequence and then establishment of anterior inter-axillary pathway, Lt inguinal nodes and establishment of Lt axillo-inguinal pathway, then Lt breast moving fluid towards pathways spending extra time in any areas of fibrosis then retracing all steps.  MFR: To Lt axilla with LUE in  D2 03/30/22: Manual Therapy MLD: In supine: Short neck, superficial and deep abdominals, Rt axillary  nodes, Rt intact upper quadrant sequence and then establishment of anterior inter-axillary pathway, Lt inguinal nodes and establishment of Lt axillo-inguinal pathway, then Lt breast moving fluid towards pathways spending extra time in any areas of fibrosis then retracing all steps.  P/ROM: To left shoulder into flexion, abduction and D2 to pts available end motions  MFR: To Lt axilla during P/ROM over scar tissue  03/24/22: Manual Therapy MLD: In supine: Short neck, superficial and deep abdominals, Rt axillary nodes, Rt intact upper quadrant sequence and then establishment of anterior inter-axillary pathway, Lt inguinal nodes and establishment of Lt axillo-inguinal pathway, then Lt breast moving fluid towards pathways spending extra time in any areas of fibrosis then retracing all steps.  P/ROM: To left shoulder into flexion, abduction and D2 to pts available end motions with scapular depression by therapist throughout [place]: To Lt axilla during P/ROM over scar tissue  03/19/22: Manual Therapy MLD: In supine: Short neck, superficial and deep abdominals, Rt axillary nodes, Rt intact upper quadrant sequence and then establishment of anterior inter-axillary pathway, Lt inguinal nodes and establishment of Lt axillo-inguinal pathway, then Lt breast moving fluid towards pathways spending extra time in any areas of fibrosis then retracing all steps.  P/ROM: To left shoulder into flexion, abduction and D2 to pts available end motions with scapular depression by therapist throughout [place]: To Lt axilla during P/ROM over scar tissue  03/17/2022 In supine: Short neck, 5 diaphragmatic breaths, R axillary nodes and establishment of interaxillary pathway, L inguinal nodes and establishment of axilloinguinal pathway, then L breast moving fluid towards pathways spending extra time in any areas of fibrosis then retracing all steps. Had pt also practice all steps and modified her technique slightly. Stargazer x 5 with  tightness noted on left STM left pectorals, Lats, UT PROM left shoulder flexion, scaption, abduction. Gave pt pics of post op exercises to assist ROM  PATIENT EDUCATION:  Education details: Self MLD Person educated: Patient Education method: Explanation, Demonstration, Tactile cues, Verbal cues, and Handouts Education comprehension: verbalized understanding and needs further education  HOME EXERCISE PROGRAM: Self MLD  ASSESSMENT:  CLINICAL IMPRESSION: Fibrosis is softening in pt's medial breast. She has been compliant with her exercises since last visit. Her ROM has returned to baseline and she is now only having minimal tightness in left axilla at end range. Instructed pt in supine scapular strengthening exercises and added these to her HEP today.   OBJECTIVE IMPAIRMENTS: decreased knowledge of condition, increased edema, increased fascial restrictions, and impaired flexibility.   ACTIVITY LIMITATIONS: reach over head  PARTICIPATION LIMITATIONS: none  PERSONAL FACTORS: 1-2 comorbidities: SLNB, radiation hx  are also affecting patient's functional outcome.   REHAB POTENTIAL: Excellent  CLINICAL DECISION MAKING: Stable/uncomplicated  EVALUATION COMPLEXITY: Low  GOALS: Goals reviewed with patient? Yes   LONG TERM GOALS: Target date: 03/11/22  Pt will decrease breast complaints questionnaire to 15 or less to decrease risk of infection  Baseline:  Goal status: INITIAL  2.  Pt will be ind with self MLD and compression Baseline:  Goal status: INITIAL  3.  Pt will return to baseline AROM Baseline:  Goal status: INITIAL   PLAN:  PT FREQUENCY: 2x/week  PT DURATION: 6 weeks  PLANNED INTERVENTIONS: Therapeutic exercises, Patient/Family education, Self Care, DME instructions, Manual therapy, and Re-evaluation, taping  PLAN FOR NEXT SESSION: Assess goals, how were supine scap? Cont (and review prn) Lt breast MLD,  axillary decongestion, STM,   Elizabeth Cummings [place],  PT 04/01/2022, 3:03 PM

## 2022-04-02 ENCOUNTER — Other Ambulatory Visit (HOSPITAL_COMMUNITY): Payer: Self-pay

## 2022-04-03 DIAGNOSIS — H9042 Sensorineural hearing loss, unilateral, left ear, with unrestricted hearing on the contralateral side: Secondary | ICD-10-CM | POA: Diagnosis not present

## 2022-04-03 LAB — SIGNATERA
SIGNATERA MTM READOUT: 0 MTM/ml
SIGNATERA TEST RESULT: NEGATIVE

## 2022-04-07 ENCOUNTER — Ambulatory Visit: Payer: 59

## 2022-04-07 DIAGNOSIS — I890 Lymphedema, not elsewhere classified: Secondary | ICD-10-CM

## 2022-04-07 DIAGNOSIS — C50412 Malignant neoplasm of upper-outer quadrant of left female breast: Secondary | ICD-10-CM | POA: Diagnosis not present

## 2022-04-07 DIAGNOSIS — Z483 Aftercare following surgery for neoplasm: Secondary | ICD-10-CM | POA: Diagnosis not present

## 2022-04-07 DIAGNOSIS — R293 Abnormal posture: Secondary | ICD-10-CM

## 2022-04-07 DIAGNOSIS — Z170 Estrogen receptor positive status [ER+]: Secondary | ICD-10-CM | POA: Diagnosis not present

## 2022-04-07 NOTE — Therapy (Signed)
OUTPATIENT PHYSICAL THERAPY ONCOLOGY TREATMENT  Patient Name: Elizabeth Cummings MRN: [id number] DOB:1975/04/30, 47 y.o., female Today's Date: 04/07/2022  END OF SESSION:  PT End of Session - 04/07/22 1600     Visit Number 8    Number of Visits 9    Date for PT Re-Evaluation 04/22/22    PT Start Time 1602    PT Stop Time 1652    PT Time Calculation (min) 50 min    Activity Tolerance Patient tolerated treatment well    Behavior During Therapy [facility] for tasks assessed/performed              Past Medical History:  Diagnosis Date   Allergy    seasonal, dogs, cats   Angioneurotic edema 05/08/2014   Cancer ([place]) 07/24/2021   Breast   Diabetes mellitus without complication ([place])    Leukocytosis 05/08/2014   Microcytic anemia 05/08/2014   Thrombocytosis 05/08/2014   Past Surgical History:  Procedure Laterality Date   APPENDECTOMY     BREAST BIOPSY Left 07/24/2021   MRI   BREAST BIOPSY Left 06/27/2021   U/S   BREAST LUMPECTOMY WITH RADIOACTIVE SEED AND SENTINEL LYMPH NODE BIOPSY Left 07/28/2021   Procedure: LEFT BREAST BRACKETED LUMPECTOMY WITH RADIOACTIVE SEED AND AXILLARY SENTINEL LYMPH NODE BIOPSY;  Surgeon: Rolm Bookbinder, MD;  Location: [place];  Service: General;  Laterality: Left;   CESAREAN SECTION     CHOLECYSTECTOMY     WRIST SURGERY     Patient Active Problem List   Diagnosis Date Noted   Dermatographic urticaria 12/25/2021   Allergic rhinitis 12/25/2021   Genetic testing 07/25/2021   Malignant neoplasm of upper-outer quadrant of left breast in female, estrogen receptor positive ([place]) 07/10/2021   Migraine without status migrainosus, not intractable 04/02/2021   Hyperlipidemia associated with type 2 diabetes mellitus ([place]) 01/28/2021   Diabetes mellitus ([place]) 01/01/2021   Class 2 severe obesity with serious comorbidity and body mass index (BMI) of 36.0 to 36.9 in adult [facility]) 01/01/2021   Celiac disease 07/31/2019   Sensorineural hearing  loss (SNHL) of left ear with unrestricted hearing of right ear 04/07/2019   Microcytic anemia 05/08/2014   Leukocytosis 05/08/2014   Thrombocytosis 05/08/2014   Angioneurotic edema 05/08/2014    PCP: Dr Mayra Neer  REFERRING PROVIDER: Dr. Lindi Adie   REFERRING DIAG:  C50.412,Z17.0 (ICD-10-CM) - Malignant neoplasm of upper-outer quadrant of left breast in female, estrogen receptor positive ([place])  I89.0 (ICD-10-CM) - Lymphedema of breast    THERAPY DIAG:  Lymphedema, not elsewhere classified  Aftercare following surgery for neoplasm  Abnormal posture  Malignant neoplasm of upper-outer quadrant of left breast in female, estrogen receptor positive ([place])  ONSET DATE: 10/08/21  Rationale for Evaluation and Treatment: Rehabilitation  SUBJECTIVE:  SUBJECTIVE STATEMENT:  The heaviness is about gone. I do still have the  enlarged pores that move based on where I put the pad.  If I had a bigger pad it might work better.  PERTINENT HISTORY:  Patient was diagnosed on 06/27/2021 with left grade 1 Invasive Lobular Carcinoma. It measured 4 mm and is located in the upper outer quadrant. It is ER+, PR+, HER 2- with a Ki67 of 4%. Pt had a left bracketed lumpectomy with axillary SLNB on 07/28/2021. Completed radiation 10/08/21.  PAIN:  Are you having pain? No Very minimal tightness in L axilla  PRECAUTIONS: Lt lymphedema risk, radiation hx  WEIGHT BEARING RESTRICTIONS: No  FALLS:  Has patient fallen in last 6 months? 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     OBJECTIVE:  COGNITION: Overall cognitive status: Within functional limits for tasks assessed   PALPATION: Moderate fibrosis medial inferior breast, mild fibrosis anterior breast - increased scar tissue lumpectomy incision, axillary congestion, pectoralis tightness.   OBSERVATIONS / OTHER ASSESSMENTS: enlarged pores, firm skin medial breast  UPPER EXTREMITY AROM/PROM:  A/PROM RIGHT   eval   Shoulder extension   Shoulder flexion   Shoulder abduction   Shoulder internal rotation   Shoulder external rotation     (Blank rows = not tested)  A/PROM LEFT   eval LEFT 04/01/22  Shoulder extension 70   Shoulder flexion 165 - tight in the back of the shoulder 170  Shoulder abduction 170 - pull in axilla  170  Shoulder internal rotation    Shoulder external rotation 85 - tight top of shoulder - pn     (Blank rows = not tested)  BREAST COMPLAINTS QUESTIONNAIRE Pain:   0 Heaviness:   4 Swollen feeling: 4 Tense Skin: 4 Redness: 2 Bra Print: 3 Size of Pores: 3 Hard feeling:  4 Total:   24  /80 A Score over 9 indicates lymphedema issues in the breast   TODAY'S TREATMENT:                                                                                                                                         DATE:   04/07/2022 Pt performed MLD: In supine: Short neck, superficial and deep abdominals, Rt axillary nodes, Rt intact upper quadrant sequence and then establishment of anterior inter-axillary pathway, Lt inguinal nodes and establishment of Lt axillo-inguinal pathway, then Lt breast moving fluid towards pathways spending extra time in any areas of fibrosis then retracing all steps. VC's prn for proper sequence and technique Reviewed supine scapular series each exercise x 10 with yellow band; occasional visual cues and tactile cues for form. Large peach dot and white  foam pad in stockinette made for pts bra because smaller pad is just pushing swelling from 1 place to another.  Larger pad made to try and cover entire problem area.  04/01/22: Therapeutic Exercise: Instructed pt in supine scapular strengthening series as follows with pt returning therapist demo: narrow and wide grip flexion, horizontal abduction, diagonals, and ER x 10 reps in each direction with yellow theraband Manual Therapy MLD: In supine: Short neck, superficial and deep abdominals, Rt axillary nodes, Rt intact upper quadrant sequence and then establishment of anterior inter-axillary pathway, Lt inguinal nodes and establishment of Lt axillo-inguinal pathway, then Lt breast moving fluid towards pathways spending extra time in any areas of fibrosis then retracing all steps.  MFR: To Lt axilla with LUE in D2 03/30/22: Manual Therapy MLD: In supine: Short neck, superficial and deep abdominals, Rt axillary nodes, Rt intact upper quadrant sequence and then establishment of anterior inter-axillary pathway, Lt inguinal nodes and establishment of Lt axillo-inguinal pathway, then Lt breast moving fluid towards pathways spending extra time in any areas of fibrosis then retracing all steps.  P/ROM: To left shoulder into flexion, abduction and D2 to pts available end motions  MFR: To Lt axilla during P/ROM over scar tissue  03/24/22: Manual Therapy MLD: In supine: Short neck, superficial and deep abdominals, Rt axillary nodes, Rt intact upper quadrant sequence and then establishment of anterior inter-axillary pathway, Lt inguinal nodes and establishment of Lt axillo-inguinal pathway, then Lt breast moving fluid towards pathways spending extra time in any areas of fibrosis then retracing all steps.  P/ROM: To left shoulder into flexion, abduction and D2 to pts available end motions with scapular depression by therapist throughout [place]: To Lt axilla during P/ROM over scar tissue  03/19/22: Manual Therapy MLD: In supine: Short neck, superficial and deep abdominals, Rt axillary nodes, Rt intact upper quadrant sequence  and then establishment of anterior inter-axillary pathway, Lt inguinal nodes and establishment of Lt axillo-inguinal pathway, then Lt breast moving fluid towards pathways spending extra time in any areas of fibrosis then retracing all steps.  P/ROM: To left shoulder into flexion, abduction and D2 to pts available end motions with scapular depression by therapist throughout [place]: To Lt axilla during P/ROM over scar tissue  03/17/2022 In supine: Short neck, 5 diaphragmatic breaths, R axillary nodes and establishment of interaxillary pathway, L inguinal nodes and establishment of axilloinguinal pathway, then L breast moving fluid towards pathways spending extra time in any areas of fibrosis then retracing all steps. Had pt also practice all steps and modified her technique slightly. [place] x 5 with tightness noted on left STM left pectorals, Lats, UT PROM left shoulder flexion, scaption, abduction. Gave pt pics of post op exercises to assist ROM  PATIENT EDUCATION:  Education details: Self MLD Person educated: Patient Education method: Explanation, Demonstration, Tactile cues, Verbal cues, and Handouts Education comprehension: verbalized understanding and needs further education  HOME EXERCISE PROGRAM: Self MLD  ASSESSMENT:  CLINICAL IMPRESSION: Had pt return demonstrate self MLD and pt required occasional VC's and TC's for proper technique and sequence but did very well overall.  Reviewed supine scapular exercises and occasional VC's were needed as well for form. Pt is doing very well overall. She will push her second appt this week to next week and may be ready for DC.  OBJECTIVE IMPAIRMENTS: decreased knowledge of condition, increased edema, increased fascial restrictions, and impaired flexibility.   ACTIVITY LIMITATIONS: reach over head  PARTICIPATION LIMITATIONS: none  PERSONAL FACTORS: 1-2 comorbidities: SLNB,  radiation hx  are also affecting patient's functional outcome.    REHAB POTENTIAL: Excellent  CLINICAL DECISION MAKING: Stable/uncomplicated  EVALUATION COMPLEXITY: Low  GOALS: Goals reviewed with patient? Yes   LONG TERM GOALS: Target date: 03/11/22  Pt will decrease breast complaints questionnaire to 15 or less to decrease risk of infection  Baseline:  Goal status: INITIAL  2.  Pt will be ind with self MLD and compression Baseline:  Goal status: INITIAL  3.  Pt will return to baseline AROM Baseline:  Goal status: MET  PLAN:  PT FREQUENCY: 2x/week  PT DURATION: 6 weeks  PLANNED INTERVENTIONS: Therapeutic exercises, Patient/Family education, Self Care, DME instructions, Manual therapy, and Re-evaluation, taping  PLAN FOR NEXT SESSION: complete Breast Complaints survery. Assess goals, How was larger foam pad? RECERT/DC how were supine scap? Cont (and review prn) Lt breast MLD, axillary decongestion, STM,   Claris Pong, PT 04/07/2022, 5:00 PM

## 2022-04-08 DIAGNOSIS — M9903 Segmental and somatic dysfunction of lumbar region: Secondary | ICD-10-CM | POA: Diagnosis not present

## 2022-04-08 DIAGNOSIS — M62830 Muscle spasm of back: Secondary | ICD-10-CM | POA: Diagnosis not present

## 2022-04-09 ENCOUNTER — Encounter: Payer: Self-pay | Admitting: Physical Therapy

## 2022-04-14 ENCOUNTER — Ambulatory Visit: Payer: 59

## 2022-04-14 DIAGNOSIS — Z483 Aftercare following surgery for neoplasm: Secondary | ICD-10-CM

## 2022-04-14 DIAGNOSIS — R293 Abnormal posture: Secondary | ICD-10-CM | POA: Diagnosis not present

## 2022-04-14 DIAGNOSIS — I890 Lymphedema, not elsewhere classified: Secondary | ICD-10-CM | POA: Diagnosis not present

## 2022-04-14 DIAGNOSIS — C50412 Malignant neoplasm of upper-outer quadrant of left female breast: Secondary | ICD-10-CM | POA: Diagnosis not present

## 2022-04-14 DIAGNOSIS — Z170 Estrogen receptor positive status [ER+]: Secondary | ICD-10-CM | POA: Diagnosis not present

## 2022-04-14 NOTE — Therapy (Addendum)
OUTPATIENT PHYSICAL THERAPY ONCOLOGY TREATMENT  Patient Name: Elizabeth Cummings MRN: [id number] DOB:07/14/1975, 47 y.o., female Today's Date: 04/14/2022  END OF SESSION:  PT End of Session - 04/14/22 1314     Visit Number 9    Number of Visits 9    Date for PT Re-Evaluation 04/22/22    PT Start Time 1304    PT Stop Time 1401    PT Time Calculation (min) 57 min    Activity Tolerance Patient tolerated treatment well    Behavior During Therapy [facility] for tasks assessed/performed              Past Medical History:  Diagnosis Date   Allergy    seasonal, dogs, cats   Angioneurotic edema 05/08/2014   Cancer ([place]) 07/24/2021   Breast   Diabetes mellitus without complication ([place])    Leukocytosis 05/08/2014   Microcytic anemia 05/08/2014   Thrombocytosis 05/08/2014   Past Surgical History:  Procedure Laterality Date   APPENDECTOMY     BREAST BIOPSY Left 07/24/2021   MRI   BREAST BIOPSY Left 06/27/2021   U/S   BREAST LUMPECTOMY WITH RADIOACTIVE SEED AND SENTINEL LYMPH NODE BIOPSY Left 07/28/2021   Procedure: LEFT BREAST BRACKETED LUMPECTOMY WITH RADIOACTIVE SEED AND AXILLARY SENTINEL LYMPH NODE BIOPSY;  Surgeon: Rolm Bookbinder, MD;  Location: [place];  Service: General;  Laterality: Left;   CESAREAN SECTION     CHOLECYSTECTOMY     WRIST SURGERY     Patient Active Problem List   Diagnosis Date Noted   Dermatographic urticaria 12/25/2021   Allergic rhinitis 12/25/2021   Genetic testing 07/25/2021   Malignant neoplasm of upper-outer quadrant of left breast in female, estrogen receptor positive ([place]) 07/10/2021   Migraine without status migrainosus, not intractable 04/02/2021   Hyperlipidemia associated with type 2 diabetes mellitus ([place]) 01/28/2021   Diabetes mellitus ([place]) 01/01/2021   Class 2 severe obesity with serious comorbidity and body mass index (BMI) of 36.0 to 36.9 in adult [facility]) 01/01/2021   Celiac disease 07/31/2019   Sensorineural hearing  loss (SNHL) of left ear with unrestricted hearing of right ear 04/07/2019   Microcytic anemia 05/08/2014   Leukocytosis 05/08/2014   Thrombocytosis 05/08/2014   Angioneurotic edema 05/08/2014    PCP: Dr Mayra Neer  REFERRING PROVIDER: Dr. Lindi Adie   REFERRING DIAG:  C50.412,Z17.0 (ICD-10-CM) - Malignant neoplasm of upper-outer quadrant of left breast in female, estrogen receptor positive ([place])  I89.0 (ICD-10-CM) - Lymphedema of breast    THERAPY DIAG:  Lymphedema, not elsewhere classified  Aftercare following surgery for neoplasm  Abnormal posture  Malignant neoplasm of upper-outer quadrant of left breast in female, estrogen receptor positive ([place])  ONSET DATE: 10/08/21  Rationale for Evaluation and Treatment: Rehabilitation  SUBJECTIVE:  SUBJECTIVE STATEMENT:  I am doing well and my breast feels so much better. It's much softer and not heavy like it used to be. I think I'm ready for D/C.   PERTINENT HISTORY:  Patient was diagnosed on 06/27/2021 with left grade 1 Invasive Lobular Carcinoma. It measured 4 mm and is located in the upper outer quadrant. It is ER+, PR+, HER 2- with a Ki67 of 4%. Pt had a left bracketed lumpectomy with axillary SLNB on 07/28/2021. Completed radiation 10/08/21.  PAIN:  Are you having pain? No Very minimal tightness in L axilla  PRECAUTIONS: Lt lymphedema risk, radiation hx  WEIGHT BEARING RESTRICTIONS: No  FALLS:  Has patient fallen in last 6 months? 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    OBJECTIVE:  COGNITION: Overall cognitive  status: Within functional limits for tasks assessed   PALPATION: Moderate fibrosis medial inferior breast, mild fibrosis anterior breast - increased scar tissue lumpectomy incision, axillary congestion, pectoralis tightness.   OBSERVATIONS / OTHER ASSESSMENTS: enlarged pores, firm skin medial breast  UPPER EXTREMITY AROM/PROM:  A/PROM RIGHT   eval   Shoulder extension   Shoulder flexion   Shoulder abduction   Shoulder internal rotation   Shoulder external rotation     (Blank rows = not tested)  A/PROM LEFT   eval LEFT 04/01/22  Shoulder extension 70   Shoulder flexion 165 - tight in the back of the shoulder 170  Shoulder abduction 170 - pull in axilla  170  Shoulder internal rotation    Shoulder external rotation 85 - tight top of shoulder - pn     (Blank rows = not tested)  BREAST COMPLAINTS QUESTIONNAIRE Pain:   0 Heaviness:   0 Swollen feeling: 0 Tense Skin: 0 Redness: 0 Bra Print: 1 Size of Pores: 0 Hard feeling:  1 Total:   2  /80 A Score over 9 indicates lymphedema issues in the breast   TODAY'S TREATMENT:                                                                                                                                         DATE:  04/14/22: Self Care Showed pt Solaris and Jovi pak breast swell spots and she decided on wanting to try a Solaris swell spot. Pt was issued handout with this info and to try calling [facility] first to see if her insurance will cover this. If not she knows how to order from website [email or web address]  Manual Therapy MLD: In supine: Short neck, superficial and deep abdominals, Rt axillary nodes, Rt intact upper quadrant sequence and then establishment of anterior inter-axillary pathway, Lt inguinal nodes and establishment of Lt axillo-inguinal  pathway, then Lt medial breast moving fluid towards anterior pathway spending extra time in any areas of fibrosis, then into Rt S/L for focus on lateral breast fullness  redirecting towards lateral pathway, then finished retracing all steps in supine. Pt redid Breast Complaint survey, see above.   04/07/2022 Pt performed MLD: In supine: Short neck, superficial and deep abdominals, Rt axillary nodes, Rt intact upper quadrant sequence and then establishment of anterior inter-axillary pathway, Lt inguinal nodes and establishment of Lt axillo-inguinal pathway, then Lt breast moving fluid towards pathways spending extra time in any areas of fibrosis then retracing all steps. VC's prn for proper sequence and technique Reviewed supine scapular series each exercise x 10 with yellow band; occasional visual cues and tactile cues for form. Large peach dot and white foam pad in stockinette made for pts bra because smaller pad is just pushing swelling from 1 place to another. Larger pad made to try and cover entire problem area.  04/01/22: Therapeutic Exercise: Instructed pt in supine scapular strengthening series as follows with pt returning therapist demo: narrow and wide grip flexion, horizontal abduction, diagonals, and ER x 10 reps in each direction with yellow theraband Manual Therapy MLD: In supine: Short neck, superficial and deep abdominals, Rt axillary nodes, Rt intact upper quadrant sequence and then establishment of anterior inter-axillary pathway, Lt inguinal nodes and establishment of Lt axillo-inguinal pathway, then Lt breast moving fluid towards pathways spending extra time in any areas of fibrosis then retracing all steps.  MFR: To Lt axilla with LUE in D2    PATIENT EDUCATION:  Education details: Self MLD Person educated: Patient Education method: Explanation, Demonstration, Tactile cues, Verbal cues, and Handouts Education comprehension: verbalized understanding and needs further education  HOME EXERCISE PROGRAM: Self MLD  ASSESSMENT:  CLINICAL IMPRESSION: Pt returns for final visit. She reports new foam is helping but she still notices fluid being  pushed around breast so showed her swell spot options. See above. Then continued with MLD to Lt breast and showed pt how to perform in Rt S/L to reach Lt lateral breast easier. She was able to return good demo here. Pt has met all goals and made excellent progress and is ready for D/C per POC at this time.   OBJECTIVE IMPAIRMENTS: decreased knowledge of condition, increased edema, increased fascial restrictions, and impaired flexibility.   ACTIVITY LIMITATIONS: reach over head  PARTICIPATION LIMITATIONS: none  PERSONAL FACTORS: 1-2 comorbidities: SLNB, radiation hx  are also affecting patient's functional outcome.   REHAB POTENTIAL: Excellent  CLINICAL DECISION MAKING: Stable/uncomplicated  EVALUATION COMPLEXITY: Low  GOALS: Goals reviewed with patient? Yes   LONG TERM GOALS: Target date: 03/11/22  Pt will decrease breast complaints questionnaire to 15 or less to decrease risk of infection  Baseline: 2/80 - 04/14/22 Goal status: MET  2.  Pt will be ind with self MLD and compression Baseline: She is independent with both - 04/14/22 Goal status: MET  3.  Pt will return to baseline AROM Baseline:  Goal status: MET  PLAN:  PT FREQUENCY: 2x/week  PT DURATION: 6 weeks  PLANNED INTERVENTIONS: Therapeutic exercises, Patient/Family education, Self Care, DME instructions, Manual therapy, and Re-evaluation, taping  PLAN FOR NEXT SESSION: D/C this visit.  PHYSICAL THERAPY DISCHARGE SUMMARY  Visits from Start of Care: 9  Current functional level related to goals / functional outcomes: Achieved all goals   Remaining deficits: Continued breast edema but greatly improved   Education / Equipment: Foam pack, compression bra, MLD   Patient  agrees to discharge. Patient goals were met. Patient is being discharged due to meeting the stated rehab goals.   Otelia Limes, PTA 04/14/2022, 2:09 PM

## 2022-04-28 DIAGNOSIS — D2272 Melanocytic nevi of left lower limb, including hip: Secondary | ICD-10-CM | POA: Diagnosis not present

## 2022-04-28 DIAGNOSIS — D2261 Melanocytic nevi of right upper limb, including shoulder: Secondary | ICD-10-CM | POA: Diagnosis not present

## 2022-04-28 DIAGNOSIS — D2271 Melanocytic nevi of right lower limb, including hip: Secondary | ICD-10-CM | POA: Diagnosis not present

## 2022-04-28 DIAGNOSIS — L821 Other seborrheic keratosis: Secondary | ICD-10-CM | POA: Diagnosis not present

## 2022-04-28 DIAGNOSIS — L814 Other melanin hyperpigmentation: Secondary | ICD-10-CM | POA: Diagnosis not present

## 2022-04-28 DIAGNOSIS — D225 Melanocytic nevi of trunk: Secondary | ICD-10-CM | POA: Diagnosis not present

## 2022-04-28 NOTE — Progress Notes (Signed)
TeleHealth Visit:  This visit was completed with telemedicine (audio/video) technology. Elizabeth Cummings has verbally consented to this TeleHealth visit. The patient is located at home, the provider is located at home. The participants in this visit include the listed provider and patient. The visit was conducted today via MyChart video.  OBESITY [name] is here to discuss her progress with her obesity treatment plan along with follow-up of her obesity related diagnoses.    Today's visit was # 19  Starting weight: 231 lbs Starting date: 11/14/20 Weight reported at last virtual office visit: 184.8 lbs on 03/30/22 Today's reported weight: 184.6 lbs Total weight loss: 47 Weight change since last visit: 0  Today's vital per pt: T- 97.2 BP- 118/78 HR- 98 R- 16 O2 sat on RA- 97   Nutrition Plan: practicing portion control and making smarter food choices, such as increasing vegetables and decreasing simple carbohydrates daily   Current exercise:  taking steps rather than elevator and walking 14 minutes/day. 10000-12000 steps/day on workdays.  Interim History: Elizabeth Cummings reports she is doing better with protein intake.  She has been working some long shifts a few days per week and was previously skipping dinner but is now adding a yogurt or protein shake in the evening.  She does feel she is snacking more when she works a long shift to stay awake.  Snacks on chips or peanuts.  She has only lost 2/10 of a pound in the last month.  Says she is never hungry.  Assessment/Plan:  1. Type II Diabetes Well-controlled.  Last A1c was 6.1 on 12/23/2021. Medication(s): Mounjaro 15 mg weekly  Lab Results  Component Value Date   HGBA1C 6.1 (H) 12/23/2021   HGBA1C 6.3 (H) 06/12/2021   HGBA1C 6.6 03/14/2021   Lab Results  Component Value Date   LDLCALC 90 12/23/2021   CREATININE 0.77 12/23/2021    Plan: Refill Mounjaro 15 mg weekly   2. Obesity: Current BMI 31 Elizabeth Cummings is currently in the  action stage of change. As such, her goal is to continue with weight loss efforts.  She has agreed to practicing portion control and making smarter food choices, such as increasing vegetables and decreasing simple carbohydrates.   Discussed having raw vegetables to snack on at work.   Also take hand weights to work to use rather than snacking.  Exercise goals: Add hand weights 2 to 3 days/week.  Behavioral modification strategies: increasing lean protein intake, decreasing simple carbohydrates, ways to avoid boredom eating, better snacking choices, and planning for success.  [name] has agreed to follow-up with our clinic in 4 weeks.   No orders of the defined types were placed in this encounter.   Medications Discontinued During This Encounter  Medication Reason   tirzepatide [facility]) 15 MG/0.5ML Pen Reorder     Meds ordered this encounter  Medications   tirzepatide (MOUNJARO) 15 MG/0.5ML Pen    Sig: Inject 15 mg into the skin once a week.    Dispense:  6 mL    Refill:  0    Order Specific Question:   Supervising Provider    Answer:   Dell Ponto [[id number]]      Objective:   VITALS: Per patient if applicable, see vitals. GENERAL: Alert and in no acute distress. CARDIOPULMONARY: No increased WOB. Speaking in clear sentences.  PSYCH: Pleasant and cooperative. Speech normal rate and rhythm. Affect is appropriate. Insight and judgement are appropriate. Attention is focused, linear, and appropriate.  NEURO: Oriented as arrived to appointment on  time with no prompting.   Lab Results  Component Value Date   CREATININE 0.77 12/23/2021   BUN 11 12/23/2021   NA 140 12/23/2021   K 3.5 12/23/2021   CL 97 12/23/2021   CO2 24 12/23/2021   Lab Results  Component Value Date   ALT 18 12/23/2021   AST 21 12/23/2021   ALKPHOS 88 12/23/2021   BILITOT 0.3 12/23/2021   Lab Results  Component Value Date   HGBA1C 6.1 (H) 12/23/2021   HGBA1C 6.3 (H) 06/12/2021   HGBA1C 6.6  03/14/2021   HGBA1C 6.4 (H) 11/14/2020   HGBA1C 6.4 12/27/2014   Lab Results  Component Value Date   INSULIN 20.3 11/14/2020   Lab Results  Component Value Date   TSH 0.785 11/14/2020   Lab Results  Component Value Date   CHOL 160 12/23/2021   HDL 44 12/23/2021   LDLCALC 90 12/23/2021   TRIG 150 (H) 12/23/2021   Lab Results  Component Value Date   WBC 7.7 12/23/2021   HGB 13.2 12/23/2021   HCT 41.0 12/23/2021   MCV 85 12/23/2021   PLT 372 12/23/2021   Lab Results  Component Value Date   IRON 58 05/08/2014   TIBC 506 (H) 05/08/2014   FERRITIN 36 05/08/2014   Lab Results  Component Value Date   VD25OH 41.1 12/23/2021   VD25OH 50.3 06/12/2021   VD25OH 50.9 11/14/2020    Attestation Statements:   Reviewed by clinician on day of visit: allergies, medications, problem list, medical history, surgical history, family history, social history, and previous encounter notes.  Time spent on visit including the items listed below was 30 minutes.  -preparing to see the patient (e.g., review of tests, history, previous notes) -obtaining and/or reviewing separately obtained history -counseling and educating the patient/family/caregiver -documenting clinical information in the electronic or other health record -ordering medications, tests, or procedures -independently interpreting results and communicating results to the patient/ family/caregiver -referring and communicating with other health care professionals  -care coordination

## 2022-04-29 ENCOUNTER — Other Ambulatory Visit: Payer: Self-pay

## 2022-04-29 ENCOUNTER — Encounter (INDEPENDENT_AMBULATORY_CARE_PROVIDER_SITE_OTHER): Payer: Self-pay | Admitting: Family Medicine

## 2022-04-29 ENCOUNTER — Telehealth (INDEPENDENT_AMBULATORY_CARE_PROVIDER_SITE_OTHER): Payer: 59 | Admitting: Family Medicine

## 2022-04-29 ENCOUNTER — Other Ambulatory Visit (HOSPITAL_COMMUNITY): Payer: Self-pay

## 2022-04-29 VITALS — Ht 64.0 in | Wt 184.0 lb

## 2022-04-29 DIAGNOSIS — Z6831 Body mass index (BMI) 31.0-31.9, adult: Secondary | ICD-10-CM | POA: Diagnosis not present

## 2022-04-29 DIAGNOSIS — E669 Obesity, unspecified: Secondary | ICD-10-CM | POA: Diagnosis not present

## 2022-04-29 DIAGNOSIS — Z7985 Long-term (current) use of injectable non-insulin antidiabetic drugs: Secondary | ICD-10-CM

## 2022-04-29 DIAGNOSIS — E1169 Type 2 diabetes mellitus with other specified complication: Secondary | ICD-10-CM | POA: Diagnosis not present

## 2022-04-29 MED ORDER — TIRZEPATIDE 15 MG/0.5ML ~~LOC~~ SOAJ
15.0000 mg | SUBCUTANEOUS | 0 refills | Status: DC
  Filled 2022-04-29 – 2022-06-08 (×3): qty 6, 84d supply, fill #0
  Filled 2022-07-08: qty 2, 28d supply, fill #0
  Filled 2022-07-13: qty 6, 84d supply, fill #0

## 2022-04-30 ENCOUNTER — Other Ambulatory Visit (HOSPITAL_COMMUNITY): Payer: Self-pay

## 2022-04-30 MED ORDER — HYDROCHLOROTHIAZIDE 25 MG PO TABS
25.0000 mg | ORAL_TABLET | Freq: Every day | ORAL | 2 refills | Status: DC
  Filled 2022-04-30: qty 90, 90d supply, fill #0
  Filled 2022-08-03: qty 90, 90d supply, fill #1
  Filled 2022-10-29: qty 90, 90d supply, fill #2

## 2022-05-06 DIAGNOSIS — M62830 Muscle spasm of back: Secondary | ICD-10-CM | POA: Diagnosis not present

## 2022-05-06 DIAGNOSIS — M9903 Segmental and somatic dysfunction of lumbar region: Secondary | ICD-10-CM | POA: Diagnosis not present

## 2022-05-19 DIAGNOSIS — H52223 Regular astigmatism, bilateral: Secondary | ICD-10-CM | POA: Diagnosis not present

## 2022-05-19 DIAGNOSIS — H5213 Myopia, bilateral: Secondary | ICD-10-CM | POA: Diagnosis not present

## 2022-05-19 DIAGNOSIS — E119 Type 2 diabetes mellitus without complications: Secondary | ICD-10-CM | POA: Diagnosis not present

## 2022-05-19 DIAGNOSIS — H16143 Punctate keratitis, bilateral: Secondary | ICD-10-CM | POA: Diagnosis not present

## 2022-05-19 DIAGNOSIS — H524 Presbyopia: Secondary | ICD-10-CM | POA: Diagnosis not present

## 2022-05-22 ENCOUNTER — Ambulatory Visit
Admission: RE | Admit: 2022-05-22 | Discharge: 2022-05-22 | Disposition: A | Discharge status: Home / Self Care | Payer: 59 | Source: Ambulatory Visit | Attending: Adult Health | Admitting: Adult Health

## 2022-05-22 DIAGNOSIS — R928 Other abnormal and inconclusive findings on diagnostic imaging of breast: Secondary | ICD-10-CM | POA: Diagnosis not present

## 2022-05-22 DIAGNOSIS — Z853 Personal history of malignant neoplasm of breast: Secondary | ICD-10-CM | POA: Diagnosis not present

## 2022-05-22 DIAGNOSIS — C50412 Malignant neoplasm of upper-outer quadrant of left female breast: Secondary | ICD-10-CM

## 2022-05-27 ENCOUNTER — Telehealth (INDEPENDENT_AMBULATORY_CARE_PROVIDER_SITE_OTHER): Payer: 59 | Admitting: Family Medicine

## 2022-06-08 ENCOUNTER — Other Ambulatory Visit (HOSPITAL_COMMUNITY): Payer: Self-pay

## 2022-06-09 ENCOUNTER — Other Ambulatory Visit (HOSPITAL_COMMUNITY): Payer: Self-pay

## 2022-06-10 DIAGNOSIS — M62830 Muscle spasm of back: Secondary | ICD-10-CM | POA: Diagnosis not present

## 2022-06-10 DIAGNOSIS — M9903 Segmental and somatic dysfunction of lumbar region: Secondary | ICD-10-CM | POA: Diagnosis not present

## 2022-06-11 ENCOUNTER — Other Ambulatory Visit: Payer: Self-pay

## 2022-06-17 ENCOUNTER — Other Ambulatory Visit (HOSPITAL_COMMUNITY): Payer: Self-pay

## 2022-06-17 NOTE — Progress Notes (Signed)
Patient Care Team: Mayra Neer, MD as PCP - General (Family Medicine) Bensimhon, Shaune Pascal, MD as Consulting Physician (Cardiology) Tiajuana Amass, MD as Referring Physician (Allergy and Immunology) Nicholas Lose, MD as Consulting Physician (Hematology and Oncology) Eppie Gibson, MD as Attending Physician (Radiation Oncology) Rolm Bookbinder, MD as Consulting Physician (General Surgery)  DIAGNOSIS:  Encounter Diagnosis  Name Primary?   Malignant neoplasm of upper-outer quadrant of left breast in female, estrogen receptor positive Yes    SUMMARY OF ONCOLOGIC HISTORY: Oncology History  Malignant neoplasm of upper-outer quadrant of left breast in female, estrogen receptor positive  06/27/2021 Initial Diagnosis   Screening mammogram detected left breast asymmetry and distortion.  No ultrasound correlate.  Axilla negative, stereotactic biopsy showed grade 1 invasive lobular cancer with LCIS, negative for lymphovascular invasion, ER 90%, PR 80%, Ki-67 4%, HER2 equivocal by IHC, FISH negative, ratio 1.21   07/10/2021 Cancer Staging   Staging form: Breast, AJCC 8th Edition - Clinical: Stage Unknown (cTX, cN0, cM0, G1, ER+, PR+, HER2-) - Signed by Nicholas Lose, MD on 07/10/2021 Stage prefix: Initial diagnosis Histologic grading system: 3 grade system    Genetic Testing   Negative genetic testing. No pathogenic variants identified on the [facility] BRCAPlus+ CancerNext-Expanded+RNA panel. VUS in KIF1B called c.5177C>G identified. The report date is 07/25/2021.  The CancerNext-Expanded + RNAinsight gene panel offered by [employer] and includes sequencing and rearrangement analysis for the following 77 genes: IP, ALK, APC*, ATM*, AXIN2, BAP1, BARD1, BLM, BMPR1A, BRCA1*, BRCA2*, BRIP1*, CDC73, CDH1*,CDK4, CDKN1B, CDKN2A, CHEK2*, CTNNA1, DICER1, FANCC, FH, FLCN, GALNT12, KIF1B, LZTR1, MAX, MEN1, MET, MLH1*, MSH2*, MSH3, MSH6*, MUTYH*, NBN, NF1*, NF2, NTHL1, PALB2*, PHOX2B, PMS2*, POT1, PRKAR1A,  PTCH1, PTEN*, RAD51C*, RAD51D*,RB1, RECQL, RET, SDHA, SDHAF2, SDHB, SDHC, SDHD, SMAD4, SMARCA4, SMARCB1, SMARCE1, STK11, SUFU, TMEM127, TP53*,TSC1, TSC2, VHL and XRCC2 (sequencing and deletion/duplication); EGFR, EGLN1, HOXB13, KIT, MITF, PDGFRA, POLD1 and POLE (sequencing only); EPCAM and GREM1 (deletion/duplication only).   07/28/2021 Surgery   07/28/2021: Left lumpectomy: Grade 2 ILC 3.5 cm with LCIS, intraductal papilloma with UBH, margins negative, 1 lymph node with isolated tumor cells, ER 90%, PR 80%, HER2 negative, Ki-67 4%   08/05/2021 Cancer Staging   Staging form: Breast, AJCC 8th Edition - Pathologic: Stage IA (pT2, pN0(i+), cM0, G2, ER+, PR+, HER2-) - Signed by Nicholas Lose, MD on 08/05/2021 Multigene prognostic tests performed: Oncotype DX Histologic grading system: 3 grade system   08/12/2021 Oncotype testing   Oncotype score 14: Distant recurrence at 9 years: 4%   09/11/2021 - 10/08/2021 Radiation Therapy   Site Technique Total Dose (Gy) Dose per Fx (Gy) Completed Fx Beam Energies  Breast, Left: Breast_L_Axilla 3D 40.05/40.05 2.67 15/15 10X  Breast, Left: Breast_L_Bst 3D 10/10 2 5/5 6X, 10X     10/07/2021 -  Anti-estrogen oral therapy   20 MG Tamoxifen x 10 years     CHIEF COMPLIANT: Left breast cancer surveillance on tamoxifen therapy  INTERVAL HISTORY: Elizabeth Cummings is a 65-year with above-mentioned history of left breast cancer. She presents to the clinic today for a follow-up. She reports that she feels good since her weight lost journey. She is tolerating the tamoxifen extremely well. She does have manageable hot flashes. She has some swelling under her left breast and under left axilla. She says it does feel heavy.   ALLERGIES:  is allergic to gluten meal and dilaudid [hydromorphone hcl].  MEDICATIONS:  Current Outpatient Medications  Medication Sig Dispense Refill   azelastine (ASTELIN) 0.1 % nasal spray Place  1-2 sprays into both nostrils 2 (two) times daily. 30  mL 5   calcium-vitamin D (OSCAL WITH D) 500-200 MG-UNIT per tablet Take 1 tablet by mouth daily.     Cholecalciferol (VITAMIN D) 50 MCG (2000 UT) CAPS Take 1 capsule (2,000 Units total) by mouth daily. 30 capsule 0   ciprofloxacin (CIPRO) 500 MG tablet Take 1 tablet (500 mg total) by mouth every 12 (twelve) hours. 30 tablet 0   [employer] FIBER GUMMIES PO Take 2 Units by mouth daily.     fexofenadine (ALLEGRA) 180 MG tablet Take 1 tablet Orally Once a day in the morning. (please fill with levocetirizine) 90 tablet 3   fluticasone (FLONASE) 50 MCG/ACT nasal spray Place 2 sprays into both nostrils daily. 16 g 6   glucose blood (FREESTYLE TEST STRIPS) test strip Use as instructed test 2 times daily as directed 300 each 1   hydrochlorothiazide (HYDRODIURIL) 25 MG tablet Take 1 tablet (25 mg total) by mouth daily for edema 90 tablet 2   Lancets (FREESTYLE) lancets Use as instructed, test 2 times daily  as directed 300 each 1   levocetirizine (XYZAL) 5 MG tablet Take 1 tablet (5 mg total) by mouth every evening. 90 tablet 3   levonorgestrel (MIRENA, 52 MG,) 20 MCG/DAY IUD 1 Intra Uterine Device by Intrauterine route once.     Multiple Vitamin (MULTIVITAMIN) tablet Take 1 tablet by mouth daily.     pravastatin (PRAVACHOL) 20 MG tablet Take 1 tablet (20 mg total) by mouth 3 times a week on Monday, Wednesday, and Friday. 90 tablet 0   Probiotic Product (DIGESTIVE ADV DIGESTIVE/IMMUNE PO) Take 1 Units by mouth daily.     tamoxifen (NOLVADEX) 20 MG tablet Take 1 tablet (20 mg total) by mouth daily. 90 tablet 3   tirzepatide (MOUNJARO) 15 MG/0.5ML Pen Inject 15 mg into the skin once a week. 6 mL 0   No current facility-administered medications for this visit.    PHYSICAL EXAMINATION: ECOG PERFORMANCE STATUS: 1 - Symptomatic but completely ambulatory  Vitals:   06/25/22 1517  BP: 118/75  Pulse: 94  Resp: 18  Temp: 97.7 F (36.5 C)  SpO2: 99%   Filed Weights   06/25/22 1517  Weight: 184 lb 14.4 oz  (83.9 kg)      LABORATORY DATA:  I have reviewed the data as listed    Latest Ref Rng & Units 06/25/2022   10:30 AM 12/23/2021    7:41 AM 07/25/2021    8:44 AM  CMP  Glucose 70 - 99 mg/dL 101  90  93   BUN 6 - 20 mg/dL 12  11  10    Creatinine 0.44 - 1.00 mg/dL 0.73  0.77  0.70   Sodium 135 - 145 mmol/L 140  140  138   Potassium 3.5 - 5.1 mmol/L 3.1  3.5  3.3   Chloride 98 - 111 mmol/L 102  97  101   CO2 22 - 32 mmol/L 26  24  27    Calcium 8.9 - 10.3 mg/dL 9.7  9.5  9.3   Total Protein 6.5 - 8.1 g/dL 8.1  7.3    Total Bilirubin 0.3 - 1.2 mg/dL 0.7  0.3    Alkaline Phos 38 - 126 U/L 66  88    AST 15 - 41 U/L 23  21    ALT 0 - 44 U/L 20  18      Lab Results  Component Value Date   WBC 7.7 12/23/2021  HGB 13.2 12/23/2021   HCT 41.0 12/23/2021   MCV 85 12/23/2021   PLT 372 12/23/2021   NEUTROABS 4.9 12/23/2021    ASSESSMENT & PLAN:  Malignant neoplasm of upper-outer quadrant of left breast in female, estrogen receptor positive ([place]) 07/28/2021: Left lumpectomy: Grade 2 ILC 3.5 cm with LCIS, intraductal papilloma with UBH, margins negative, 1 lymph node with isolated tumor cells, ER 90%, PR 80%, HER2 negative, Ki-67 4% Oncotype score 14: Distant recurrence at 9 years: 4% 09/11/2021-10/08/2021 Adjuvant radiation:    Current treatment: adjuvant antiestrogen therapy with tamoxifen 20 mg daily x10 years (she will start at 10 mg a day for the first month and then increase it to 20 if she tolerates it well), plan to switch to aromatase inhibitor once she is in menopause    Tamoxifen toxicities: Mild hot flashes.  Weight: Patient goes to the healthy weight and wellness clinic and has lost 30 pounds with Monjuro. 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  Breast cancer surveillance: Mammogram 05/22/2022: Benign breast density category B Breast exam 06/25/2022: Benign  We will alternate mammograms and breast MRIs.  We will set her up for breast MRI in September.  She plans to see Dr.  Donne Hazel after the breast MRI in September. Return to clinic in 1 year for follow-up    Orders Placed This Encounter  Procedures   MR BREAST BILATERAL W [facility] CAD    Standing Status:   Future    Standing Expiration Date:   06/25/2023    Order Specific Question:   If indicated for the ordered procedure, I authorize the administration of contrast media per Radiology protocol    Answer:   Yes    Order Specific Question:   What is the patient's sedation requirement?    Answer:   No Sedation    Order Specific Question:   Does the patient have a pacemaker or implanted devices?    Answer:   No    Order Specific Question:   Preferred imaging location?    Answer:   [facility] (table limit-550lbs)   The patient has a good understanding of the overall plan. she agrees with it. she will call with any problems that may develop before the next visit here. Total time spent: 30 mins including face to face time and time spent for planning, charting and co-ordination of care   Harriette Ohara, MD 06/25/22    I Gardiner Coins am acting as a Education administrator for [employer]  I have reviewed the above documentation for accuracy and completeness, and I agree with the above.

## 2022-06-19 ENCOUNTER — Other Ambulatory Visit: Payer: Self-pay

## 2022-06-19 ENCOUNTER — Encounter (INDEPENDENT_AMBULATORY_CARE_PROVIDER_SITE_OTHER): Payer: Self-pay | Admitting: Family Medicine

## 2022-06-19 ENCOUNTER — Other Ambulatory Visit (HOSPITAL_COMMUNITY): Payer: Self-pay

## 2022-06-19 MED ORDER — TIRZEPATIDE 12.5 MG/0.5ML ~~LOC~~ SOAJ
12.5000 mg | SUBCUTANEOUS | 0 refills | Status: DC
  Filled 2022-06-19 (×2): qty 2, 28d supply, fill #0

## 2022-06-22 ENCOUNTER — Ambulatory Visit: Payer: 59 | Admitting: Hematology and Oncology

## 2022-06-22 ENCOUNTER — Other Ambulatory Visit: Payer: Self-pay

## 2022-06-23 ENCOUNTER — Telehealth (INDEPENDENT_AMBULATORY_CARE_PROVIDER_SITE_OTHER): Payer: Self-pay | Admitting: *Deleted

## 2022-06-23 NOTE — Telephone Encounter (Signed)
Prior authorization done via cover my meds. Per patients insurance they do not need a PA at this time and her Darcel Bayley should be covered. Patient notified.

## 2022-06-23 NOTE — Progress Notes (Unsigned)
  TeleHealth Visit:  This visit was completed with telemedicine (audio/video) technology. Elizabeth Cummings has verbally consented to this TeleHealth visit. The patient is located at home, the provider is located at home. The participants in this visit include the listed provider and patient. The visit was conducted today via MyChart video.  OBESITY Elizabeth Cummings is here to discuss her progress with her obesity treatment plan along with follow-up of her obesity related diagnoses.    Today's visit was # 20  Starting weight: 231 lbs Starting date: 11/14/20 Weight reported at last virtual office visit: 184.6 lbs on 04/29/22 Today's reported weight (06/24/22): {dwwweightreported:29243} Weight at clinic on ***: *** lbs Total weight loss: *** Weight change since last visit: ***   Today's vital per pt: T-  BP-  HR-  R-  O2 sat on RA-   Nutrition Plan: practicing portion control and making smarter food choices, such as increasing vegetables and decreasing simple carbohydrates - ***% adherence.  Current exercise: {exercise types:16438} taking steps rather than elevator and walking 14 minutes/day. 10000-12000 steps/day on workday  Goal weight 174 (29 BMI).  Interim History:  *** Eating all of the prescribed protein: {yes***/no:17258} Skipping meals: {dwwyes:29172} Drinking adequate water: {dwwyes:29172} Drinking sugar sweetened beverages: {dwwyes:29172} Hunger controlled: {EWCONTROLASSESSMENT:24261}. Cravings controlled:  {EWCONTROLASSESSMENT:24261}. Journaling Consistently:  {dwwyes:29172} Meeting protein goals:  {dwwyes:29172} Meeting calorie goals:  {dwwyes:29172}    Pharmacotherapy: Elizabeth Cummings is on {dwwglp:29109}. Adverse side effects: {dwwse:29122} Hunger is {EWCONTROLASSESSMENT:24261}.  Assessment/Plan:  1. ***  2. ***  3. ***  {dwwmorbid:29108::"Morbid Obesity"}: Current BMI *** Pharmacotherapy Plan {dwwmed:29123} {dwwglp:29109}.  Elizabeth Cummings {CHL AMB IS/IS NOT:210130109}  currently in the action stage of change. As such, her goal is to {MWMwtloss#1:210800005}.  She has agreed to {dwwsldiets:29085}.  Exercise goals: {MWM EXERCISE RECS:23473}  Behavioral modification strategies: {dwwslwtlossstrategies:29088}.  Elizabeth Cummings has agreed to follow-up with our clinic in {NUMBER 1-10:22536} weeks.   No orders of the defined types were placed in this encounter.   There are no discontinued medications.   No orders of the defined types were placed in this encounter.     Objective:   VITALS: Per patient if applicable, see vitals. GENERAL: Alert and in no acute distress. CARDIOPULMONARY: No increased WOB. Speaking in clear sentences.  PSYCH: Pleasant and cooperative. Speech normal rate and rhythm. Affect is appropriate. Insight and judgement are appropriate. Attention is focused, linear, and appropriate.  NEURO: Oriented as arrived to appointment on time with no prompting.   Attestation Statements:   Reviewed by clinician on day of visit: allergies, medications, problem list, medical history, surgical history, family history, social history, and previous encounter notes.  ***(delete if time-based billing not used) Time spent on visit including the items listed below was *** minutes.  -preparing to see the patient (e.g., review of tests, history, previous notes) -obtaining and/or reviewing separately obtained history -counseling and educating the patient/family/caregiver -documenting clinical information in the electronic or other health record -ordering medications, tests, or procedures -independently interpreting results and communicating results to the patient/ family/caregiver -referring and communicating with other health care professionals  -care coordination    This was prepared with the assistance of Dragon [employer].  Occasional wrong-word or sound-a-like substitutions may have occurred due to the inherent limitations of voice recognition software.

## 2022-06-24 ENCOUNTER — Encounter (INDEPENDENT_AMBULATORY_CARE_PROVIDER_SITE_OTHER): Payer: Self-pay | Admitting: Family Medicine

## 2022-06-24 ENCOUNTER — Telehealth (INDEPENDENT_AMBULATORY_CARE_PROVIDER_SITE_OTHER): Payer: 59 | Admitting: Family Medicine

## 2022-06-24 VITALS — Ht 64.0 in | Wt 184.0 lb

## 2022-06-24 DIAGNOSIS — Z6836 Body mass index (BMI) 36.0-36.9, adult: Secondary | ICD-10-CM

## 2022-06-24 DIAGNOSIS — E1169 Type 2 diabetes mellitus with other specified complication: Secondary | ICD-10-CM | POA: Diagnosis not present

## 2022-06-24 DIAGNOSIS — E559 Vitamin D deficiency, unspecified: Secondary | ICD-10-CM

## 2022-06-24 DIAGNOSIS — E669 Obesity, unspecified: Secondary | ICD-10-CM

## 2022-06-24 DIAGNOSIS — E785 Hyperlipidemia, unspecified: Secondary | ICD-10-CM | POA: Diagnosis not present

## 2022-06-24 DIAGNOSIS — Z6831 Body mass index (BMI) 31.0-31.9, adult: Secondary | ICD-10-CM

## 2022-06-24 DIAGNOSIS — Z7985 Long-term (current) use of injectable non-insulin antidiabetic drugs: Secondary | ICD-10-CM

## 2022-06-25 ENCOUNTER — Telehealth: Payer: Self-pay | Admitting: Hematology and Oncology

## 2022-06-25 ENCOUNTER — Other Ambulatory Visit (INDEPENDENT_AMBULATORY_CARE_PROVIDER_SITE_OTHER): Payer: Self-pay | Admitting: Family Medicine

## 2022-06-25 ENCOUNTER — Inpatient Hospital Stay: Payer: 59 | Attending: Hematology and Oncology | Admitting: Hematology and Oncology

## 2022-06-25 ENCOUNTER — Encounter (HOSPITAL_COMMUNITY)
Admission: RE | Admit: 2022-06-25 | Discharge: 2022-06-25 | Disposition: A | Discharge status: Home / Self Care | Payer: 59 | Source: Ambulatory Visit | Attending: *Deleted | Admitting: *Deleted

## 2022-06-25 VITALS — BP 118/75 | HR 94 | Temp 97.7°F | Resp 18 | Ht 64.0 in | Wt 184.9 lb

## 2022-06-25 DIAGNOSIS — Z923 Personal history of irradiation: Secondary | ICD-10-CM | POA: Insufficient documentation

## 2022-06-25 DIAGNOSIS — Z79899 Other long term (current) drug therapy: Secondary | ICD-10-CM | POA: Insufficient documentation

## 2022-06-25 DIAGNOSIS — E785 Hyperlipidemia, unspecified: Secondary | ICD-10-CM | POA: Insufficient documentation

## 2022-06-25 DIAGNOSIS — Z793 Long term (current) use of hormonal contraceptives: Secondary | ICD-10-CM | POA: Diagnosis not present

## 2022-06-25 DIAGNOSIS — E1169 Type 2 diabetes mellitus with other specified complication: Secondary | ICD-10-CM | POA: Diagnosis not present

## 2022-06-25 DIAGNOSIS — Z170 Estrogen receptor positive status [ER+]: Secondary | ICD-10-CM | POA: Insufficient documentation

## 2022-06-25 DIAGNOSIS — C50412 Malignant neoplasm of upper-outer quadrant of left female breast: Secondary | ICD-10-CM | POA: Insufficient documentation

## 2022-06-25 DIAGNOSIS — Z01812 Encounter for preprocedural laboratory examination: Secondary | ICD-10-CM | POA: Insufficient documentation

## 2022-06-25 DIAGNOSIS — E559 Vitamin D deficiency, unspecified: Secondary | ICD-10-CM | POA: Insufficient documentation

## 2022-06-25 DIAGNOSIS — E876 Hypokalemia: Secondary | ICD-10-CM

## 2022-06-25 DIAGNOSIS — Z79810 Long term (current) use of selective estrogen receptor modulators (SERMs): Secondary | ICD-10-CM | POA: Insufficient documentation

## 2022-06-25 LAB — COMPREHENSIVE METABOLIC PANEL
ALT: 20 U/L (ref 0–44)
AST: 23 U/L (ref 15–41)
Albumin: 4.3 g/dL (ref 3.5–5.0)
Alkaline Phosphatase: 66 U/L (ref 38–126)
Anion gap: 12 (ref 5–15)
BUN: 12 mg/dL (ref 6–20)
CO2: 26 mmol/L (ref 22–32)
Calcium: 9.7 mg/dL (ref 8.9–10.3)
Chloride: 102 mmol/L (ref 98–111)
Creatinine, Ser: 0.73 mg/dL (ref 0.44–1.00)
GFR, Estimated: >60 mL/min (ref >60)
Glucose, Bld: 101 mg/dL — ABNORMAL HIGH (ref 70–99)
Potassium: 3.1 mmol/L — ABNORMAL LOW (ref 3.5–5.1)
Sodium: 140 mmol/L (ref 135–145)
Total Bilirubin: 0.7 mg/dL (ref 0.3–1.2)
Total Protein: 8.1 g/dL (ref 6.5–8.1)

## 2022-06-25 LAB — LIPID PANEL
Cholesterol: 166 mg/dL (ref 0–200)
HDL: 55 mg/dL (ref >40)
LDL Cholesterol: 92 mg/dL (ref 0–99)
Total CHOL/HDL Ratio: 3 RATIO
Triglycerides: 95 mg/dL (ref <150)
VLDL: 19 mg/dL (ref 0–40)

## 2022-06-25 LAB — HEMOGLOBIN A1C
Hgb A1c MFr Bld: 6.2 % — ABNORMAL HIGH (ref 4.8–5.6)
Mean Plasma Glucose: 131 mg/dL

## 2022-06-25 LAB — VITAMIN D 25 HYDROXY (VIT D DEFICIENCY, FRACTURES): Vit D, 25-Hydroxy: 55.44 ng/mL (ref 30–100)

## 2022-06-25 NOTE — Assessment & Plan Note (Addendum)
07/28/2021: Left lumpectomy: Grade 2 ILC 3.5 cm with LCIS, intraductal papilloma with UBH, margins negative, 1 lymph node with isolated tumor cells, ER 90%, PR 80%, HER2 negative, Ki-67 4% Oncotype score 14: Distant recurrence at 9 years: 4% 09/11/2021-10/08/2021 Adjuvant radiation:    Current treatment: adjuvant antiestrogen therapy with tamoxifen 20 mg daily x10 years (she will start at 10 mg a day for the first month and then increase it to 20 if she tolerates it well), plan to switch to aromatase inhibitor once she is in menopause    Tamoxifen toxicities: Mild hot flashes.  Weight: Patient goes to the healthy weight and wellness clinic and has lost 30 pounds with Monjuro. 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  Breast cancer surveillance: Mammogram 05/22/2022: Benign breast density category B Breast exam 06/25/2022: Benign  Return to clinic in 1 year for follow-up

## 2022-06-25 NOTE — Telephone Encounter (Signed)
Reached out to schedule patient from 4/4 LOS, patient aware of date and time of appointment.

## 2022-07-01 DIAGNOSIS — E876 Hypokalemia: Secondary | ICD-10-CM | POA: Diagnosis not present

## 2022-07-02 LAB — POTASSIUM: Potassium: 3.5 mmol/L (ref 3.5–5.2)

## 2022-07-08 ENCOUNTER — Other Ambulatory Visit (HOSPITAL_COMMUNITY): Payer: Self-pay

## 2022-07-08 LAB — SIGNATERA
SIGNATERA MTM READOUT: 0 MTM/ml
SIGNATERA TEST RESULT: NEGATIVE

## 2022-07-09 ENCOUNTER — Telehealth: Payer: Self-pay

## 2022-07-09 NOTE — Telephone Encounter (Signed)
Called and given negative Signatera results, Pt verbalized understanding.

## 2022-07-13 ENCOUNTER — Other Ambulatory Visit (HOSPITAL_COMMUNITY): Payer: Self-pay

## 2022-07-14 ENCOUNTER — Other Ambulatory Visit (HOSPITAL_COMMUNITY): Payer: Self-pay

## 2022-07-14 MED ORDER — PRAVASTATIN SODIUM 20 MG PO TABS
20.0000 mg | ORAL_TABLET | Freq: Every day | ORAL | 1 refills | Status: DC
  Filled 2022-07-14: qty 90, 90d supply, fill #0
  Filled 2022-10-19: qty 90, 90d supply, fill #1

## 2022-07-21 NOTE — Progress Notes (Deleted)
  TeleHealth Visit:  This visit was completed with telemedicine (audio/video) technology. Elizabeth Cummings has verbally consented to this TeleHealth visit. The patient is located at home, the provider is located at home. The participants in this visit include the listed provider and patient. The visit was conducted today via MyChart video.  OBESITY Elizabeth Cummings is here to discuss her progress with her obesity treatment plan along with follow-up of her obesity related diagnoses.    Today's visit was # 21  Starting weight: 231 lbs Starting date: 11/14/20 Weight reported at last virtual office visit: 184.4 lbs on 06/24/22 Today's reported weight (***): {dwwweightreported:29243} Weight at clinic on ***: *** lbs Total weight loss: *** Weight change since last visit: ***  Today's vital per pt: T- *** BP- *** HR- *** R- *** O2 sat on RA- ***  Current exercise: {exercise types:16438} taking steps rather than elevator and walking 14 minutes/day. 10000-12000 steps/day on workday. Using hand weights 2-3 times per week.  Interim History:  *** Barriers identified: {EMOBESITYBARRIERS:28841::"none"}.   Eating all of the prescribed protein: {yes***/no:17258} Skipping meals: {dwwyes:29172} Drinking adequate water: {dwwyes:29172} Drinking sugar sweetened beverages: {dwwyes:29172} Hunger controlled: {EWCONTROLASSESSMENT:24261}. Cravings controlled:  {EWCONTROLASSESSMENT:24261}.  Journaling Consistently:  {dwwyes:29172} Meeting protein goals:  {dwwyes:29172} Meeting calorie goals:  {dwwyes:29172}  Pharmacotherapy: Elizabeth Cummings is on {dwwglp:29109}. Adverse side effects: {dwwse:29122} Hunger is {EWCONTROLASSESSMENT:24261}.  Assessment/Plan:  1. ***  2. ***  3. ***  {dwwmorbid:29108::"Morbid Obesity"}: Current BMI ***  Pharmacotherapy Plan {dwwmed:29123} {dwwglp:29109}.   Elizabeth Cummings {CHL AMB IS/IS NOT:210130109} currently in the action stage of change. As such, her goal is to  {MWMwtloss#1:210800005}.  She has agreed to {dwwsldiets:29085}.  Exercise goals: {MWM EXERCISE RECS:23473}  Behavioral modification strategies: {dwwslwtlossstrategies:29088}.  Elizabeth Cummings has agreed to follow-up with our clinic in {NUMBER 1-10:22536} weeks.   No orders of the defined types were placed in this encounter.   There are no discontinued medications.   No orders of the defined types were placed in this encounter.     Objective:   VITALS: Per patient if applicable, see vitals. GENERAL: Alert and in no acute distress. CARDIOPULMONARY: No increased WOB. Speaking in clear sentences.  PSYCH: Pleasant and cooperative. Speech normal rate and rhythm. Affect is appropriate. Insight and judgement are appropriate. Attention is focused, linear, and appropriate.  NEURO: Oriented as arrived to appointment on time with no prompting.   Attestation Statements:   Reviewed by clinician on day of visit: allergies, medications, problem list, medical history, surgical history, family history, social history, and previous encounter notes.  ***(delete if time-based billing not used) Time spent on visit including the items listed below was *** minutes.  -preparing to see the patient (e.g., review of tests, history, previous notes) -obtaining and/or reviewing separately obtained history -counseling and educating the patient/family/caregiver -documenting clinical information in the electronic or other health record -ordering medications, tests, or procedures -independently interpreting results and communicating results to the patient/ family/caregiver -referring and communicating with other health care professionals  -care coordination    This was prepared with the assistance of [employer].  Occasional wrong-word or sound-a-like substitutions may have occurred due to the inherent limitations of voice recognition software.

## 2022-07-22 ENCOUNTER — Telehealth (INDEPENDENT_AMBULATORY_CARE_PROVIDER_SITE_OTHER): Payer: 59 | Admitting: Family Medicine

## 2022-08-18 NOTE — Progress Notes (Signed)
TeleHealth Visit:  This visit was completed with telemedicine (audio/video) technology. Elizabeth Cummings has verbally consented to this TeleHealth visit. The patient is located at home, the provider is located at home. The participants in this visit include the listed provider and patient. The visit was conducted today via MyChart video.  OBESITY Elizabeth Cummings is here to discuss her progress with her obesity treatment plan along with follow-up of her obesity related diagnoses.    Today's visit was # 21  Starting weight: 231 lbs Starting date: 11/14/20 Weight reported at last virtual office visit: 184.4 lbs on 06/24/22 Today's reported weight (08/19/22):  185.8 lbs Total weight loss: 46 lbs Weight change since last visit: +1 lbs  Today's vital per pt: T- 97.7 BP- 120/80 HR- 101 R- 20 O2 sat on RA- 98%   Goal weight 174 (29 BMI).  Nutrition Plan: practicing portion control and making smarter food choices, such as increasing vegetables and decreasing simple carbohydrates  Current exercise:  taking steps rather than elevator and walking 14 minutes/day. 10000-12000 steps/day on workday.   Interim History:  Work has been stressful. She says she hasn't been drinking enough water or doing well with her eating. She still has not met her deductible ($400 left) and visits are very expensive for her. She has not been using hand weights. She has not been getting in enough protein. She is eating out daily. She wants to prepare more meals at home.   She would like to have a visit every 8 weeks until her deductible is met. Assessment/Plan:  1.  Hypokalemia Potassium runs low to low normal. Potassium was 3.1 on April 4.  Upon recheck on April 10-3.5. Lab Results  Component Value Date   K 3.5 07/01/2022   Plan: Continue to monitor. Encouraged increased intake of high potassium foods. Educated about which foods contain high potassium.  2. Type 2 Diabetes Mellitus with other specified  complication, without long-term current use of insulin HgbA1c is at goal. Last A1c was 6.2. CBGs: Not checking Episodes of hypoglycemia: no Medication(s): Mounjaro 15 mg SQ weekly  Lab Results  Component Value Date   HGBA1C 6.2 (H) 06/25/2022   HGBA1C 6.1 (H) 12/23/2021   HGBA1C 6.3 (H) 06/12/2021   Lab Results  Component Value Date   LDLCALC 92 06/25/2022   CREATININE 0.73 06/25/2022   No results found for: "GFR"  Plan: Continue Mounjaro 15 mg SQ weekly  3. Generalized Obesity: Current BMI 31 Elizabeth Cummings is currently in the action stage of change. As such, her goal is to continue with weight loss efforts.  She has agreed to practicing portion control and making smarter food choices, such as increasing vegetables and decreasing simple carbohydrates.  1.  Begin planning and cooking at least 2 dinners at home weekly with enough for leftovers. 2.  Increase water intake. 3.  Be more cognizant of protein intake.  Exercise goals: Add back in hand weights.  Increase NEAT.  Behavioral modification strategies: increasing lean protein intake, decreasing simple carbohydrates , decrease eating out, meal planning , increase water intake, and planning for success.  Elizabeth Cummings has agreed to follow-up with our clinic in 8 weeks.  No orders of the defined types were placed in this encounter.   There are no discontinued medications.   No orders of the defined types were placed in this encounter.     Objective:   VITALS: Per patient if applicable, see vitals. GENERAL: Alert and in no acute distress. CARDIOPULMONARY: No increased WOB. Speaking in clear  sentences.  PSYCH: Pleasant and cooperative. Speech normal rate and rhythm. Affect is appropriate. Insight and judgement are appropriate. Attention is focused, linear, and appropriate.  NEURO: Oriented as arrived to appointment on time with no prompting.   Attestation Statements:   Reviewed by clinician on day of visit: allergies,  medications, problem list, medical history, surgical history, family history, social history, and previous encounter notes.  Time spent on visit including the items listed below was 30 minutes.  -preparing to see the patient (e.g., review of tests, history, previous notes) -obtaining and/or reviewing separately obtained history -counseling and educating the patient/family/caregiver -documenting clinical information in the electronic or other health record -ordering medications, tests, or procedures -independently interpreting results and communicating results to the patient/ family/caregiver -referring and communicating with other health care professionals  -care coordination    This was prepared with the assistance of Dragon [employer].  Occasional wrong-word or sound-a-like substitutions may have occurred due to the inherent limitations of voice recognition software.

## 2022-08-19 ENCOUNTER — Encounter (INDEPENDENT_AMBULATORY_CARE_PROVIDER_SITE_OTHER): Payer: Self-pay | Admitting: Family Medicine

## 2022-08-19 ENCOUNTER — Telehealth (INDEPENDENT_AMBULATORY_CARE_PROVIDER_SITE_OTHER): Payer: 59 | Admitting: Family Medicine

## 2022-08-19 VITALS — Ht 64.0 in | Wt 185.0 lb

## 2022-08-19 DIAGNOSIS — Z6831 Body mass index (BMI) 31.0-31.9, adult: Secondary | ICD-10-CM | POA: Diagnosis not present

## 2022-08-19 DIAGNOSIS — E876 Hypokalemia: Secondary | ICD-10-CM | POA: Diagnosis not present

## 2022-08-19 DIAGNOSIS — Z7985 Long-term (current) use of injectable non-insulin antidiabetic drugs: Secondary | ICD-10-CM | POA: Diagnosis not present

## 2022-08-19 DIAGNOSIS — E1169 Type 2 diabetes mellitus with other specified complication: Secondary | ICD-10-CM | POA: Diagnosis not present

## 2022-08-19 DIAGNOSIS — E669 Obesity, unspecified: Secondary | ICD-10-CM

## 2022-09-21 ENCOUNTER — Other Ambulatory Visit (INDEPENDENT_AMBULATORY_CARE_PROVIDER_SITE_OTHER): Payer: Self-pay | Admitting: Family Medicine

## 2022-09-21 DIAGNOSIS — E1169 Type 2 diabetes mellitus with other specified complication: Secondary | ICD-10-CM

## 2022-09-23 DIAGNOSIS — D473 Essential (hemorrhagic) thrombocythemia: Secondary | ICD-10-CM | POA: Diagnosis not present

## 2022-09-23 DIAGNOSIS — E1169 Type 2 diabetes mellitus with other specified complication: Secondary | ICD-10-CM | POA: Diagnosis not present

## 2022-09-23 DIAGNOSIS — Z0000 Encounter for general adult medical examination without abnormal findings: Secondary | ICD-10-CM | POA: Diagnosis not present

## 2022-09-23 DIAGNOSIS — C50919 Malignant neoplasm of unspecified site of unspecified female breast: Secondary | ICD-10-CM | POA: Diagnosis not present

## 2022-09-23 DIAGNOSIS — D72829 Elevated white blood cell count, unspecified: Secondary | ICD-10-CM | POA: Diagnosis not present

## 2022-09-23 DIAGNOSIS — E669 Obesity, unspecified: Secondary | ICD-10-CM | POA: Diagnosis not present

## 2022-09-23 DIAGNOSIS — L501 Idiopathic urticaria: Secondary | ICD-10-CM | POA: Diagnosis not present

## 2022-09-23 DIAGNOSIS — E559 Vitamin D deficiency, unspecified: Secondary | ICD-10-CM | POA: Diagnosis not present

## 2022-09-23 DIAGNOSIS — K901 Tropical sprue: Secondary | ICD-10-CM | POA: Diagnosis not present

## 2022-09-23 DIAGNOSIS — E7800 Pure hypercholesterolemia, unspecified: Secondary | ICD-10-CM | POA: Diagnosis not present

## 2022-09-23 LAB — LIPID PANEL
Cholesterol: 150 (ref 0–200)
HDL: 51 (ref 35–70)
LDL Cholesterol: 82
Triglycerides: 86 (ref 40–160)

## 2022-09-23 LAB — CBC AND DIFFERENTIAL
HCT: 37 (ref 36–46)
Hemoglobin: 12.3 (ref 12.0–16.0)
Platelets: 327 10*3/uL (ref 150–400)
WBC: 7.1

## 2022-09-23 LAB — HEPATIC FUNCTION PANEL
ALT: 16 U/L (ref 7–35)
AST: 20 (ref 13–35)
Alkaline Phosphatase: 58 (ref 25–125)

## 2022-09-23 LAB — BASIC METABOLIC PANEL
CO2: 30 — AB (ref 13–22)
Chloride: 100 (ref 99–108)
Glucose: 84
Potassium: 3.5 mEq/L (ref 3.5–5.1)

## 2022-09-23 LAB — HEMOGLOBIN A1C: Hemoglobin A1C: 5.6

## 2022-09-23 LAB — CBC: RBC: 4.32 (ref 3.87–5.11)

## 2022-09-23 LAB — COMPREHENSIVE METABOLIC PANEL: Calcium: 9.4 (ref 8.7–10.7)

## 2022-09-28 ENCOUNTER — Other Ambulatory Visit (HOSPITAL_COMMUNITY): Payer: Self-pay

## 2022-09-28 ENCOUNTER — Encounter: Payer: Self-pay | Admitting: Family Medicine

## 2022-10-06 ENCOUNTER — Other Ambulatory Visit (INDEPENDENT_AMBULATORY_CARE_PROVIDER_SITE_OTHER): Payer: Self-pay | Admitting: Family Medicine

## 2022-10-06 ENCOUNTER — Other Ambulatory Visit (HOSPITAL_COMMUNITY): Payer: Self-pay

## 2022-10-06 DIAGNOSIS — E1169 Type 2 diabetes mellitus with other specified complication: Secondary | ICD-10-CM

## 2022-10-06 MED ORDER — TIRZEPATIDE 15 MG/0.5ML ~~LOC~~ SOAJ
15.0000 mg | SUBCUTANEOUS | 0 refills | Status: DC
Start: 2022-10-06 — End: 2022-10-15
  Filled 2022-10-06: qty 2, 28d supply, fill #0

## 2022-10-08 ENCOUNTER — Other Ambulatory Visit (HOSPITAL_COMMUNITY): Payer: Self-pay

## 2022-10-11 LAB — SIGNATERA
SIGNATERA MTM READOUT: 0.15 MTM/ml — AB
SIGNATERA TEST RESULT: POSITIVE — AB

## 2022-10-12 ENCOUNTER — Other Ambulatory Visit: Payer: Self-pay

## 2022-10-12 ENCOUNTER — Other Ambulatory Visit (HOSPITAL_COMMUNITY): Payer: Self-pay

## 2022-10-12 ENCOUNTER — Encounter: Payer: Self-pay | Admitting: Hematology and Oncology

## 2022-10-12 DIAGNOSIS — C50412 Malignant neoplasm of upper-outer quadrant of left female breast: Secondary | ICD-10-CM

## 2022-10-12 NOTE — Progress Notes (Signed)
Positive Signatera result received via fax. Per MD, CT CAP and whole body bone scan ordered. Scans scheduled for 10/21/22 at [facility]. Follow up MD visit scheduled for 10/27/22. Pt verbalized understanding.

## 2022-10-13 NOTE — Progress Notes (Deleted)
TeleHealth Visit:  This visit was completed with telemedicine (audio/video) technology. Elizabeth Cummings has verbally consented to this TeleHealth visit. The patient is located at home, the provider is located at home. The participants in this visit include the listed provider and patient. The visit was conducted today via MyChart video.  OBESITY Elizabeth Cummings is here to discuss her progress with her obesity treatment plan along with follow-up of her obesity related diagnoses.    Today's visit was # 22  Starting weight: 231 lbs Starting date: 11/14/20 Weight reported at last virtual office visit: 185.8 lbs on 08/19/22 Today's reported weight (***): {dwwweightreported:29243} Weight at clinic on ***: *** lbs Total weight loss: *** lbs Weight change since last visit: *** lbs  Today's vital per pt: T- *** BP- *** HR- *** R- *** O2 sat on RA- ***   Goal weight 174 (29 BMI).  Nutrition Plan: practicing portion control and making smarter food choices, such as increasing vegetables and decreasing simple carbohydrates - ***% adherence.  Current exercise: {exercise types:16438} taking steps rather than elevator and walking 14 minutes/day. 10000-12000 steps/day on workday   Interim History:  ***  {dwwnutritionassessment:28854}  Eating all of the prescribed protein: {yes***/no:17258} Skipping meals: {dwwyes:29172} Drinking adequate water: {dwwyes:29172} Drinking sugar sweetened beverages: {dwwyes:29172} Hunger controlled: {EWCONTROLASSESSMENT:24261}. Cravings controlled:  {EWCONTROLASSESSMENT:24261}.  Journaling Consistently:  {dwwyes:29172} Meeting protein goals:  {dwwyes:29172} Meeting calorie goals:  {dwwyes:29172}  Pharmacotherapy: Elizabeth Cummings is on {dwwglp:29109}. Adverse side effects: {dwwse:29122} Hunger is {EWCONTROLASSESSMENT:24261}.  Assessment/Plan:  1.  Malignant neoplasm of upper outer quadrant of left breast estrogen receptor positive Had recent positive result of  Signatera test (test for DNA fragments of tumor).  Scheduled for full body scan this month.  Plan: Follow-up with oncology as directed.  2. ***  3. ***  {dwwmorbid:29108::"Morbid Obesity"}: Current BMI ***  Pharmacotherapy Plan {dwwmed:29123} {dwwglp:29109}.   Elizabeth Cummings {CHL AMB IS/IS NOT:210130109} currently in the action stage of change. As such, her goal is to {MWMwtloss#1:210800005}.  She has agreed to {dwwsldiets:29085}.  Exercise goals: {MWM EXERCISE RECS:23473}  Behavioral modification strategies: {dwwslwtlossstrategies:29088}.  Elizabeth Cummings has agreed to follow-up with our clinic in {NUMBER 1-10:22536} {dwwfutime:29619}  No orders of the defined types were placed in this encounter.   There are no discontinued medications.   No orders of the defined types were placed in this encounter.     Objective:   VITALS: Per patient if applicable, see vitals. GENERAL: Alert and in no acute distress. CARDIOPULMONARY: No increased WOB. Speaking in clear sentences.  PSYCH: Pleasant and cooperative. Speech normal rate and rhythm. Affect is appropriate. Insight and judgement are appropriate. Attention is focused, linear, and appropriate.  NEURO: Oriented as arrived to appointment on time with no prompting.   Attestation Statements:   Reviewed by clinician on day of visit: allergies, medications, problem list, medical history, surgical history, family history, social history, and previous encounter notes.  ***(delete if time-based billing not used) Time spent on visit including the items listed below was *** minutes.  -preparing to see the patient (e.g., review of tests, history, previous notes) -obtaining and/or reviewing separately obtained history -counseling and educating the patient/family/caregiver -documenting clinical information in the electronic or other health record -ordering medications, tests, or procedures -independently interpreting results and communicating results  to the patient/ family/caregiver -referring and communicating with other health care professionals  -care coordination    This was prepared with the assistance of Dragon [employer].  Occasional wrong-word or sound-a-like substitutions may have occurred due to the inherent limitations of voice recognition  software.

## 2022-10-14 ENCOUNTER — Ambulatory Visit (HOSPITAL_COMMUNITY)
Admission: RE | Admit: 2022-10-14 | Discharge: 2022-10-14 | Disposition: A | Discharge status: Home / Self Care | Payer: 59 | Source: Ambulatory Visit | Attending: Hematology and Oncology | Admitting: Hematology and Oncology

## 2022-10-14 ENCOUNTER — Telehealth (INDEPENDENT_AMBULATORY_CARE_PROVIDER_SITE_OTHER): Payer: 59 | Admitting: Family Medicine

## 2022-10-14 DIAGNOSIS — C50912 Malignant neoplasm of unspecified site of left female breast: Secondary | ICD-10-CM | POA: Diagnosis not present

## 2022-10-14 DIAGNOSIS — C50412 Malignant neoplasm of upper-outer quadrant of left female breast: Secondary | ICD-10-CM | POA: Insufficient documentation

## 2022-10-14 DIAGNOSIS — C50919 Malignant neoplasm of unspecified site of unspecified female breast: Secondary | ICD-10-CM | POA: Diagnosis not present

## 2022-10-14 DIAGNOSIS — Z170 Estrogen receptor positive status [ER+]: Secondary | ICD-10-CM | POA: Diagnosis not present

## 2022-10-14 DIAGNOSIS — M129 Arthropathy, unspecified: Secondary | ICD-10-CM | POA: Diagnosis not present

## 2022-10-14 DIAGNOSIS — N200 Calculus of kidney: Secondary | ICD-10-CM | POA: Diagnosis not present

## 2022-10-14 MED ORDER — IOHEXOL 300 MG/ML  SOLN
100.0000 mL | Freq: Once | INTRAMUSCULAR | Status: AC | PRN
  Administered 2022-10-14: 100 mL via INTRAVENOUS

## 2022-10-14 MED ORDER — TECHNETIUM TC 99M MEDRONATE IV KIT
20.0000 | PACK | Freq: Once | INTRAVENOUS | Status: AC
  Administered 2022-10-14: 20.5 via INTRAVENOUS

## 2022-10-14 MED ORDER — SODIUM CHLORIDE (PF) 0.9 % IJ SOLN
INTRAMUSCULAR | Status: AC
  Filled 2022-10-14: qty 50

## 2022-10-14 NOTE — Progress Notes (Signed)
TeleHealth Visit:  This visit was completed with telemedicine (audio/video) technology. Elizabeth Cummings has verbally consented to this TeleHealth visit. The patient is located at home, the provider is located at home. The participants in this visit include the listed provider and patient. The visit was conducted today via MyChart video.  OBESITY Elizabeth Cummings is here to discuss her progress with her obesity treatment plan along with follow-up of her obesity related diagnoses.    Today's visit was # 22  Starting weight: 231 lbs Starting date: 11/14/20 Weight reported at last virtual office visit: 185.8 lbs on 08/19/22 Today's reported weight (10/15/22): 185.2 lbs Total weight loss: 46 lbs Weight change since last visit: 0 lbs  Today's vital per pt: T- 97.2 BP- 118/83 HR- 75 R- 18 O2 sat on RA- 99   Goal weight 174 (29 BMI).  Nutrition Plan: practicing portion control and making smarter food choices, such as increasing vegetables and decreasing simple carbohydrates   Current exercise:  taking steps rather than elevator and walking 14 minutes/day. 10000-12000 steps/day on workday  Interim History:  Her dad had a brain bleed in mid June and was in ICU. He has A fib. Also has had positive Signatera test as discussed below. Feels like she is stress eating some. Protein intake- 50 gms per day. Does not have a protein shake daily. Breakfast- protein oatmeal or shake. Eating some junk food.  Assessment/Plan:  1.  Malignant neoplasm of upper outer quadrant of left breast in female, estrogen receptor positive. Has been having disease monitoring using the Signatera test which detects fragments of tumor DNA.  Signatera came back with positive results last week.  She had a full body bone scan and CT of pelvis chest and abdomen yesterday.  Bone scan results not released. CT noted: 2. Extensive skin thickening of the left breast, nonspecific but concerning for malignant involvement of the  skin. Has appointment with Dr. Pamelia Hoit her oncologist next Tuesday.  Plan: Follow-up with Dr. Pamelia Hoit as scheduled. We discussed her CT scan results.  Advised since I am not an oncology nurse practitioner I cannot interpret or predict what the plan of action will be.  2. Type 2 Diabetes Mellitus with other specified complication, without long-term current use of insulin HgbA1c is at goal. Last A1c was 5.6 CBGs: Not checking Episodes of hypoglycemia: no Medication(s): Mounjaro 15 mg SQ weekly  Lab Results  Component Value Date   HGBA1C 5.6 09/23/2022   HGBA1C 6.2 (H) 06/25/2022   HGBA1C 6.1 (H) 12/23/2021   Lab Results  Component Value Date   LDLCALC 82 09/23/2022   CREATININE 0.73 06/25/2022   No results found for: "GFR"  Plan: Continue and refill Mounjaro 15 mg SQ weekly   3. Generalized Obesity: Current BMI 31 Elizabeth Cummings is currently in the action stage of change. As such, her goal is to continue with weight loss efforts.  She has agreed to practicing portion control and making smarter food choices, such as increasing vegetables and decreasing simple carbohydrates.  Have one protein shake daily.  Exercise goals: as is  Behavioral modification strategies: increasing lean protein intake.  Dajha has agreed to follow-up with our clinic in 4 weeks.  No orders of the defined types were placed in this encounter.   Medications Discontinued During This Encounter  Medication Reason   tirzepatide Greggory Keen) 15 MG/0.5ML Pen Reorder     Meds ordered this encounter  Medications   tirzepatide (MOUNJARO) 15 MG/0.5ML Pen    Sig: Inject 15 mg into the  skin once a week.    Dispense:  6 mL    Refill:  0    Order Specific Question:   Supervising Provider    Answer:   Glennis Brink [[id number]]      Objective:   VITALS: Per patient if applicable, see vitals. GENERAL: Alert and in no acute distress. CARDIOPULMONARY: No increased WOB. Speaking in clear sentences.  PSYCH: Pleasant  and cooperative. Speech normal rate and rhythm. Affect is appropriate. Insight and judgement are appropriate. Attention is focused, linear, and appropriate.  NEURO: Oriented as arrived to appointment on time with no prompting.   Attestation Statements:   Reviewed by clinician on day of visit: allergies, medications, problem list, medical history, surgical history, family history, social history, and previous encounter notes.   This was prepared with the assistance of Dragon [employer].  Occasional wrong-word or sound-a-like substitutions may have occurred due to the inherent limitations of voice recognition software.

## 2022-10-15 ENCOUNTER — Telehealth (INDEPENDENT_AMBULATORY_CARE_PROVIDER_SITE_OTHER): Payer: 59 | Admitting: Family Medicine

## 2022-10-15 ENCOUNTER — Encounter (INDEPENDENT_AMBULATORY_CARE_PROVIDER_SITE_OTHER): Payer: Self-pay | Admitting: Family Medicine

## 2022-10-15 ENCOUNTER — Encounter: Payer: Self-pay | Admitting: Hematology and Oncology

## 2022-10-15 ENCOUNTER — Other Ambulatory Visit (HOSPITAL_COMMUNITY): Payer: Self-pay

## 2022-10-15 VITALS — Ht 64.0 in | Wt 185.0 lb

## 2022-10-15 DIAGNOSIS — Z7985 Long-term (current) use of injectable non-insulin antidiabetic drugs: Secondary | ICD-10-CM

## 2022-10-15 DIAGNOSIS — Z170 Estrogen receptor positive status [ER+]: Secondary | ICD-10-CM

## 2022-10-15 DIAGNOSIS — E669 Obesity, unspecified: Secondary | ICD-10-CM | POA: Diagnosis not present

## 2022-10-15 DIAGNOSIS — C50412 Malignant neoplasm of upper-outer quadrant of left female breast: Secondary | ICD-10-CM | POA: Diagnosis not present

## 2022-10-15 DIAGNOSIS — Z6831 Body mass index (BMI) 31.0-31.9, adult: Secondary | ICD-10-CM

## 2022-10-15 DIAGNOSIS — E1169 Type 2 diabetes mellitus with other specified complication: Secondary | ICD-10-CM | POA: Diagnosis not present

## 2022-10-15 MED ORDER — TIRZEPATIDE 15 MG/0.5ML ~~LOC~~ SOAJ
15.0000 mg | SUBCUTANEOUS | 0 refills | Status: DC
Start: 2022-10-15 — End: 2022-11-12
  Filled 2022-10-15: qty 6, 84d supply, fill #0
  Filled 2022-10-29: qty 2, 28d supply, fill #0

## 2022-10-18 NOTE — Progress Notes (Signed)
Patient Care Team: Lupita Raider, MD as PCP - General (Family Medicine) Bensimhon, Bevelyn Buckles, MD as Consulting Physician (Cardiology) Eileen Stanford, MD as Referring Physician (Allergy and Immunology) Serena Croissant, MD as Consulting Physician (Hematology and Oncology) Lonie Peak, MD as Attending Physician (Radiation Oncology) Emelia Loron, MD as Consulting Physician (General Surgery)  DIAGNOSIS:  Encounter Diagnosis  Name Primary?   Malignant neoplasm of upper-outer quadrant of left breast in female, estrogen receptor positive (HCC) Yes    SUMMARY OF ONCOLOGIC HISTORY: Oncology History  Malignant neoplasm of upper-outer quadrant of left breast in female, estrogen receptor positive (HCC)  06/27/2021 Initial Diagnosis   Screening mammogram detected left breast asymmetry and distortion.  No ultrasound correlate.  Axilla negative, stereotactic biopsy showed grade 1 invasive lobular cancer with LCIS, negative for lymphovascular invasion, ER 90%, PR 80%, Ki-67 4%, HER2 equivocal by IHC, FISH negative, ratio 1.21   07/10/2021 Cancer Staging   Staging form: Breast, AJCC 8th Edition - Clinical: Stage Unknown (cTX, cN0, cM0, G1, ER+, PR+, HER2-) - Signed by Serena Croissant, MD on 07/10/2021 Stage prefix: Initial diagnosis Histologic grading system: 3 grade system    Genetic Testing   Negative genetic testing. No pathogenic variants identified on the [facility] BRCAPlus+ CancerNext-Expanded+RNA panel. VUS in KIF1B called c.[id number]>G identified. The report date is 07/25/2021.  The CancerNext-Expanded + RNAinsight gene panel offered by [employer] and includes sequencing and rearrangement analysis for the following 77 genes: IP, ALK, APC*, ATM*, AXIN2, BAP1, BARD1, BLM, BMPR1A, BRCA1*, BRCA2*, BRIP1*, CDC73, CDH1*,CDK4, CDKN1B, CDKN2A, CHEK2*, CTNNA1, DICER1, FANCC, FH, FLCN, GALNT12, KIF1B, LZTR1, MAX, MEN1, MET, MLH1*, MSH2*, MSH3, MSH6*, MUTYH*, NBN, NF1*, NF2, NTHL1, PALB2*, PHOX2B, PMS2*, POT1,  PRKAR1A, PTCH1, PTEN*, RAD51C*, RAD51D*,RB1, RECQL, RET, SDHA, SDHAF2, SDHB, SDHC, SDHD, SMAD4, SMARCA4, SMARCB1, SMARCE1, STK11, SUFU, TMEM127, TP53*,TSC1, TSC2, VHL and XRCC2 (sequencing and deletion/duplication); EGFR, EGLN1, HOXB13, KIT, MITF, PDGFRA, POLD1 and POLE (sequencing only); EPCAM and GREM1 (deletion/duplication only).   07/28/2021 Surgery   07/28/2021: Left lumpectomy: Grade 2 ILC 3.5 cm with LCIS, intraductal papilloma with UBH, margins negative, 1 lymph node with isolated tumor cells, ER 90%, PR 80%, HER2 negative, Ki-67 4%   08/05/2021 Cancer Staging   Staging form: Breast, AJCC 8th Edition - Pathologic: Stage IA (pT2, pN0(i+), cM0, G2, ER+, PR+, HER2-) - Signed by Serena Croissant, MD on 08/05/2021 Multigene prognostic tests performed: Oncotype DX Histologic grading system: 3 grade system   08/12/2021 Oncotype testing   Oncotype score 14: Distant recurrence at 9 years: 4%   09/11/2021 - 10/08/2021 Radiation Therapy   Site Technique Total Dose (Gy) Dose per Fx (Gy) Completed Fx Beam Energies  Breast, Left: Breast_L_Axilla 3D 40.05/40.05 2.67 15/15 10X  Breast, Left: Breast_L_Bst 3D 10/10 2 5/5 6X, 10X     10/07/2021 -  Anti-estrogen oral therapy   20 MG Tamoxifen x 10 years     CHIEF COMPLIANT: Left breast cancer surveillance on tamoxifen therapy/Scans   INTERVAL HISTORY: Elizabeth Cummings is a 47-year with above-mentioned history of left breast cancer. She presents to the clinic today for a follow-up. Pt denies any pain. She had some spotting early part of the month. She has no new concerns to report to the clinic today.  Because patient had positive Signatera testing we performed CT chest abdomen pelvis and bone scan.  She is here today to discuss those results.  She has no symptoms or concerns.  She has lost about 45 pounds weight by going to the healthy weight and wellness  clinic.  She feels absolutely great.   ALLERGIES:  is allergic to gluten meal and dilaudid [hydromorphone  hcl].  MEDICATIONS:  Current Outpatient Medications  Medication Sig Dispense Refill   azelastine (ASTELIN) 0.1 % nasal spray Place 1-2 sprays into both nostrils 2 (two) times daily. 30 mL 5   calcium-vitamin D (OSCAL WITH D) 500-200 MG-UNIT per tablet Take 1 tablet by mouth daily.     Cholecalciferol (VITAMIN D) 50 MCG (2000 UT) CAPS Take 1 capsule (2,000 Units total) by mouth daily. 30 capsule 0   ciprofloxacin (CIPRO) 500 MG tablet Take 1 tablet (500 mg total) by mouth every 12 (twelve) hours. 30 tablet 0   [employer] FIBER GUMMIES PO Take 2 Units by mouth daily.     fexofenadine (ALLEGRA) 180 MG tablet Take 1 tablet Orally Once a day in the morning. (please fill with levocetirizine) 90 tablet 3   fluticasone (FLONASE) 50 MCG/ACT nasal spray Place 2 sprays into both nostrils daily. 16 g 6   glucose blood (FREESTYLE TEST STRIPS) test strip Use as instructed test 2 times daily as directed 300 each 1   hydrochlorothiazide (HYDRODIURIL) 25 MG tablet Take 1 tablet (25 mg total) by mouth daily for edema 90 tablet 2   Lancets (FREESTYLE) lancets Use as instructed, test 2 times daily  as directed 300 each 1   levocetirizine (XYZAL) 5 MG tablet Take 1 tablet (5 mg total) by mouth every evening. 90 tablet 3   levonorgestrel (MIRENA, 52 MG,) 20 MCG/DAY IUD 1 Intra Uterine Device by Intrauterine route once.     Multiple Vitamin (MULTIVITAMIN) tablet Take 1 tablet by mouth daily.     pravastatin (PRAVACHOL) 20 MG tablet Take 1 tablet (20 mg total) by mouth daily. 90 tablet 1   Probiotic Product (DIGESTIVE ADV DIGESTIVE/IMMUNE PO) Take 1 Units by mouth daily.     tamoxifen (NOLVADEX) 20 MG tablet Take 1 tablet (20 mg total) by mouth daily. 90 tablet 3   tirzepatide (MOUNJARO) 15 MG/0.5ML Pen Inject 15 mg into the skin once a week. 6 mL 0   pravastatin (PRAVACHOL) 20 MG tablet Take 1 tablet (20 mg total) by mouth 3 times a week on Monday, Wednesday, and Friday. 90 tablet 0   No current facility-administered  medications for this visit.    PHYSICAL EXAMINATION: ECOG PERFORMANCE STATUS: 0 - Asymptomatic  Vitals:   10/20/22 0815  BP: 128/83  Pulse: 92  Resp: 18  Temp: 97.7 F (36.5 C)  SpO2: 97%   Filed Weights   10/20/22 0815  Weight: 186 lb 9.6 oz (84.6 kg)      LABORATORY DATA:  I have reviewed the data as listed    Latest Ref Rng & Units 09/23/2022   12:00 AM 07/01/2022    2:28 PM 06/25/2022   10:30 AM  CMP  Glucose 70 - 99 mg/dL   725   BUN 6 - 20 mg/dL   12   Creatinine 3.66 - 1.00 mg/dL   4.40   Sodium 347 - 425 mmol/L   140   Potassium 3.5 - 5.1 mEq/L 3.5  3.5  3.1   Chloride 99 - 108 100   102   CO2 13 - 22 30   26    Calcium 8.7 - 10.7 9.4   9.7   Total Protein 6.5 - 8.1 g/dL   8.1   Total Bilirubin 0.3 - 1.2 mg/dL   0.7   Alkaline Phos 25 - 125 58   66  AST 13 - 35 20   23   ALT 7 - 35 U/L 16   20     Lab Results  Component Value Date   WBC 7.1 09/23/2022   HGB 12.3 09/23/2022   HCT 37 09/23/2022   MCV 85 12/23/2021   PLT 327 09/23/2022   NEUTROABS 4.9 12/23/2021    ASSESSMENT & PLAN:  Malignant neoplasm of upper-outer quadrant of left breast in female, estrogen receptor positive (HCC) 07/28/2021: Left lumpectomy: Grade 2 ILC 3.5 cm with LCIS, intraductal papilloma with UBH, margins negative, 1 lymph node with isolated tumor cells, ER 90%, PR 80%, HER2 negative, Ki-67 4% Oncotype score 14: Distant recurrence at 9 years: 4% 09/11/2021-10/08/2021 Adjuvant radiation:    Current treatment: adjuvant antiestrogen therapy with tamoxifen 20 mg daily x10 years (she will start at 10 mg a day for the first month and then increase it to 20 if she tolerates it well), plan to switch to aromatase inhibitor once she is in menopause    Tamoxifen toxicities: Mild hot flashes.   Weight: Patient goes to the healthy weight and wellness clinic and has lost 45 pounds with Monjuro. Patient works as a Engineer, civil (consulting) in the [employer].   Breast cancer surveillance: Mammogram 05/22/2022:  Benign breast density category B Breast exam 06/25/2022: Benign CT CAP 10/15/2022: Skin thickening left breast nonspecific, no evidence of metastatic disease Bone scan 10/14/2022: Results are pending Breast MRI scheduled for 11/26/2022 Signatera: Previously was negative, slight positivity in July 2024: Scans negative, repeat CT chest abdomen pelvis in 6 months. Patient is still premenopausal: June 2024 estradiol 6.8, FSH 25.  She gets every 6 months menopause labs.  If the [facility] crosses 35 we can switch her from tamoxifen to anastrozole.   We will alternate mammograms and breast MRIs.  I will call her with the result of the bone scan. Return to clinic in 6 months for CT CAP and follow-up    Orders Placed This Encounter  Procedures   CT CHEST ABDOMEN PELVIS W CONTRAST    Standing Status:   Future    Standing Expiration Date:   10/20/2023    Order Specific Question:   If indicated for the ordered procedure, I authorize the administration of contrast media per Radiology protocol    Answer:   Yes    Order Specific Question:   Does the patient have a contrast media/X-ray dye allergy?    Answer:   No    Order Specific Question:   Is patient pregnant?    Answer:   No    Order Specific Question:   Preferred imaging location?    Answer:   [facility]    Order Specific Question:   If indicated for the ordered procedure, I authorize the administration of oral contrast media per Radiology protocol    Answer:   Yes   The patient has a good understanding of the overall plan. she agrees with it. she will call with any problems that may develop before the next visit here. Total time spent: 30 mins including face to face time and time spent for planning, charting and co-ordination of care   Tamsen Meek, MD 10/20/22    I Janan Ridge am acting as a Neurosurgeon for [employer]  I have reviewed the above documentation for accuracy and completeness, and I agree with the above.

## 2022-10-19 ENCOUNTER — Encounter: Payer: Self-pay | Admitting: Hematology and Oncology

## 2022-10-20 ENCOUNTER — Other Ambulatory Visit: Payer: Self-pay

## 2022-10-20 ENCOUNTER — Inpatient Hospital Stay: Payer: 59 | Attending: Hematology and Oncology | Admitting: Hematology and Oncology

## 2022-10-20 VITALS — BP 128/83 | HR 92 | Temp 97.7°F | Resp 18 | Ht 64.0 in | Wt 186.6 lb

## 2022-10-20 DIAGNOSIS — Z79810 Long term (current) use of selective estrogen receptor modulators (SERMs): Secondary | ICD-10-CM | POA: Diagnosis not present

## 2022-10-20 DIAGNOSIS — C50412 Malignant neoplasm of upper-outer quadrant of left female breast: Secondary | ICD-10-CM | POA: Insufficient documentation

## 2022-10-20 DIAGNOSIS — Z923 Personal history of irradiation: Secondary | ICD-10-CM | POA: Insufficient documentation

## 2022-10-20 DIAGNOSIS — Z170 Estrogen receptor positive status [ER+]: Secondary | ICD-10-CM | POA: Diagnosis not present

## 2022-10-20 NOTE — Assessment & Plan Note (Addendum)
07/28/2021: Left lumpectomy: Grade 2 ILC 3.5 cm with LCIS, intraductal papilloma with UBH, margins negative, 1 lymph node with isolated tumor cells, ER 90%, PR 80%, HER2 negative, Ki-67 4% Oncotype score 14: Distant recurrence at 9 years: 4% 09/11/2021-10/08/2021 Adjuvant radiation:    Current treatment: adjuvant antiestrogen therapy with tamoxifen 20 mg daily x10 years (she will start at 10 mg a day for the first month and then increase it to 20 if she tolerates it well), plan to switch to aromatase inhibitor once she is in menopause    Tamoxifen toxicities: Mild hot flashes.   Weight: Patient goes to the healthy weight and wellness clinic and has lost 45 pounds with Monjuro. Patient works as a Engineer, civil (consulting) in the [employer].   Breast cancer surveillance: Mammogram 05/22/2022: Benign breast density category B Breast exam 06/25/2022: Benign CT CAP 10/15/2022: Skin thickening left breast nonspecific, no evidence of metastatic disease Bone scan 10/14/2022: Results are pending Breast MRI scheduled for 11/26/2022 [facility]: Previously was negative, slight positivity in July 2024: Scans negative, repeat CT chest abdomen pelvis in 6 months. Patient is still premenopausal: June 2024 estradiol 6.8, FSH 25.  She gets every 6 months menopause labs.  If the [facility] crosses 35 we can switch her from tamoxifen to anastrozole.   We will alternate mammograms and breast MRIs.  I will call her with the result of the bone scan. Return to clinic in 6 months for CT CAP and follow-up

## 2022-10-21 ENCOUNTER — Ambulatory Visit (HOSPITAL_COMMUNITY): Payer: 59

## 2022-10-21 ENCOUNTER — Other Ambulatory Visit (HOSPITAL_COMMUNITY): Payer: 59

## 2022-10-21 ENCOUNTER — Telehealth: Payer: Self-pay | Admitting: Hematology and Oncology

## 2022-10-21 NOTE — Telephone Encounter (Signed)
Scheduled appointment per 7/30 los. Patient is aware of the made appointment.

## 2022-10-27 ENCOUNTER — Ambulatory Visit: Payer: 59 | Admitting: Hematology and Oncology

## 2022-10-29 ENCOUNTER — Other Ambulatory Visit (HOSPITAL_COMMUNITY): Payer: Self-pay

## 2022-10-29 ENCOUNTER — Other Ambulatory Visit: Payer: Self-pay

## 2022-10-29 ENCOUNTER — Other Ambulatory Visit: Payer: Self-pay | Admitting: Hematology and Oncology

## 2022-10-29 MED ORDER — TAMOXIFEN CITRATE 20 MG PO TABS
20.0000 mg | ORAL_TABLET | Freq: Every day | ORAL | 3 refills | Status: DC
  Filled 2022-10-29: qty 90, 90d supply, fill #0
  Filled 2023-02-15: qty 90, 90d supply, fill #1
  Filled 2023-05-17: qty 90, 90d supply, fill #2
  Filled 2023-08-11: qty 90, 90d supply, fill #3

## 2022-11-11 NOTE — Progress Notes (Unsigned)
TeleHealth Visit:  This visit was completed with telemedicine (audio/video) technology. Elizabeth Cummings has verbally consented to this TeleHealth visit. The patient is located at home, the provider is located at home. The participants in this visit include the listed provider and patient. The visit was conducted today via MyChart video.  OBESITY Elizabeth Cummings is here to discuss her progress with her obesity treatment plan along with follow-up of her obesity related diagnoses.    Today's visit was # 23  Starting weight: 231 lbs Starting date: 11/14/20 Weight reported at last virtual office visit: 10/15/22 lbs on 185.2 Today's reported weight (11/12/22):  184.4 lbs Total weight loss: 47 lbs Weight change since last visit: -1 lbs  Goal weight 174 (29 BMI).   Nutrition Plan: practicing portion control and making smarter food choices, such as increasing vegetables and decreasing simple carbohydrates    Current exercise:  taking steps rather than elevator and walking 14 minutes/day. 10000-12000 steps/day on workday  Interim History:  She just got back from a beach trip but lost 1 lbs. She is struggling with getting in protein and averages about 50 gms daily. She is drinking a protein shake daily. She is focusing on getting in more protein.  Today she has had 40 gms of protein already. She has not been using her hand weights.  Assessment/Plan:  1. Type 2 Diabetes Mellitus with other specified complication, without long-term current use of insulin HgbA1c is at goal. Last A1c was 5.6 Medication(s): Mounjaro 15 mg SQ weekly  Lab Results  Component Value Date   HGBA1C 5.6 09/23/2022   HGBA1C 6.2 (H) 06/25/2022   HGBA1C 6.1 (H) 12/23/2021   Lab Results  Component Value Date   LDLCALC 82 09/23/2022   CREATININE 0.73 06/25/2022   No results found for: "GFR"  Plan: Continue and refill Mounjaro 15 mg SQ weekly  2. Hyperlipidemia associated with type 2 diabetes LDL is a bit over goal  at 82.  Medication(s): pravachol 20 mg daily  Lab Results  Component Value Date   CHOL 150 09/23/2022   HDL 51 09/23/2022   LDLCALC 82 09/23/2022   TRIG 86 09/23/2022   CHOLHDL 3.0 06/25/2022   Lab Results  Component Value Date   ALT 16 09/23/2022   AST 20 09/23/2022   ALKPHOS 58 09/23/2022   BILITOT 0.7 06/25/2022   The 10-year ASCVD risk score (Arnett DK, et al., 2019) is: 1.6%*   Values used to calculate the score:     Age: 47 years     Sex: Female     Is Non-Hispanic African American: No     Diabetic: Yes     Tobacco smoker: No     Systolic Blood Pressure: 128 mmHg     Is BP treated: No     HDL Cholesterol: 51 mg/dL*     Total Cholesterol: 150 mg/dL*     * - Cholesterol units were assumed for this score calculation  Plan: Continue pravastatin 20 mg daily.  3. Generalized Obesity: Current BMI 36.5  Elizabeth Cummings is currently in the action stage of change. As such, her goal is to continue with weight loss efforts.  She has agreed to practicing portion control and making smarter food choices, such as increasing vegetables and decreasing simple carbohydrates.  Exercise goals: Use the stairs at work several times per day.  Add resistance training with hand weights 2-3 times per week.  Behavioral modification strategies: increasing lean protein intake, decreasing simple carbohydrates , and planning for success.  Elizabeth Cummings  has agreed to follow-up with our clinic in 6 weeks.  No orders of the defined types were placed in this encounter.   Medications Discontinued During This Encounter  Medication Reason   pravastatin (PRAVACHOL) 20 MG tablet    tirzepatide (MOUNJARO) 15 MG/0.5ML Pen Reorder     Meds ordered this encounter  Medications   tirzepatide (MOUNJARO) 15 MG/0.5ML Pen    Sig: Inject 15 mg into the skin once a week.    Dispense:  6 mL    Refill:  0    Order Specific Question:   Supervising Provider    Answer:   Glennis Brink [[id number]]      Objective:    VITALS: Per patient if applicable, see vitals. GENERAL: Alert and in no acute distress. CARDIOPULMONARY: No increased WOB. Speaking in clear sentences.  PSYCH: Pleasant and cooperative. Speech normal rate and rhythm. Affect is appropriate. Insight and judgement are appropriate. Attention is focused, linear, and appropriate.  NEURO: Oriented as arrived to appointment on time with no prompting.   Attestation Statements:   Reviewed by clinician on day of visit: allergies, medications, problem list, medical history, surgical history, family history, social history, and previous encounter notes.   This was prepared with the assistance of [employer].  Occasional wrong-word or sound-a-like substitutions may have occurred due to the inherent limitations of voice recognition software.

## 2022-11-12 ENCOUNTER — Telehealth (INDEPENDENT_AMBULATORY_CARE_PROVIDER_SITE_OTHER): Payer: 59 | Admitting: Family Medicine

## 2022-11-12 ENCOUNTER — Encounter (INDEPENDENT_AMBULATORY_CARE_PROVIDER_SITE_OTHER): Payer: Self-pay | Admitting: Family Medicine

## 2022-11-12 ENCOUNTER — Other Ambulatory Visit (HOSPITAL_COMMUNITY): Payer: Self-pay

## 2022-11-12 VITALS — Ht 64.0 in | Wt 184.0 lb

## 2022-11-12 DIAGNOSIS — E1169 Type 2 diabetes mellitus with other specified complication: Secondary | ICD-10-CM | POA: Diagnosis not present

## 2022-11-12 DIAGNOSIS — Z6831 Body mass index (BMI) 31.0-31.9, adult: Secondary | ICD-10-CM

## 2022-11-12 DIAGNOSIS — Z6836 Body mass index (BMI) 36.0-36.9, adult: Secondary | ICD-10-CM

## 2022-11-12 DIAGNOSIS — E669 Obesity, unspecified: Secondary | ICD-10-CM | POA: Diagnosis not present

## 2022-11-12 DIAGNOSIS — Z7985 Long-term (current) use of injectable non-insulin antidiabetic drugs: Secondary | ICD-10-CM

## 2022-11-12 DIAGNOSIS — E785 Hyperlipidemia, unspecified: Secondary | ICD-10-CM | POA: Diagnosis not present

## 2022-11-12 MED ORDER — TIRZEPATIDE 15 MG/0.5ML ~~LOC~~ SOAJ
15.0000 mg | SUBCUTANEOUS | 0 refills | Status: DC
Start: 2022-11-12 — End: 2023-02-12
  Filled 2022-11-12: qty 6, 84d supply, fill #0
  Filled 2022-11-26: qty 2, 28d supply, fill #0
  Filled 2022-12-22: qty 2, 28d supply, fill #1
  Filled 2023-01-17: qty 2, 28d supply, fill #2

## 2022-11-26 ENCOUNTER — Other Ambulatory Visit (HOSPITAL_COMMUNITY): Payer: Self-pay

## 2022-11-26 ENCOUNTER — Ambulatory Visit
Admission: RE | Admit: 2022-11-26 | Discharge: 2022-11-26 | Disposition: A | Discharge status: Home / Self Care | Payer: 59 | Source: Ambulatory Visit | Attending: Hematology and Oncology | Admitting: Hematology and Oncology

## 2022-11-26 DIAGNOSIS — C50412 Malignant neoplasm of upper-outer quadrant of left female breast: Secondary | ICD-10-CM

## 2022-11-26 DIAGNOSIS — Z853 Personal history of malignant neoplasm of breast: Secondary | ICD-10-CM | POA: Diagnosis not present

## 2022-11-26 DIAGNOSIS — R928 Other abnormal and inconclusive findings on diagnostic imaging of breast: Secondary | ICD-10-CM | POA: Diagnosis not present

## 2022-11-26 MED ORDER — GADOPICLENOL 0.5 MMOL/ML IV SOLN
8.0000 mL | Freq: Once | INTRAVENOUS | Status: AC | PRN
  Administered 2022-11-26: 8 mL via INTRAVENOUS

## 2022-11-27 ENCOUNTER — Other Ambulatory Visit: Payer: Self-pay | Admitting: Hematology and Oncology

## 2022-11-27 ENCOUNTER — Telehealth: Payer: Self-pay | Admitting: Hematology and Oncology

## 2022-11-27 DIAGNOSIS — R928 Other abnormal and inconclusive findings on diagnostic imaging of breast: Secondary | ICD-10-CM

## 2022-11-27 NOTE — Telephone Encounter (Signed)
I discussed with the patient that the result of the MRI showed 2 areas of concern most likely fat necrosis but radiology is recommending a biopsy then therefore I will request and biopsy appointment for her.  Patient understands this and she will move her appointment with Dr. Dwain Sarna by couple of weeks so that she will have the results of the biopsy ready by then.

## 2022-12-02 ENCOUNTER — Ambulatory Visit
Admission: RE | Admit: 2022-12-02 | Discharge: 2022-12-02 | Disposition: A | Discharge status: Home / Self Care | Payer: 59 | Source: Ambulatory Visit | Attending: Hematology and Oncology | Admitting: Hematology and Oncology

## 2022-12-02 ENCOUNTER — Ambulatory Visit
Admission: RE | Admit: 2022-12-02 | Discharge: 2022-12-02 | Disposition: A | Discharge status: Home / Self Care | Payer: 59 | Source: Ambulatory Visit | Attending: Hematology and Oncology

## 2022-12-02 DIAGNOSIS — R928 Other abnormal and inconclusive findings on diagnostic imaging of breast: Secondary | ICD-10-CM

## 2022-12-02 MED ORDER — GADOPICLENOL 0.5 MMOL/ML IV SOLN
8.0000 mL | Freq: Once | INTRAVENOUS | Status: AC | PRN
  Administered 2022-12-02: 8 mL via INTRAVENOUS

## 2022-12-03 LAB — SURGICAL PATHOLOGY

## 2022-12-09 ENCOUNTER — Other Ambulatory Visit (HOSPITAL_COMMUNITY): Payer: Self-pay

## 2022-12-09 DIAGNOSIS — N6082 Other benign mammary dysplasias of left breast: Secondary | ICD-10-CM | POA: Diagnosis not present

## 2022-12-09 DIAGNOSIS — C50412 Malignant neoplasm of upper-outer quadrant of left female breast: Secondary | ICD-10-CM | POA: Diagnosis not present

## 2022-12-09 DIAGNOSIS — Z170 Estrogen receptor positive status [ER+]: Secondary | ICD-10-CM | POA: Diagnosis not present

## 2022-12-21 ENCOUNTER — Other Ambulatory Visit (HOSPITAL_COMMUNITY): Payer: Self-pay

## 2022-12-21 DIAGNOSIS — C50912 Malignant neoplasm of unspecified site of left female breast: Secondary | ICD-10-CM | POA: Diagnosis not present

## 2022-12-22 ENCOUNTER — Other Ambulatory Visit (HOSPITAL_COMMUNITY): Payer: Self-pay

## 2022-12-22 MED ORDER — ACCU-CHEK SOFTCLIX LANCETS MISC
1.0000 | Freq: Every day | 3 refills | Status: AC
  Filled 2022-12-22 – 2022-12-23 (×2): qty 100, 90d supply, fill #0

## 2022-12-22 MED ORDER — FREESTYLE LITE TEST VI STRP
1.0000 | ORAL_STRIP | Freq: Every day | 3 refills | Status: AC
Start: 2022-12-22 — End: ?
  Filled 2022-12-22 – 2022-12-23 (×2): qty 100, 90d supply, fill #0

## 2022-12-22 MED ORDER — ACCU-CHEK GUIDE W/DEVICE KIT
1.0000 | PACK | 1 refills | Status: AC
  Filled 2022-12-22 – 2022-12-23 (×2): qty 1, 30d supply, fill #0

## 2022-12-23 ENCOUNTER — Other Ambulatory Visit (HOSPITAL_COMMUNITY): Payer: Self-pay

## 2022-12-23 DIAGNOSIS — E1169 Type 2 diabetes mellitus with other specified complication: Secondary | ICD-10-CM | POA: Diagnosis not present

## 2022-12-23 DIAGNOSIS — E559 Vitamin D deficiency, unspecified: Secondary | ICD-10-CM | POA: Diagnosis not present

## 2022-12-23 DIAGNOSIS — E7849 Other hyperlipidemia: Secondary | ICD-10-CM | POA: Diagnosis not present

## 2022-12-23 DIAGNOSIS — R002 Palpitations: Secondary | ICD-10-CM | POA: Diagnosis not present

## 2022-12-23 MED ORDER — MOUNJARO 15 MG/0.5ML ~~LOC~~ SOAJ
15.0000 mg | SUBCUTANEOUS | 0 refills | Status: DC
  Filled 2022-12-23 – 2023-02-15 (×2): qty 6, 84d supply, fill #0

## 2022-12-24 ENCOUNTER — Other Ambulatory Visit (HOSPITAL_COMMUNITY): Payer: Self-pay

## 2022-12-24 ENCOUNTER — Other Ambulatory Visit: Payer: Self-pay

## 2022-12-29 DIAGNOSIS — C50912 Malignant neoplasm of unspecified site of left female breast: Secondary | ICD-10-CM | POA: Diagnosis not present

## 2023-01-12 ENCOUNTER — Encounter: Payer: Self-pay | Admitting: Hematology and Oncology

## 2023-01-12 ENCOUNTER — Other Ambulatory Visit: Payer: Self-pay

## 2023-01-12 ENCOUNTER — Other Ambulatory Visit: Payer: Self-pay | Admitting: *Deleted

## 2023-01-12 DIAGNOSIS — Z170 Estrogen receptor positive status [ER+]: Secondary | ICD-10-CM

## 2023-01-12 NOTE — Progress Notes (Signed)
Signatera renewal orders placed.

## 2023-01-17 ENCOUNTER — Other Ambulatory Visit (HOSPITAL_COMMUNITY): Payer: Self-pay

## 2023-01-18 ENCOUNTER — Other Ambulatory Visit (HOSPITAL_COMMUNITY): Payer: Self-pay

## 2023-01-19 ENCOUNTER — Other Ambulatory Visit (HOSPITAL_COMMUNITY): Payer: Self-pay

## 2023-01-19 DIAGNOSIS — Z170 Estrogen receptor positive status [ER+]: Secondary | ICD-10-CM | POA: Diagnosis not present

## 2023-01-19 DIAGNOSIS — C50412 Malignant neoplasm of upper-outer quadrant of left female breast: Secondary | ICD-10-CM | POA: Diagnosis not present

## 2023-01-19 MED ORDER — PRAVASTATIN SODIUM 20 MG PO TABS
20.0000 mg | ORAL_TABLET | Freq: Every day | ORAL | 1 refills | Status: DC
  Filled 2023-01-19: qty 90, 90d supply, fill #0

## 2023-01-20 ENCOUNTER — Other Ambulatory Visit (HOSPITAL_COMMUNITY): Payer: Self-pay

## 2023-01-20 DIAGNOSIS — E7849 Other hyperlipidemia: Secondary | ICD-10-CM | POA: Diagnosis not present

## 2023-01-20 DIAGNOSIS — E66811 Obesity, class 1: Secondary | ICD-10-CM | POA: Diagnosis not present

## 2023-01-20 DIAGNOSIS — E1169 Type 2 diabetes mellitus with other specified complication: Secondary | ICD-10-CM | POA: Diagnosis not present

## 2023-01-20 DIAGNOSIS — E559 Vitamin D deficiency, unspecified: Secondary | ICD-10-CM | POA: Diagnosis not present

## 2023-01-20 DIAGNOSIS — Z6832 Body mass index (BMI) 32.0-32.9, adult: Secondary | ICD-10-CM | POA: Diagnosis not present

## 2023-01-20 MED ORDER — PRAVASTATIN SODIUM 40 MG PO TABS
40.0000 mg | ORAL_TABLET | Freq: Every day | ORAL | 0 refills | Status: DC
Start: 2023-01-20 — End: 2023-02-17
  Filled 2023-01-20: qty 90, 90d supply, fill #0

## 2023-01-21 ENCOUNTER — Other Ambulatory Visit (HOSPITAL_COMMUNITY): Payer: Self-pay

## 2023-01-28 LAB — SIGNATERA ONLY (NATERA MANAGED)
SIGNATERA MTM READOUT: 0.19 MTM/ml — AB
SIGNATERA TEST RESULT: POSITIVE — AB

## 2023-01-29 ENCOUNTER — Encounter: Payer: Self-pay | Admitting: *Deleted

## 2023-01-29 NOTE — Progress Notes (Signed)
Pts Signatera testing came back slightly more elevated.  Per MD pt needing CT CAP sooner than January.  Appts re- scheduled, message sent to PA team, pt notified and verbalized understanding.

## 2023-02-01 ENCOUNTER — Telehealth: Payer: Self-pay | Admitting: *Deleted

## 2023-02-01 NOTE — Telephone Encounter (Signed)
This RN spoke with pt - stating she is aware of the + Natara and is scheduled for a phone visit "but am concerned that I would like to be seen due to an area of my breast - that is different ".  Appt changed to in person visit per above,

## 2023-02-08 ENCOUNTER — Ambulatory Visit (HOSPITAL_COMMUNITY)
Admission: RE | Admit: 2023-02-08 | Discharge: 2023-02-08 | Disposition: A | Discharge status: Home / Self Care | Payer: 59 | Source: Ambulatory Visit | Attending: Hematology and Oncology | Admitting: Hematology and Oncology

## 2023-02-08 DIAGNOSIS — Z170 Estrogen receptor positive status [ER+]: Secondary | ICD-10-CM | POA: Diagnosis not present

## 2023-02-08 DIAGNOSIS — C50912 Malignant neoplasm of unspecified site of left female breast: Secondary | ICD-10-CM | POA: Diagnosis not present

## 2023-02-08 DIAGNOSIS — C50412 Malignant neoplasm of upper-outer quadrant of left female breast: Secondary | ICD-10-CM | POA: Diagnosis not present

## 2023-02-08 DIAGNOSIS — N200 Calculus of kidney: Secondary | ICD-10-CM | POA: Diagnosis not present

## 2023-02-08 DIAGNOSIS — Z9049 Acquired absence of other specified parts of digestive tract: Secondary | ICD-10-CM | POA: Diagnosis not present

## 2023-02-08 LAB — POCT I-STAT CREATININE: Creatinine, Ser: 0.9 mg/dL (ref 0.44–1.00)

## 2023-02-08 MED ORDER — IOHEXOL 300 MG/ML  SOLN
100.0000 mL | Freq: Once | INTRAMUSCULAR | Status: AC | PRN
  Administered 2023-02-08: 100 mL via INTRAVENOUS

## 2023-02-11 ENCOUNTER — Encounter: Payer: Self-pay | Admitting: Hematology and Oncology

## 2023-02-12 ENCOUNTER — Inpatient Hospital Stay: Payer: 59 | Attending: Hematology and Oncology | Admitting: Hematology and Oncology

## 2023-02-12 VITALS — BP 135/79 | HR 96 | Temp 98.0°F | Resp 18 | Ht 64.0 in | Wt 190.4 lb

## 2023-02-12 DIAGNOSIS — Z79810 Long term (current) use of selective estrogen receptor modulators (SERMs): Secondary | ICD-10-CM | POA: Insufficient documentation

## 2023-02-12 DIAGNOSIS — C50412 Malignant neoplasm of upper-outer quadrant of left female breast: Secondary | ICD-10-CM | POA: Diagnosis not present

## 2023-02-12 DIAGNOSIS — Z170 Estrogen receptor positive status [ER+]: Secondary | ICD-10-CM | POA: Diagnosis not present

## 2023-02-12 NOTE — Progress Notes (Signed)
Patient Care Team: Elizabeth Raider, MD as PCP - General (Family Medicine) Bensimhon, Bevelyn Buckles, MD as Consulting Physician (Cardiology) Eileen Stanford, MD as Referring Physician (Allergy and Immunology) Serena Croissant, MD as Consulting Physician (Hematology and Oncology) Lonie Peak, MD as Attending Physician (Radiation Oncology) Emelia Loron, MD as Consulting Physician (General Surgery)  DIAGNOSIS:  Encounter Diagnosis  Name Primary?   Malignant neoplasm of upper-outer quadrant of left breast in female, estrogen receptor positive (HCC) Yes    SUMMARY OF ONCOLOGIC HISTORY: Oncology History  Malignant neoplasm of upper-outer quadrant of left breast in female, estrogen receptor positive (HCC)  06/27/2021 Initial Diagnosis   Screening mammogram detected left breast asymmetry and distortion.  No ultrasound correlate.  Axilla negative, stereotactic biopsy showed grade 1 invasive lobular cancer with LCIS, negative for lymphovascular invasion, ER 90%, PR 80%, Ki-67 4%, HER2 equivocal by IHC, FISH negative, ratio 1.21   07/10/2021 Cancer Staging   Staging form: Breast, AJCC 8th Edition - Clinical: Stage Unknown (cTX, cN0, cM0, G1, ER+, PR+, HER2-) - Signed by Serena Croissant, MD on 07/10/2021 Stage prefix: Initial diagnosis Histologic grading system: 3 grade system    Genetic Testing   Negative genetic testing. No pathogenic variants identified on the [facility] BRCAPlus+ CancerNext-Expanded+RNA panel. VUS in KIF1B called c.[id number] identified. The report date is 07/25/2021.  The CancerNext-Expanded + RNAinsight gene panel offered by [employer] and includes sequencing and rearrangement analysis for the following 77 genes: IP, ALK, APC*, ATM*, AXIN2, BAP1, BARD1, BLM, BMPR1A, BRCA1*, BRCA2*, BRIP1*, CDC73, CDH1*,CDK4, CDKN1B, CDKN2A, CHEK2*, CTNNA1, DICER1, FANCC, FH, FLCN, GALNT12, KIF1B, LZTR1, MAX, MEN1, MET, MLH1*, MSH2*, MSH3, MSH6*, MUTYH*, NBN, NF1*, NF2, NTHL1, PALB2*, PHOX2B, PMS2*, POT1,  PRKAR1A, PTCH1, PTEN*, RAD51C*, RAD51D*,RB1, RECQL, RET, SDHA, SDHAF2, SDHB, SDHC, SDHD, SMAD4, SMARCA4, SMARCB1, SMARCE1, STK11, SUFU, TMEM127, TP53*,TSC1, TSC2, VHL and XRCC2 (sequencing and deletion/duplication); EGFR, EGLN1, HOXB13, KIT, MITF, PDGFRA, POLD1 and POLE (sequencing only); EPCAM and GREM1 (deletion/duplication only).   07/28/2021 Surgery   07/28/2021: Left lumpectomy: Grade 2 ILC 3.5 cm with LCIS, intraductal papilloma with UBH, margins negative, 1 lymph node with isolated tumor cells, ER 90%, PR 80%, HER2 negative, Ki-67 4%   08/05/2021 Cancer Staging   Staging form: Breast, AJCC 8th Edition - Pathologic: Stage IA (pT2, pN0(i+), cM0, G2, ER+, PR+, HER2-) - Signed by Serena Croissant, MD on 08/05/2021 Multigene prognostic tests performed: Oncotype DX Histologic grading system: 3 grade system   08/12/2021 Oncotype testing   Oncotype score 14: Distant recurrence at 9 years: 4%   09/11/2021 - 10/08/2021 Radiation Therapy   Site Technique Total Dose (Gy) Dose per Fx (Gy) Completed Fx Beam Energies  Breast, Left: Breast_L_Axilla 3D 40.05/40.05 2.67 15/15 10X  Breast, Left: Breast_L_Bst 3D 10/10 2 5/5 6X, 10X     10/07/2021 -  Anti-estrogen oral therapy   20 MG Tamoxifen x 10 years     CHIEF COMPLIANT: Follow-up after recent CT scans  HISTORY OF PRESENT ILLNESS:   History of Present Illness   Elizabeth Cummings, a patient with a history of kidney stones and breast cancer, presents for a follow-up visit. She has been undergoing regular CT scans every six months to monitor her condition. The most recent CT scan showed that the kidney stones are still present, but no other significant findings were noted.  [name] has been taking tamoxifen for her breast cancer and reports no issues with the medication. She has also been experiencing hot flashes, which are manageable. She recently started taking turmeric, but after learning about  a potential interaction with tamoxifen, she decided to discontinue  it.  In addition to her cancer treatment, [name] has been managing her weight and has lost about 48 pounds. She reports feeling much better but admits to not eating as she should, particularly in terms of protein intake. She is also on Pravachol for her cholesterol levels, which her primary care provider recently increased to get her cholesterol under 70 due to her diabetes.  [name] also has a Mirena IUD and is due for a check-up with her primary care provider in January to determine if she is in menopause. If she is, she plans to have the IUD removed and switch medications.         ALLERGIES:  is allergic to gluten meal and dilaudid [hydromorphone hcl].  MEDICATIONS:  Current Outpatient Medications  Medication Sig Dispense Refill   azelastine (ASTELIN) 0.1 % nasal spray Place 1-2 sprays into both nostrils 2 (two) times daily. 30 mL 5   Blood Glucose Monitoring Suppl (ACCU-CHEK GUIDE) w/Device KIT Use as directed to test blood sugar once daily. 1 kit 1   calcium-vitamin D (OSCAL WITH D) 500-200 MG-UNIT per tablet Take 1 tablet by mouth daily.     Cholecalciferol (VITAMIN D) 50 MCG (2000 UT) CAPS Take 1 capsule (2,000 Units total) by mouth daily. 30 capsule 0   ciprofloxacin (CIPRO) 500 MG tablet Take 1 tablet (500 mg total) by mouth every 12 (twelve) hours. 30 tablet 0   [employer] FIBER GUMMIES PO Take 2 Units by mouth daily.     fexofenadine (ALLEGRA) 180 MG tablet Take 1 tablet Orally Once a day in the morning. (please fill with levocetirizine) 90 tablet 3   glucose blood (ACCU-CHEK GUIDE) test strip Use one strip daily to test blood sugar once a day. 100 each 3   hydrochlorothiazide (HYDRODIURIL) 25 MG tablet Take 1 tablet (25 mg total) by mouth daily for edema 90 tablet 2   Accu-Chek Softclix Lancets lancets Use one lancet daily as directed to test blood sugar. 100 each 3   levocetirizine (XYZAL) 5 MG tablet Take 1 tablet (5 mg total) by mouth every evening. 90 tablet 3   levonorgestrel  (MIRENA, 52 MG,) 20 MCG/DAY IUD 1 Intra Uterine Device by Intrauterine route once.     Multiple Vitamin (MULTIVITAMIN) tablet Take 1 tablet by mouth daily.     pravastatin (PRAVACHOL) 40 MG tablet Take 1 tablet (40 mg total) by mouth daily. 90 tablet 0   Probiotic Product (DIGESTIVE ADV DIGESTIVE/IMMUNE PO) Take 1 Units by mouth daily.     tamoxifen (NOLVADEX) 20 MG tablet Take 1 tablet (20 mg total) by mouth daily. 90 tablet 3   tirzepatide (MOUNJARO) 15 MG/0.5ML Pen Inject 15 mg into the skin once a week. 6 mL 0   No current facility-administered medications for this visit.    PHYSICAL EXAMINATION: ECOG PERFORMANCE STATUS: 1 - Symptomatic but completely ambulatory  Vitals:   02/12/23 1239  BP: 135/79  Pulse: 96  Resp: 18  Temp: 98 F (36.7 C)  SpO2: 100%   Filed Weights   02/12/23 1239  Weight: 190 lb 6.4 oz (86.4 kg)      LABORATORY DATA:  I have reviewed the data as listed    Latest Ref Rng & Units 02/08/2023    3:20 PM 09/23/2022   12:00 AM 07/01/2022    2:28 PM  CMP  Creatinine 0.44 - 1.00 mg/dL 1.61     Potassium 3.5 - 5.1 mEq/L  3.5  3.5   Chloride 99 - 108  100    CO2 13 - 22  30    Calcium 8.7 - 10.7  9.4    Alkaline Phos 25 - 125  58    AST 13 - 35  20    ALT 7 - 35 U/L  16      Lab Results  Component Value Date   WBC 7.1 09/23/2022   HGB 12.3 09/23/2022   HCT 37 09/23/2022   MCV 85 12/23/2021   PLT 327 09/23/2022   NEUTROABS 4.9 12/23/2021    ASSESSMENT & PLAN:  Malignant neoplasm of upper-outer quadrant of left breast in female, estrogen receptor positive (HCC) 07/28/2021: Left lumpectomy: Grade 2 ILC 3.5 cm with LCIS, intraductal papilloma with UBH, margins negative, 1 lymph node with isolated tumor cells, ER 90%, PR 80%, HER2 negative, Ki-67 4% Oncotype score 14: Distant recurrence at 9 years: 4% 09/11/2021-10/08/2021 Adjuvant radiation:    Current treatment: adjuvant antiestrogen therapy with tamoxifen 20 mg daily x10 years (she will start at 10  mg a day for the first month and then increase it to 20 if she tolerates it well), plan to switch to aromatase inhibitor once she is in menopause    Tamoxifen toxicities: Mild hot flashes.   Weight: Patient goes to the healthy weight and wellness clinic and has lost 45 pounds with Monjuro. 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 Patient is still premenopausal: June 2024 estradiol 6.8, FSH 25.  She gets every 6 months menopause labs.  If the [facility] crosses 35 we can switch her from tamoxifen to anastrozole.   We will alternate mammograms and breast MRIs.   Return to clinic in 6 months for CT CAP and follow-up ------------------------------------- Assessment and Plan    Breast Cancer Surveillance Recent CT scan showed no obvious findings. Patient is currently on Tamoxifen with manageable side effects. Noted interaction with Turmeric supplement which may reduce Tamoxifen efficacy. -Continue Tamoxifen. -Discontinue Turmeric supplement. -Order follow-up CT scan in 6 months. -Order MRI in March to follow up on previous findings of stromal fibrosis, epithelial atopy, and fat necrosis.  Menopausal Status Patient currently has Mirena IUD. FSH and estradiol levels to be checked in January to confirm menopausal status. -Check FSH and estradiol levels in January. -If menopausal, consider switching medications and removing Mirena IUD.  Kidney Stone Kidney stone still present on recent CT  scan, but no current symptoms reported. -Continue to monitor.  Weight Loss Patient has lost significant weight and is managing well. -Encourage continued healthy habits.  Hyperlipidemia Pravachol dose increased by primary care provider to manage cholesterol levels. -Continue Pravachol as prescribed by primary care provider.  Diabetes Patient is managing diabetes with Mounjaro. -Continue Mounjaro as prescribed.          Orders Placed This Encounter  Procedures   CT CHEST ABDOMEN PELVIS W CONTRAST    Standing Status:   Future    Standing Expiration Date:   02/12/2024    Order Specific Question:   If indicated  for the ordered procedure, I authorize the administration of contrast media per Radiology protocol    Answer:   Yes    Order Specific Question:   Does the patient have a contrast media/X-ray dye allergy?    Answer:   No    Order Specific Question:   Is patient pregnant?    Answer:   No    Order Specific Question:   Preferred imaging location?    Answer:   [facility]    Order Specific Question:   Release to patient    Answer:   Immediate    Order Specific Question:   If indicated for the ordered procedure, I authorize the administration of oral contrast media per Radiology protocol    Answer:   Yes   MR BREAST BILATERAL W WO CONTRAST INC CAD    Standing Status:   Future    Standing Expiration Date:   02/12/2024    Order Specific Question:   If indicated for the ordered procedure, I authorize the administration of contrast media per Radiology protocol    Answer:   Yes    Order Specific Question:   What is the patient's sedation requirement?    Answer:   No Sedation    Order Specific Question:   Does the patient have a pacemaker or implanted devices?    Answer:   No    Order Specific Question:   Preferred imaging location?    Answer:   [facility] (table limit-550lbs)    Order Specific Question:   Release to patient    Answer:   Immediate   The  patient has a good understanding of the overall plan. she agrees with it. she will call with any problems that may develop before the next visit here. Total time spent: 30 mins including face to face time and time spent for planning, charting and co-ordination of care   Tamsen Meek, MD 02/12/23

## 2023-02-12 NOTE — Assessment & Plan Note (Signed)
07/28/2021: Left lumpectomy: Grade 2 ILC 3.5 cm with LCIS, intraductal papilloma with UBH, margins negative, 1 lymph node with isolated tumor cells, ER 90%, PR 80%, HER2 negative, Ki-67 4% Oncotype score 14: Distant recurrence at 9 years: 4% 09/11/2021-10/08/2021 Adjuvant radiation:    Current treatment: adjuvant antiestrogen therapy with tamoxifen 20 mg daily x10 years (she will start at 10 mg a day for the first month and then increase it to 20 if she tolerates it well), plan to switch to aromatase inhibitor once she is in menopause    Tamoxifen toxicities: Mild hot flashes.   Weight: Patient goes to the healthy weight and wellness clinic and has lost 45 pounds with Monjuro. 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 Patient is still premenopausal: June 2024 estradiol 6.8, FSH 25.  She gets every 6 months menopause labs.  If the [facility] crosses 35 we can switch her from tamoxifen to anastrozole.   We will alternate mammograms and breast MRIs.   Return to clinic in 6 months for CT CAP and follow-up

## 2023-02-15 ENCOUNTER — Other Ambulatory Visit: Payer: Self-pay

## 2023-02-15 ENCOUNTER — Other Ambulatory Visit (HOSPITAL_COMMUNITY): Payer: Self-pay

## 2023-02-15 MED ORDER — HYDROCHLOROTHIAZIDE 25 MG PO TABS
25.0000 mg | ORAL_TABLET | Freq: Every morning | ORAL | 1 refills | Status: DC
  Filled 2023-02-15: qty 90, 90d supply, fill #0
  Filled 2023-05-17: qty 90, 90d supply, fill #1

## 2023-02-16 ENCOUNTER — Other Ambulatory Visit (HOSPITAL_COMMUNITY): Payer: Self-pay

## 2023-02-16 MED ORDER — CIPROFLOXACIN HCL 500 MG PO TABS
500.0000 mg | ORAL_TABLET | Freq: Two times a day (BID) | ORAL | 0 refills | Status: AC
  Filled 2023-02-16: qty 30, 15d supply, fill #0

## 2023-02-17 ENCOUNTER — Other Ambulatory Visit (HOSPITAL_COMMUNITY): Payer: Self-pay

## 2023-02-17 DIAGNOSIS — Z6832 Body mass index (BMI) 32.0-32.9, adult: Secondary | ICD-10-CM | POA: Diagnosis not present

## 2023-02-17 DIAGNOSIS — E7849 Other hyperlipidemia: Secondary | ICD-10-CM | POA: Diagnosis not present

## 2023-02-17 DIAGNOSIS — E1169 Type 2 diabetes mellitus with other specified complication: Secondary | ICD-10-CM | POA: Diagnosis not present

## 2023-02-17 DIAGNOSIS — E559 Vitamin D deficiency, unspecified: Secondary | ICD-10-CM | POA: Diagnosis not present

## 2023-02-17 DIAGNOSIS — Z853 Personal history of malignant neoplasm of breast: Secondary | ICD-10-CM | POA: Diagnosis not present

## 2023-02-17 DIAGNOSIS — E66811 Obesity, class 1: Secondary | ICD-10-CM | POA: Diagnosis not present

## 2023-02-17 MED ORDER — PRAVASTATIN SODIUM 40 MG PO TABS
40.0000 mg | ORAL_TABLET | Freq: Every day | ORAL | 0 refills | Status: DC
  Filled 2023-02-17 – 2023-04-23 (×2): qty 90, 90d supply, fill #0

## 2023-03-11 ENCOUNTER — Other Ambulatory Visit (HOSPITAL_COMMUNITY): Payer: Self-pay

## 2023-03-18 DIAGNOSIS — C50919 Malignant neoplasm of unspecified site of unspecified female breast: Secondary | ICD-10-CM | POA: Diagnosis not present

## 2023-03-18 DIAGNOSIS — D72829 Elevated white blood cell count, unspecified: Secondary | ICD-10-CM | POA: Diagnosis not present

## 2023-03-18 DIAGNOSIS — E1169 Type 2 diabetes mellitus with other specified complication: Secondary | ICD-10-CM | POA: Diagnosis not present

## 2023-03-18 DIAGNOSIS — F418 Other specified anxiety disorders: Secondary | ICD-10-CM | POA: Diagnosis not present

## 2023-03-18 DIAGNOSIS — E7800 Pure hypercholesterolemia, unspecified: Secondary | ICD-10-CM | POA: Diagnosis not present

## 2023-03-25 ENCOUNTER — Other Ambulatory Visit (HOSPITAL_COMMUNITY): Payer: Self-pay

## 2023-03-25 DIAGNOSIS — M25512 Pain in left shoulder: Secondary | ICD-10-CM | POA: Diagnosis not present

## 2023-03-25 DIAGNOSIS — M25511 Pain in right shoulder: Secondary | ICD-10-CM | POA: Diagnosis not present

## 2023-03-25 MED ORDER — MELOXICAM 15 MG PO TABS
15.0000 mg | ORAL_TABLET | Freq: Every day | ORAL | 0 refills | Status: AC
  Filled 2023-03-25: qty 30, 30d supply, fill #0

## 2023-03-26 ENCOUNTER — Other Ambulatory Visit: Payer: Self-pay | Admitting: *Deleted

## 2023-03-26 ENCOUNTER — Encounter: Payer: Self-pay | Admitting: Hematology and Oncology

## 2023-03-26 DIAGNOSIS — C50412 Malignant neoplasm of upper-outer quadrant of left female breast: Secondary | ICD-10-CM

## 2023-03-26 NOTE — Progress Notes (Signed)
 Per MD due to pt recent abnormal breast MRI, repeat breast MRI needing to be completed in March of 2025 and pt yearly mammogram will be completed 6 months later.  Orders placed, pt notified and verbalized understanding.

## 2023-03-31 ENCOUNTER — Other Ambulatory Visit (HOSPITAL_COMMUNITY): Payer: Self-pay

## 2023-03-31 ENCOUNTER — Encounter: Payer: Self-pay | Admitting: Family Medicine

## 2023-03-31 DIAGNOSIS — E559 Vitamin D deficiency, unspecified: Secondary | ICD-10-CM | POA: Diagnosis not present

## 2023-03-31 DIAGNOSIS — E1169 Type 2 diabetes mellitus with other specified complication: Secondary | ICD-10-CM | POA: Diagnosis not present

## 2023-03-31 DIAGNOSIS — Z6832 Body mass index (BMI) 32.0-32.9, adult: Secondary | ICD-10-CM | POA: Diagnosis not present

## 2023-03-31 DIAGNOSIS — E66811 Obesity, class 1: Secondary | ICD-10-CM | POA: Diagnosis not present

## 2023-03-31 DIAGNOSIS — E7849 Other hyperlipidemia: Secondary | ICD-10-CM | POA: Diagnosis not present

## 2023-03-31 MED ORDER — MOUNJARO 15 MG/0.5ML ~~LOC~~ SOAJ
15.0000 mg | SUBCUTANEOUS | 0 refills | Status: DC
  Filled 2023-03-31 – 2023-05-17 (×2): qty 6, 84d supply, fill #0

## 2023-04-05 DIAGNOSIS — M25511 Pain in right shoulder: Secondary | ICD-10-CM | POA: Diagnosis not present

## 2023-04-08 DIAGNOSIS — C50412 Malignant neoplasm of upper-outer quadrant of left female breast: Secondary | ICD-10-CM | POA: Diagnosis not present

## 2023-04-08 DIAGNOSIS — Z170 Estrogen receptor positive status [ER+]: Secondary | ICD-10-CM | POA: Diagnosis not present

## 2023-04-09 DIAGNOSIS — H9042 Sensorineural hearing loss, unilateral, left ear, with unrestricted hearing on the contralateral side: Secondary | ICD-10-CM | POA: Diagnosis not present

## 2023-04-14 ENCOUNTER — Telehealth: Payer: Self-pay | Admitting: Hematology and Oncology

## 2023-04-14 LAB — SIGNATERA
SIGNATERA MTM READOUT: 0 MTM/ml
SIGNATERA TEST RESULT: NEGATIVE

## 2023-04-14 NOTE — Telephone Encounter (Signed)
Scheduled appointment per 1/7 scheduling message. Patient is aware of the made appointment and will be mailed an appointment reminder.

## 2023-04-15 ENCOUNTER — Telehealth: Payer: Self-pay

## 2023-04-15 NOTE — Telephone Encounter (Signed)
Called pt per MD to advise Signatera testing was negative/not detected. Pt verbalized understanding of results and knows [facility] will be in touch to schedule 3 mo repeat lab.   

## 2023-04-22 ENCOUNTER — Ambulatory Visit (HOSPITAL_COMMUNITY): Payer: 59

## 2023-04-23 ENCOUNTER — Other Ambulatory Visit: Payer: Self-pay

## 2023-04-23 ENCOUNTER — Other Ambulatory Visit (HOSPITAL_COMMUNITY): Payer: Self-pay

## 2023-04-28 ENCOUNTER — Ambulatory Visit: Payer: 59 | Admitting: Hematology and Oncology

## 2023-04-29 ENCOUNTER — Other Ambulatory Visit (HOSPITAL_COMMUNITY): Payer: Self-pay

## 2023-04-29 MED ORDER — OSELTAMIVIR PHOSPHATE 75 MG PO CAPS
75.0000 mg | ORAL_CAPSULE | Freq: Every day | ORAL | 0 refills | Status: DC
  Filled 2023-04-29: qty 14, 14d supply, fill #0

## 2023-05-11 DIAGNOSIS — E1169 Type 2 diabetes mellitus with other specified complication: Secondary | ICD-10-CM | POA: Diagnosis not present

## 2023-05-11 DIAGNOSIS — E7849 Other hyperlipidemia: Secondary | ICD-10-CM | POA: Diagnosis not present

## 2023-05-11 DIAGNOSIS — E559 Vitamin D deficiency, unspecified: Secondary | ICD-10-CM | POA: Diagnosis not present

## 2023-05-11 DIAGNOSIS — Z6832 Body mass index (BMI) 32.0-32.9, adult: Secondary | ICD-10-CM | POA: Diagnosis not present

## 2023-05-11 DIAGNOSIS — E66811 Obesity, class 1: Secondary | ICD-10-CM | POA: Diagnosis not present

## 2023-05-17 ENCOUNTER — Other Ambulatory Visit (HOSPITAL_COMMUNITY): Payer: Self-pay

## 2023-05-24 DIAGNOSIS — Z1389 Encounter for screening for other disorder: Secondary | ICD-10-CM | POA: Diagnosis not present

## 2023-05-24 DIAGNOSIS — Z01419 Encounter for gynecological examination (general) (routine) without abnormal findings: Secondary | ICD-10-CM | POA: Diagnosis not present

## 2023-05-24 DIAGNOSIS — Z130 Encounter for screening for diseases of the blood and blood-forming organs and certain disorders involving the immune mechanism: Secondary | ICD-10-CM | POA: Diagnosis not present

## 2023-05-24 DIAGNOSIS — Z30431 Encounter for routine checking of intrauterine contraceptive device: Secondary | ICD-10-CM | POA: Diagnosis not present

## 2023-06-01 DIAGNOSIS — H52223 Regular astigmatism, bilateral: Secondary | ICD-10-CM | POA: Diagnosis not present

## 2023-06-01 DIAGNOSIS — E119 Type 2 diabetes mellitus without complications: Secondary | ICD-10-CM | POA: Diagnosis not present

## 2023-06-01 DIAGNOSIS — H524 Presbyopia: Secondary | ICD-10-CM | POA: Diagnosis not present

## 2023-06-04 ENCOUNTER — Ambulatory Visit
Admission: RE | Admit: 2023-06-04 | Discharge: 2023-06-04 | Disposition: A | Discharge status: Home / Self Care | Payer: 59 | Source: Ambulatory Visit | Attending: Hematology and Oncology | Admitting: Hematology and Oncology

## 2023-06-04 DIAGNOSIS — R87611 Atypical squamous cells cannot exclude high grade squamous intraepithelial lesion on cytologic smear of cervix (ASC-H): Secondary | ICD-10-CM | POA: Diagnosis not present

## 2023-06-04 DIAGNOSIS — C50412 Malignant neoplasm of upper-outer quadrant of left female breast: Secondary | ICD-10-CM

## 2023-06-04 MED ORDER — GADOPICLENOL 0.5 MMOL/ML IV SOLN
8.5000 mL | Freq: Once | INTRAVENOUS | Status: AC | PRN
  Administered 2023-06-04: 8.5 mL via INTRAVENOUS

## 2023-06-22 ENCOUNTER — Ambulatory Visit: Payer: 59 | Admitting: Hematology and Oncology

## 2023-06-22 DIAGNOSIS — E7849 Other hyperlipidemia: Secondary | ICD-10-CM | POA: Diagnosis not present

## 2023-06-22 DIAGNOSIS — E559 Vitamin D deficiency, unspecified: Secondary | ICD-10-CM | POA: Diagnosis not present

## 2023-06-22 DIAGNOSIS — Z6832 Body mass index (BMI) 32.0-32.9, adult: Secondary | ICD-10-CM | POA: Diagnosis not present

## 2023-06-22 DIAGNOSIS — E1169 Type 2 diabetes mellitus with other specified complication: Secondary | ICD-10-CM | POA: Diagnosis not present

## 2023-06-22 DIAGNOSIS — E66811 Obesity, class 1: Secondary | ICD-10-CM | POA: Diagnosis not present

## 2023-07-14 LAB — SIGNATERA
SIGNATERA MTM READOUT: 0.07 MTM/ml — AB
SIGNATERA TEST RESULT: POSITIVE — AB

## 2023-07-15 ENCOUNTER — Other Ambulatory Visit: Payer: Self-pay | Admitting: *Deleted

## 2023-07-15 DIAGNOSIS — Z170 Estrogen receptor positive status [ER+]: Secondary | ICD-10-CM

## 2023-07-19 ENCOUNTER — Encounter: Payer: Self-pay | Admitting: Hematology and Oncology

## 2023-07-20 ENCOUNTER — Other Ambulatory Visit (HOSPITAL_COMMUNITY): Payer: Self-pay

## 2023-07-20 DIAGNOSIS — E66811 Obesity, class 1: Secondary | ICD-10-CM | POA: Diagnosis not present

## 2023-07-20 DIAGNOSIS — Z6832 Body mass index (BMI) 32.0-32.9, adult: Secondary | ICD-10-CM | POA: Diagnosis not present

## 2023-07-20 DIAGNOSIS — E7849 Other hyperlipidemia: Secondary | ICD-10-CM | POA: Diagnosis not present

## 2023-07-20 DIAGNOSIS — E1169 Type 2 diabetes mellitus with other specified complication: Secondary | ICD-10-CM | POA: Diagnosis not present

## 2023-07-20 DIAGNOSIS — E559 Vitamin D deficiency, unspecified: Secondary | ICD-10-CM | POA: Diagnosis not present

## 2023-07-20 MED ORDER — MOUNJARO 15 MG/0.5ML ~~LOC~~ SOAJ
15.0000 mg | SUBCUTANEOUS | 1 refills | Status: AC
  Filled 2023-07-20 – 2023-08-11 (×2): qty 6, 84d supply, fill #0
  Filled 2023-10-25: qty 6, 84d supply, fill #1

## 2023-07-22 ENCOUNTER — Other Ambulatory Visit (HOSPITAL_COMMUNITY): Payer: Self-pay

## 2023-07-22 MED ORDER — PRAVASTATIN SODIUM 40 MG PO TABS
40.0000 mg | ORAL_TABLET | Freq: Every day | ORAL | 1 refills | Status: AC
  Filled 2023-07-22: qty 90, 90d supply, fill #0
  Filled 2023-10-25: qty 90, 90d supply, fill #1

## 2023-07-23 ENCOUNTER — Telehealth: Payer: Self-pay | Admitting: Hematology and Oncology

## 2023-07-23 NOTE — Telephone Encounter (Signed)
 The patient called and moved her December appointment up to may. The patient is aware of the appointment changes.

## 2023-08-05 ENCOUNTER — Other Ambulatory Visit: Payer: Self-pay | Admitting: Hematology and Oncology

## 2023-08-09 ENCOUNTER — Ambulatory Visit (HOSPITAL_COMMUNITY)
Admission: RE | Admit: 2023-08-09 | Discharge: 2023-08-09 | Disposition: A | Discharge status: Home / Self Care | Payer: 59 | Source: Ambulatory Visit | Attending: Hematology and Oncology | Admitting: Hematology and Oncology

## 2023-08-09 ENCOUNTER — Inpatient Hospital Stay: Attending: Hematology and Oncology

## 2023-08-09 DIAGNOSIS — Z9049 Acquired absence of other specified parts of digestive tract: Secondary | ICD-10-CM | POA: Diagnosis not present

## 2023-08-09 DIAGNOSIS — C50412 Malignant neoplasm of upper-outer quadrant of left female breast: Secondary | ICD-10-CM | POA: Diagnosis not present

## 2023-08-09 DIAGNOSIS — Z170 Estrogen receptor positive status [ER+]: Secondary | ICD-10-CM | POA: Insufficient documentation

## 2023-08-09 DIAGNOSIS — N200 Calculus of kidney: Secondary | ICD-10-CM | POA: Diagnosis not present

## 2023-08-09 DIAGNOSIS — Z1721 Progesterone receptor positive status: Secondary | ICD-10-CM | POA: Insufficient documentation

## 2023-08-09 DIAGNOSIS — Z1732 Human epidermal growth factor receptor 2 negative status: Secondary | ICD-10-CM | POA: Diagnosis not present

## 2023-08-09 DIAGNOSIS — K760 Fatty (change of) liver, not elsewhere classified: Secondary | ICD-10-CM | POA: Diagnosis not present

## 2023-08-09 DIAGNOSIS — Z923 Personal history of irradiation: Secondary | ICD-10-CM | POA: Diagnosis not present

## 2023-08-09 DIAGNOSIS — R911 Solitary pulmonary nodule: Secondary | ICD-10-CM | POA: Diagnosis not present

## 2023-08-09 DIAGNOSIS — Z79810 Long term (current) use of selective estrogen receptor modulators (SERMs): Secondary | ICD-10-CM | POA: Diagnosis not present

## 2023-08-09 LAB — CMP (CANCER CENTER ONLY)
ALT: 17 U/L (ref 0–44)
AST: 23 U/L (ref 15–41)
Albumin: 4.2 g/dL (ref 3.5–5.0)
Alkaline Phosphatase: 61 U/L (ref 38–126)
Anion gap: 9 (ref 5–15)
BUN: 13 mg/dL (ref 6–20)
CO2: 28 mmol/L (ref 22–32)
Calcium: 8.7 mg/dL — ABNORMAL LOW (ref 8.9–10.3)
Chloride: 103 mmol/L (ref 98–111)
Creatinine: 0.63 mg/dL (ref 0.44–1.00)
GFR, Estimated: >60 mL/min (ref >60)
Glucose, Bld: 99 mg/dL (ref 70–99)
Potassium: 3 mmol/L — ABNORMAL LOW (ref 3.5–5.1)
Sodium: 140 mmol/L (ref 135–145)
Total Bilirubin: 0.3 mg/dL (ref 0.0–1.2)
Total Protein: 7.3 g/dL (ref 6.5–8.1)

## 2023-08-09 LAB — CBC WITH DIFFERENTIAL (CANCER CENTER ONLY)
Abs Immature Granulocytes: 0.02 10*3/uL (ref 0.00–0.07)
Basophils Absolute: 0.1 10*3/uL (ref 0.0–0.1)
Basophils Relative: 1 %
Eosinophils Absolute: 0.2 10*3/uL (ref 0.0–0.5)
Eosinophils Relative: 2 %
HCT: 36.7 % (ref 36.0–46.0)
Hemoglobin: 12.6 g/dL (ref 12.0–15.0)
Immature Granulocytes: 0 %
Lymphocytes Relative: 32 %
Lymphs Abs: 2.9 10*3/uL (ref 0.7–4.0)
MCH: 28.8 pg (ref 26.0–34.0)
MCHC: 34.3 g/dL (ref 30.0–36.0)
MCV: 84 fL (ref 80.0–100.0)
Monocytes Absolute: 0.6 10*3/uL (ref 0.1–1.0)
Monocytes Relative: 7 %
Neutro Abs: 5.3 10*3/uL (ref 1.7–7.7)
Neutrophils Relative %: 58 %
Platelet Count: 318 10*3/uL (ref 150–400)
RBC: 4.37 MIL/uL (ref 3.87–5.11)
RDW: 14 % (ref 11.5–15.5)
WBC Count: 9 10*3/uL (ref 4.0–10.5)
nRBC: 0 % (ref 0.0–0.2)

## 2023-08-09 MED ORDER — IOHEXOL 300 MG/ML  SOLN
100.0000 mL | Freq: Once | INTRAMUSCULAR | Status: AC | PRN
  Administered 2023-08-09: 100 mL via INTRAVENOUS

## 2023-08-10 LAB — FOLLICLE STIMULATING HORMONE: FSH: 23.5 m[IU]/mL

## 2023-08-11 ENCOUNTER — Other Ambulatory Visit (HOSPITAL_COMMUNITY): Payer: Self-pay

## 2023-08-11 ENCOUNTER — Other Ambulatory Visit: Payer: Self-pay

## 2023-08-11 ENCOUNTER — Other Ambulatory Visit: Payer: 59

## 2023-08-11 MED ORDER — HYDROCHLOROTHIAZIDE 25 MG PO TABS
25.0000 mg | ORAL_TABLET | Freq: Every morning | ORAL | 0 refills | Status: DC
  Filled 2023-08-11: qty 90, 90d supply, fill #0

## 2023-08-17 LAB — ESTRADIOL, ULTRA SENS: Estradiol, Sensitive: 10 pg/mL

## 2023-08-18 ENCOUNTER — Ambulatory Visit: Payer: 59 | Admitting: Hematology and Oncology

## 2023-08-19 ENCOUNTER — Inpatient Hospital Stay (HOSPITAL_BASED_OUTPATIENT_CLINIC_OR_DEPARTMENT_OTHER): Admitting: Hematology and Oncology

## 2023-08-19 VITALS — BP 129/68 | HR 91 | Temp 98.9°F | Resp 16 | Wt 191.9 lb

## 2023-08-19 DIAGNOSIS — Z1721 Progesterone receptor positive status: Secondary | ICD-10-CM | POA: Diagnosis not present

## 2023-08-19 DIAGNOSIS — C50412 Malignant neoplasm of upper-outer quadrant of left female breast: Secondary | ICD-10-CM | POA: Diagnosis not present

## 2023-08-19 DIAGNOSIS — Z923 Personal history of irradiation: Secondary | ICD-10-CM | POA: Diagnosis not present

## 2023-08-19 DIAGNOSIS — Z1732 Human epidermal growth factor receptor 2 negative status: Secondary | ICD-10-CM | POA: Diagnosis not present

## 2023-08-19 DIAGNOSIS — Z79810 Long term (current) use of selective estrogen receptor modulators (SERMs): Secondary | ICD-10-CM | POA: Diagnosis not present

## 2023-08-19 DIAGNOSIS — Z170 Estrogen receptor positive status [ER+]: Secondary | ICD-10-CM | POA: Diagnosis not present

## 2023-08-19 NOTE — Assessment & Plan Note (Signed)
 07/28/2021: Left lumpectomy: Grade 2 ILC 3.5 cm with LCIS, intraductal papilloma with UBH, margins negative, 1 lymph node with isolated tumor cells, ER 90%, PR 80%, HER2 negative, Ki-67 4% Oncotype score 14: Distant recurrence at 9 years: 4% 09/11/2021-10/08/2021 Adjuvant radiation:    Current treatment: adjuvant antiestrogen therapy with tamoxifen  20 mg daily x10 years (she will start at 10 mg a day for the first month and then increase it to 20 if she tolerates it well), plan to switch to aromatase inhibitor once she is in menopause    Tamoxifen  toxicities: Mild hot flashes.   Weight: Patient goes to the healthy weight and wellness clinic and has lost 45 pounds with Monjuro. 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 Patient is still premenopausal: June 2024 estradiol  6.8, FSH 25.  She gets every 6 months menopause labs.  If the [facility] crosses 35 we can switch her from tamoxifen  to anastrozole.   CT CAP: 08/09/23: unchanged 0.2 cm nodule RUL non specific, hep steatosis We will alternate mammograms and breast MRIs.    Return to clinic in 6 months for CT CAP and follow-up

## 2023-08-19 NOTE — Progress Notes (Signed)
 Patient Care Team: Glena Landau, MD as PCP - General (Family Medicine) Bensimhon, Rheta Celestine, MD as Consulting Physician (Cardiology) Anselmo Kings, MD as Referring Physician (Allergy and Immunology) Cameron Cea, MD as Consulting Physician (Hematology and Oncology) Colie Dawes, MD as Attending Physician (Radiation Oncology) Enid Harry, MD as Consulting Physician (General Surgery)  DIAGNOSIS:  Encounter Diagnosis  Name Primary?   Malignant neoplasm of upper-outer quadrant of left breast in female, estrogen receptor positive (HCC) Yes    SUMMARY OF ONCOLOGIC HISTORY: Oncology History  Malignant neoplasm of upper-outer quadrant of left breast in female, estrogen receptor positive (HCC)  06/27/2021 Initial Diagnosis   Screening mammogram detected left breast asymmetry and distortion.  No ultrasound correlate.  Axilla negative, stereotactic biopsy showed grade 1 invasive lobular cancer with LCIS, negative for lymphovascular invasion, ER 90%, PR 80%, Ki-67 4%, HER2 equivocal by IHC, FISH negative, ratio 1.21   07/10/2021 Cancer Staging   Staging form: Breast, AJCC 8th Edition - Clinical: Stage Unknown (cTX, cN0, cM0, G1, ER+, PR+, HER2-) - Signed by Cameron Cea, MD on 07/10/2021 Stage prefix: Initial diagnosis Histologic grading system: 3 grade system    Genetic Testing   Negative genetic testing. No pathogenic variants identified on the [facility] BRCAPlus+ CancerNext-Expanded+RNA panel. VUS in KIF1B called [id number] identified. The report date is 07/25/2021.  The CancerNext-Expanded + RNAinsight gene panel offered by [employer] and includes sequencing and rearrangement analysis for the following 77 genes: IP, ALK, APC*, ATM*, AXIN2, BAP1, BARD1, BLM, BMPR1A, BRCA1*, BRCA2*, BRIP1*, CDC73, CDH1*,CDK4, CDKN1B, CDKN2A, CHEK2*, CTNNA1, DICER1, FANCC, FH, FLCN, GALNT12, KIF1B, LZTR1, MAX, MEN1, MET, MLH1*, MSH2*, MSH3, MSH6*, MUTYH*, NBN, NF1*, NF2, NTHL1, PALB2*, PHOX2B, PMS2*, POT1,  PRKAR1A, PTCH1, PTEN*, RAD51C*, RAD51D*,RB1, RECQL, RET, SDHA, SDHAF2, SDHB, SDHC, SDHD, SMAD4, SMARCA4, SMARCB1, SMARCE1, STK11, SUFU, TMEM127, TP53*,TSC1, TSC2, VHL and XRCC2 (sequencing and deletion/duplication); EGFR, EGLN1, HOXB13, KIT, MITF, PDGFRA, POLD1 and POLE (sequencing only); EPCAM and GREM1 (deletion/duplication only).   07/28/2021 Surgery   07/28/2021: Left lumpectomy: Grade 2 ILC 3.5 cm with LCIS, intraductal papilloma with UBH, margins negative, 1 lymph node with isolated tumor cells, ER 90%, PR 80%, HER2 negative, Ki-67 4%   08/05/2021 Cancer Staging   Staging form: Breast, AJCC 8th Edition - Pathologic: Stage IA (pT2, pN0(i+), cM0, G2, ER+, PR+, HER2-) - Signed by Cameron Cea, MD on 08/05/2021 Multigene prognostic tests performed: Oncotype DX Histologic grading system: 3 grade system   08/12/2021 Oncotype testing   Oncotype score 14: Distant recurrence at 9 years: 4%   09/11/2021 - 10/08/2021 Radiation Therapy   Site Technique Total Dose (Gy) Dose per Fx (Gy) Completed Fx Beam Energies  Breast, Left: Breast_L_Axilla 3D 40.05/40.05 2.67 15/15 10X  Breast, Left: Breast_L_Bst 3D 10/10 2 5/5 6X, 10X     10/07/2021 -  Anti-estrogen oral therapy   20 MG Tamoxifen  x 10 years     CHIEF COMPLIANT: Follow-up on tamoxifen  therapy  HISTORY OF PRESENT ILLNESS:  History of Present Illness Elizabeth Cummings is a 48 year old female who presents for follow-up of a lung nodule and menopause evaluation.  A CT scan shows a 0.2 cm nodule in the inferior right upper lobe, unchanged in size. She is concerned about the nodule, which is described as 'literally a mustard seed' in size. There are no new bone findings. Imaging also notes a fatty liver.  Her family history includes kidney cancer, with her father recently diagnosed and her grandmother having had the condition. Her father is scheduled for a  nephrectomy.     ALLERGIES:  is allergic to gluten meal, wound dressing adhesive, and  dilaudid [hydromorphone hcl].  MEDICATIONS:  Current Outpatient Medications  Medication Sig Dispense Refill   azelastine  (ASTELIN ) 0.1 % nasal spray Place 1-2 sprays into both nostrils 2 (two) times daily. 30 mL 5   Blood Glucose Monitoring Suppl (ACCU-CHEK GUIDE) w/Device KIT Use as directed to test blood sugar once daily. 1 kit 1   calcium-vitamin D  (OSCAL WITH D) 500-200 MG-UNIT per tablet Take 1 tablet by mouth daily.     Cholecalciferol (VITAMIN D ) 50 MCG (2000 UT) CAPS Take 1 capsule (2,000 Units total) by mouth daily. 30 capsule 0   ciprofloxacin  (CIPRO ) 500 MG tablet Take 1 tablet (500 mg total) by mouth every 12 (twelve) hours. 30 tablet 0   [employer] FIBER GUMMIES PO Take 2 Units by mouth daily.     fexofenadine  (ALLEGRA ) 180 MG tablet Take 1 tablet Orally Once a day in the morning. (please fill with levocetirizine) 90 tablet 3   glucose blood (ACCU-CHEK GUIDE) test strip Use one strip daily to test blood sugar once a day. 100 each 3   hydrochlorothiazide  (HYDRODIURIL ) 25 MG tablet Take 1 tablet (25 mg total) by mouth in the morning. 90 tablet 0   Accu-Chek Softclix Lancets lancets Use one lancet daily as directed to test blood sugar. 100 each 3   levocetirizine (XYZAL ) 5 MG tablet Take 1 tablet (5 mg total) by mouth every evening. 90 tablet 3   levonorgestrel  (MIRENA , 52 MG,) 20 MCG/DAY IUD 1 Intra Uterine Device by Intrauterine route once.     meloxicam  (MOBIC ) 15 MG tablet Take 1 tablet (15 mg total) by mouth daily. 30 tablet 0   Multiple Vitamin (MULTIVITAMIN) tablet Take 1 tablet by mouth daily.     pravastatin  (PRAVACHOL ) 40 MG tablet Take 1 tablet (40 mg total) by mouth daily. 90 tablet 1   Probiotic Product (DIGESTIVE ADV DIGESTIVE/IMMUNE PO) Take 1 Units by mouth daily.     tamoxifen  (NOLVADEX ) 20 MG tablet Take 1 tablet (20 mg total) by mouth daily. 90 tablet 3   tirzepatide  (MOUNJARO ) 15 MG/0.5ML Pen Inject 15 mg into the skin once a week. 6 mL 1   No current  facility-administered medications for this visit.    PHYSICAL EXAMINATION: ECOG PERFORMANCE STATUS: 1 - Symptomatic but completely ambulatory  Vitals:   08/19/23 1518  BP: 129/68  Pulse: 91  Resp: 16  Temp: 98.9 F (37.2 C)  SpO2: 97%   Filed Weights   08/19/23 1518  Weight: 191 lb 14.4 oz (87 kg)   LABORATORY DATA:  I have reviewed the data as listed    Latest Ref Rng & Units 08/09/2023    4:24 PM 02/08/2023    3:20 PM 09/23/2022   12:00 AM  CMP  Glucose 70 - 99 mg/dL 99     BUN 6 - 20 mg/dL 13     Creatinine 9.52 - 1.00 mg/dL 8.41  3.24    Sodium 401 - 145 mmol/L 140     Potassium 3.5 - 5.1 mmol/L 3.0   3.5   Chloride 98 - 111 mmol/L 103   100   CO2 22 - 32 mmol/L 28   30   Calcium 8.9 - 10.3 mg/dL 8.7   9.4   Total Protein 6.5 - 8.1 g/dL 7.3     Total Bilirubin 0.0 - 1.2 mg/dL 0.3     Alkaline Phos 38 - 126 U/L 61  58   AST 15 - 41 U/L 23   20   ALT 0 - 44 U/L 17   16     Lab Results  Component Value Date   WBC 9.0 08/09/2023   HGB 12.6 08/09/2023   HCT 36.7 08/09/2023   MCV 84.0 08/09/2023   PLT 318 08/09/2023   NEUTROABS 5.3 08/09/2023    ASSESSMENT & PLAN:  Malignant neoplasm of upper-outer quadrant of left breast in female, estrogen receptor positive (HCC) 07/28/2021: Left lumpectomy: Grade 2 ILC 3.5 cm with LCIS, intraductal papilloma with UBH, margins negative, 1 lymph node with isolated tumor cells, ER 90%, PR 80%, HER2 negative, Ki-67 4% Oncotype score 14: Distant recurrence at 9 years: 4% 09/11/2021-10/08/2021 Adjuvant radiation:    Current treatment: adjuvant antiestrogen therapy with tamoxifen  20 mg daily x10 years (she will start at 10 mg a day for the first month and then increase it to 20 if she tolerates it well), plan to switch to aromatase inhibitor once she is in menopause    Tamoxifen  toxicities: Mild hot flashes.   Weight: Patient goes to the healthy weight and wellness clinic and has lost 45 pounds with Monjuro. Patient works as a Engineer, civil (consulting)  in the [employer].   Breast cancer surveillance: Mammogram 05/22/2022: Benign breast density category B Breast exam 06/25/2022: Benign CT CAP 02/11/2023: Skin thickening left breast nonspecific, no evidence of metastatic disease Bone scan 10/14/2022: No evidence of metastatic disease Breast MRI 11/26/2022: 2 areas of concern likely fat necrosis, biopsy 12/02/2022: Benign fat necrosis Signatera: Previously was negative, slight positivity in July 2024: Scans negative, repeat CT chest abdomen pelvis in 6 months, 01/28/2023:  Positive 0.19. Patient is still premenopausal: June 2024 estradiol  6.8, FSH 25.  She gets every 6 months menopause labs.  If the [facility] crosses 35 we can switch her from tamoxifen  to anastrozole.   CT CAP: 08/09/23: unchanged 0.2 cm nodule RUL non specific, hep steatosis We will alternate mammograms and breast MRIs.    Return to clinic in 6 months for CT CAP and follow-up ------------------------------------- Assessment and Plan Assessment & Plan Kidney stones Imaging confirmed a kidney stone without additional stones or pathologies.  Hypokalemia Intermittent hypokalemia with unclear cause, not causing significant issues. - Discuss with primary care physician about potassium supplementation. - Monitor potassium levels periodically.  Fatty liver CT scan shows mild fatty liver without symptoms or complications.  Menopausal status Labs show active ovaries with normal estradiol ; FSH not indicative of menopause. - Reassess menopausal status in six months. - Discuss hormone levels with primary care physician if needed.      No orders of the defined types were placed in this encounter.  The patient has a good understanding of the overall plan. she agrees with it. she will call with any problems that may develop before the next visit here. Total time spent: 30 mins including face to face time and time spent for planning, charting and co-ordination of care   Margert Sheerer,  MD 08/19/23

## 2023-08-24 DIAGNOSIS — L820 Inflamed seborrheic keratosis: Secondary | ICD-10-CM | POA: Diagnosis not present

## 2023-08-31 DIAGNOSIS — F439 Reaction to severe stress, unspecified: Secondary | ICD-10-CM | POA: Diagnosis not present

## 2023-08-31 DIAGNOSIS — Z6832 Body mass index (BMI) 32.0-32.9, adult: Secondary | ICD-10-CM | POA: Diagnosis not present

## 2023-08-31 DIAGNOSIS — E66811 Obesity, class 1: Secondary | ICD-10-CM | POA: Diagnosis not present

## 2023-08-31 DIAGNOSIS — E559 Vitamin D deficiency, unspecified: Secondary | ICD-10-CM | POA: Diagnosis not present

## 2023-08-31 DIAGNOSIS — E1169 Type 2 diabetes mellitus with other specified complication: Secondary | ICD-10-CM | POA: Diagnosis not present

## 2023-08-31 DIAGNOSIS — E7849 Other hyperlipidemia: Secondary | ICD-10-CM | POA: Diagnosis not present

## 2023-10-04 ENCOUNTER — Other Ambulatory Visit (HOSPITAL_COMMUNITY): Payer: Self-pay

## 2023-10-04 DIAGNOSIS — D72829 Elevated white blood cell count, unspecified: Secondary | ICD-10-CM | POA: Diagnosis not present

## 2023-10-04 DIAGNOSIS — L501 Idiopathic urticaria: Secondary | ICD-10-CM | POA: Diagnosis not present

## 2023-10-04 DIAGNOSIS — D473 Essential (hemorrhagic) thrombocythemia: Secondary | ICD-10-CM | POA: Diagnosis not present

## 2023-10-04 DIAGNOSIS — C50919 Malignant neoplasm of unspecified site of unspecified female breast: Secondary | ICD-10-CM | POA: Diagnosis not present

## 2023-10-04 DIAGNOSIS — K901 Tropical sprue: Secondary | ICD-10-CM | POA: Diagnosis not present

## 2023-10-04 DIAGNOSIS — E1169 Type 2 diabetes mellitus with other specified complication: Secondary | ICD-10-CM | POA: Diagnosis not present

## 2023-10-04 DIAGNOSIS — E559 Vitamin D deficiency, unspecified: Secondary | ICD-10-CM | POA: Diagnosis not present

## 2023-10-04 DIAGNOSIS — E669 Obesity, unspecified: Secondary | ICD-10-CM | POA: Diagnosis not present

## 2023-10-04 DIAGNOSIS — Z0000 Encounter for general adult medical examination without abnormal findings: Secondary | ICD-10-CM | POA: Diagnosis not present

## 2023-10-04 DIAGNOSIS — E7800 Pure hypercholesterolemia, unspecified: Secondary | ICD-10-CM | POA: Diagnosis not present

## 2023-10-04 MED ORDER — POTASSIUM CHLORIDE ER 20 MEQ PO TBCR
20.0000 meq | EXTENDED_RELEASE_TABLET | Freq: Every day | ORAL | 0 refills | Status: AC
  Filled 2023-10-04: qty 30, 30d supply, fill #0

## 2023-10-11 ENCOUNTER — Encounter: Payer: Self-pay | Admitting: Hematology and Oncology

## 2023-10-20 DIAGNOSIS — E876 Hypokalemia: Secondary | ICD-10-CM | POA: Diagnosis not present

## 2023-10-21 ENCOUNTER — Other Ambulatory Visit (HOSPITAL_COMMUNITY): Payer: Self-pay

## 2023-10-21 MED ORDER — POKONZA 10 MEQ PO PACK
10.0000 meq | PACK | Freq: Every day | ORAL | 3 refills | Status: AC
  Filled 2023-10-21: qty 30, 30d supply, fill #0

## 2023-10-25 ENCOUNTER — Other Ambulatory Visit: Payer: Self-pay

## 2023-10-25 ENCOUNTER — Other Ambulatory Visit (HOSPITAL_COMMUNITY): Payer: Self-pay

## 2023-10-25 ENCOUNTER — Other Ambulatory Visit: Payer: Self-pay | Admitting: Hematology and Oncology

## 2023-10-25 MED ORDER — TAMOXIFEN CITRATE 20 MG PO TABS
20.0000 mg | ORAL_TABLET | Freq: Every day | ORAL | 3 refills | Status: AC
  Filled 2023-10-25 – 2024-02-01 (×2): qty 90, 90d supply, fill #0

## 2023-10-25 MED ORDER — POTASSIUM CHLORIDE ER 10 MEQ PO TBCR
10.0000 meq | EXTENDED_RELEASE_TABLET | Freq: Every day | ORAL | 1 refills | Status: AC
  Filled 2023-10-25 – 2024-02-01 (×2): qty 90, 90d supply, fill #0

## 2023-10-25 MED ORDER — HYDROCHLOROTHIAZIDE 25 MG PO TABS
25.0000 mg | ORAL_TABLET | Freq: Every morning | ORAL | 1 refills | Status: AC
  Filled 2023-10-25 – 2024-02-01 (×2): qty 90, 90d supply, fill #0

## 2023-10-25 NOTE — Telephone Encounter (Signed)
 Per last OV, refill is appropriate. Andrea CHRISTELLA Plunk, RN

## 2023-10-26 ENCOUNTER — Other Ambulatory Visit (HOSPITAL_COMMUNITY): Payer: Self-pay

## 2023-10-26 ENCOUNTER — Other Ambulatory Visit: Payer: Self-pay

## 2023-10-26 LAB — SIGNATERA
SIGNATERA MTM READOUT: 0.13 MTM/ml — AB
SIGNATERA TEST RESULT: POSITIVE — AB

## 2023-11-01 ENCOUNTER — Telehealth: Payer: Self-pay

## 2023-11-01 ENCOUNTER — Other Ambulatory Visit: Payer: Self-pay

## 2023-11-01 DIAGNOSIS — Z170 Estrogen receptor positive status [ER+]: Secondary | ICD-10-CM

## 2023-11-01 NOTE — Telephone Encounter (Signed)
 Called pt and LVM advising to call us  back. Her signatera test came back positive at 0.13. Since her results have been inconsistent, Dr Odean would like to continue having her be tested every 3 months and we will repeat CT CAP as planned in November, adding a NM Bone Scan per verbal order.

## 2023-11-01 NOTE — Progress Notes (Signed)
 Orders placed for NM bone scan per MD verbal order.

## 2023-11-01 NOTE — Telephone Encounter (Signed)
 Pt returned call and is agreeable to previously documented plan. She understands a NM bone scan is ordered and the goal is to have this the same day as CT CAP.

## 2023-11-05 ENCOUNTER — Other Ambulatory Visit: Payer: Self-pay | Admitting: *Deleted

## 2023-11-05 DIAGNOSIS — Z170 Estrogen receptor positive status [ER+]: Secondary | ICD-10-CM

## 2023-11-08 ENCOUNTER — Telehealth: Payer: Self-pay

## 2023-11-08 NOTE — Telephone Encounter (Signed)
 Patient called regarding questions about Signatera testing and bone scan. Patient aware that Signatera testing will be conducted every 3 months. Patient also scheduled for bone scan at patient request to be done on the same day as CT scan on 11/28. Patient provided with updated appointment date and times, however patient states she can't get off of work at that time. Patient provided with Central Scheduling phone number to schedule scan at more convenient time.  Patient verbalized an understanding of the information.

## 2023-11-10 ENCOUNTER — Encounter: Payer: Self-pay | Admitting: Hematology and Oncology

## 2023-11-16 ENCOUNTER — Telehealth: Payer: Self-pay | Admitting: Physical Therapy

## 2023-11-16 NOTE — Telephone Encounter (Signed)
 Pt requested through a mutual friend/coworker that I call her to discuss DME coverage. Phoned pt and she reported that her insurance company would not cover the bras we recommended s/p breast cancer and she has a bill for $1000. She stated they are covered under Tier 3 benefits instead of Tier 1 from [employer] [facility] [place] because that location is in their Tier 3 network - even though it is the only known company in the area that provides this service. Per her request, I contacted [employer] and explained the situation. After much discussion, they agreed to cover this DME on an ongoing basis from [employer] [place] at their Tier 1 level. Phoned pt to let her know. Phoned [facility] and left a message requesting they refile the claim. Reference # for calling [employer] is [id number]. Eward Wonda Sharps, [place] 11/16/23 2:14 PM

## 2023-11-25 ENCOUNTER — Encounter: Payer: Self-pay | Admitting: Hematology and Oncology

## 2023-11-29 ENCOUNTER — Other Ambulatory Visit: Payer: Self-pay | Admitting: Hematology and Oncology

## 2023-11-29 ENCOUNTER — Ambulatory Visit
Admission: RE | Admit: 2023-11-29 | Discharge: 2023-11-29 | Disposition: A | Discharge status: Home / Self Care | Source: Ambulatory Visit | Attending: Hematology and Oncology

## 2023-11-29 ENCOUNTER — Ambulatory Visit
Admission: RE | Admit: 2023-11-29 | Discharge: 2023-11-29 | Disposition: A | Discharge status: Home / Self Care | Payer: 59 | Source: Ambulatory Visit | Attending: Hematology and Oncology

## 2023-11-29 DIAGNOSIS — R928 Other abnormal and inconclusive findings on diagnostic imaging of breast: Secondary | ICD-10-CM | POA: Diagnosis not present

## 2023-11-29 DIAGNOSIS — C50412 Malignant neoplasm of upper-outer quadrant of left female breast: Secondary | ICD-10-CM

## 2023-11-29 DIAGNOSIS — N6489 Other specified disorders of breast: Secondary | ICD-10-CM | POA: Diagnosis not present

## 2023-11-29 HISTORY — DX: Personal history of irradiation: Z92.3

## 2023-12-13 DIAGNOSIS — Z170 Estrogen receptor positive status [ER+]: Secondary | ICD-10-CM | POA: Diagnosis not present

## 2023-12-13 DIAGNOSIS — C50412 Malignant neoplasm of upper-outer quadrant of left female breast: Secondary | ICD-10-CM | POA: Diagnosis not present

## 2023-12-15 DIAGNOSIS — F439 Reaction to severe stress, unspecified: Secondary | ICD-10-CM | POA: Diagnosis not present

## 2023-12-15 DIAGNOSIS — E7849 Other hyperlipidemia: Secondary | ICD-10-CM | POA: Diagnosis not present

## 2023-12-15 DIAGNOSIS — E66811 Obesity, class 1: Secondary | ICD-10-CM | POA: Diagnosis not present

## 2023-12-15 DIAGNOSIS — Z6832 Body mass index (BMI) 32.0-32.9, adult: Secondary | ICD-10-CM | POA: Diagnosis not present

## 2023-12-15 DIAGNOSIS — E559 Vitamin D deficiency, unspecified: Secondary | ICD-10-CM | POA: Diagnosis not present

## 2023-12-15 DIAGNOSIS — E1169 Type 2 diabetes mellitus with other specified complication: Secondary | ICD-10-CM | POA: Diagnosis not present

## 2023-12-17 ENCOUNTER — Other Ambulatory Visit (HOSPITAL_COMMUNITY): Payer: Self-pay

## 2024-01-07 ENCOUNTER — Other Ambulatory Visit (HOSPITAL_COMMUNITY): Payer: Self-pay

## 2024-01-10 ENCOUNTER — Other Ambulatory Visit (HOSPITAL_COMMUNITY): Payer: Self-pay

## 2024-01-10 ENCOUNTER — Encounter (HOSPITAL_COMMUNITY): Payer: Self-pay

## 2024-01-10 DIAGNOSIS — Z170 Estrogen receptor positive status [ER+]: Secondary | ICD-10-CM | POA: Diagnosis not present

## 2024-01-10 DIAGNOSIS — C50412 Malignant neoplasm of upper-outer quadrant of left female breast: Secondary | ICD-10-CM | POA: Diagnosis not present

## 2024-01-10 MED ORDER — CIPROFLOXACIN HCL 500 MG PO TABS
500.0000 mg | ORAL_TABLET | Freq: Two times a day (BID) | ORAL | 0 refills | Status: AC | PRN
  Filled 2024-01-10: qty 30, 15d supply, fill #0

## 2024-01-12 ENCOUNTER — Other Ambulatory Visit (HOSPITAL_COMMUNITY): Payer: Self-pay

## 2024-01-13 ENCOUNTER — Other Ambulatory Visit (HOSPITAL_COMMUNITY): Payer: Self-pay

## 2024-01-20 ENCOUNTER — Other Ambulatory Visit (HOSPITAL_COMMUNITY): Payer: Self-pay

## 2024-01-21 ENCOUNTER — Other Ambulatory Visit (HOSPITAL_COMMUNITY): Payer: Self-pay

## 2024-01-22 ENCOUNTER — Telehealth: Admitting: Emergency Medicine

## 2024-01-22 DIAGNOSIS — J340 Abscess, furuncle and carbuncle of nose: Secondary | ICD-10-CM

## 2024-01-22 MED ORDER — CEPHALEXIN 500 MG PO CAPS: 500.0000 mg | ORAL_CAPSULE | Freq: Four times a day (QID) | ORAL | 0 refills | Status: AC

## 2024-01-22 NOTE — Progress Notes (Signed)

## 2024-01-25 ENCOUNTER — Other Ambulatory Visit (HOSPITAL_COMMUNITY): Payer: Self-pay

## 2024-01-28 ENCOUNTER — Telehealth: Payer: Self-pay | Admitting: Hematology and Oncology

## 2024-02-01 ENCOUNTER — Other Ambulatory Visit (HOSPITAL_COMMUNITY): Payer: Self-pay

## 2024-02-01 ENCOUNTER — Other Ambulatory Visit: Payer: Self-pay

## 2024-02-01 ENCOUNTER — Telehealth: Payer: Self-pay | Admitting: Hematology and Oncology

## 2024-02-01 DIAGNOSIS — E559 Vitamin D deficiency, unspecified: Secondary | ICD-10-CM | POA: Diagnosis not present

## 2024-02-01 DIAGNOSIS — E7849 Other hyperlipidemia: Secondary | ICD-10-CM | POA: Diagnosis not present

## 2024-02-01 DIAGNOSIS — Z6832 Body mass index (BMI) 32.0-32.9, adult: Secondary | ICD-10-CM | POA: Diagnosis not present

## 2024-02-01 DIAGNOSIS — Z79899 Other long term (current) drug therapy: Secondary | ICD-10-CM | POA: Diagnosis not present

## 2024-02-01 DIAGNOSIS — E876 Hypokalemia: Secondary | ICD-10-CM | POA: Diagnosis not present

## 2024-02-01 DIAGNOSIS — E1169 Type 2 diabetes mellitus with other specified complication: Secondary | ICD-10-CM | POA: Diagnosis not present

## 2024-02-01 DIAGNOSIS — F439 Reaction to severe stress, unspecified: Secondary | ICD-10-CM | POA: Diagnosis not present

## 2024-02-01 DIAGNOSIS — E66811 Obesity, class 1: Secondary | ICD-10-CM | POA: Diagnosis not present

## 2024-02-01 MED ORDER — MOUNJARO 15 MG/0.5ML ~~LOC~~ SOAJ
15.0000 mg | SUBCUTANEOUS | 1 refills | Status: AC
  Filled 2024-02-01: qty 6, 84d supply, fill #0

## 2024-02-02 ENCOUNTER — Encounter: Payer: Self-pay | Admitting: Hematology and Oncology

## 2024-02-02 ENCOUNTER — Other Ambulatory Visit (HOSPITAL_COMMUNITY): Payer: Self-pay

## 2024-02-02 MED ORDER — PRAVASTATIN SODIUM 40 MG PO TABS
40.0000 mg | ORAL_TABLET | Freq: Every day | ORAL | 2 refills | Status: AC
  Filled 2024-02-02: qty 90, 90d supply, fill #0

## 2024-02-18 ENCOUNTER — Inpatient Hospital Stay

## 2024-02-18 ENCOUNTER — Other Ambulatory Visit (HOSPITAL_COMMUNITY)

## 2024-02-18 ENCOUNTER — Other Ambulatory Visit

## 2024-02-24 ENCOUNTER — Ambulatory Visit: Admitting: Hematology and Oncology

## 2024-03-01 ENCOUNTER — Encounter (HOSPITAL_COMMUNITY): Payer: Self-pay

## 2024-03-01 ENCOUNTER — Encounter (HOSPITAL_COMMUNITY): Admission: RE | Admit: 2024-03-01 | Discharge: 2024-03-01 | Attending: Hematology and Oncology

## 2024-03-01 ENCOUNTER — Ambulatory Visit (HOSPITAL_COMMUNITY)
Admission: RE | Admit: 2024-03-01 | Discharge: 2024-03-01 | Disposition: A | Discharge status: Home / Self Care | Source: Ambulatory Visit | Attending: Hematology and Oncology | Admitting: Hematology and Oncology

## 2024-03-01 ENCOUNTER — Inpatient Hospital Stay: Attending: Hematology and Oncology

## 2024-03-01 DIAGNOSIS — C7981 Secondary malignant neoplasm of breast: Secondary | ICD-10-CM | POA: Diagnosis not present

## 2024-03-01 DIAGNOSIS — C50412 Malignant neoplasm of upper-outer quadrant of left female breast: Secondary | ICD-10-CM | POA: Insufficient documentation

## 2024-03-01 DIAGNOSIS — Z79899 Other long term (current) drug therapy: Secondary | ICD-10-CM | POA: Diagnosis not present

## 2024-03-01 DIAGNOSIS — Z170 Estrogen receptor positive status [ER+]: Secondary | ICD-10-CM | POA: Insufficient documentation

## 2024-03-01 DIAGNOSIS — Z79810 Long term (current) use of selective estrogen receptor modulators (SERMs): Secondary | ICD-10-CM | POA: Diagnosis not present

## 2024-03-01 DIAGNOSIS — E876 Hypokalemia: Secondary | ICD-10-CM | POA: Diagnosis not present

## 2024-03-01 DIAGNOSIS — Z1721 Progesterone receptor positive status: Secondary | ICD-10-CM | POA: Insufficient documentation

## 2024-03-01 DIAGNOSIS — Z1732 Human epidermal growth factor receptor 2 negative status: Secondary | ICD-10-CM | POA: Insufficient documentation

## 2024-03-01 DIAGNOSIS — C50919 Malignant neoplasm of unspecified site of unspecified female breast: Secondary | ICD-10-CM | POA: Diagnosis not present

## 2024-03-01 DIAGNOSIS — K760 Fatty (change of) liver, not elsewhere classified: Secondary | ICD-10-CM | POA: Diagnosis not present

## 2024-03-01 DIAGNOSIS — N200 Calculus of kidney: Secondary | ICD-10-CM | POA: Diagnosis not present

## 2024-03-01 DIAGNOSIS — Z9049 Acquired absence of other specified parts of digestive tract: Secondary | ICD-10-CM | POA: Diagnosis not present

## 2024-03-01 DIAGNOSIS — Z885 Allergy status to narcotic agent status: Secondary | ICD-10-CM | POA: Diagnosis not present

## 2024-03-01 LAB — CMP (CANCER CENTER ONLY)
ALT: 22 U/L (ref 0–44)
AST: 27 U/L (ref 15–41)
Albumin: 4.5 g/dL (ref 3.5–5.0)
Alkaline Phosphatase: 72 U/L (ref 38–126)
Anion gap: 12 (ref 5–15)
BUN: 15 mg/dL (ref 6–20)
CO2: 29 mmol/L (ref 22–32)
Calcium: 9.8 mg/dL (ref 8.9–10.3)
Chloride: 99 mmol/L (ref 98–111)
Creatinine: 0.76 mg/dL (ref 0.44–1.00)
GFR, Estimated: >60 mL/min (ref >60)
Glucose, Bld: 88 mg/dL (ref 70–99)
Potassium: 3.4 mmol/L — ABNORMAL LOW (ref 3.5–5.1)
Sodium: 140 mmol/L (ref 135–145)
Total Bilirubin: 0.4 mg/dL (ref 0.0–1.2)
Total Protein: 7.8 g/dL (ref 6.5–8.1)

## 2024-03-01 LAB — CBC WITH DIFFERENTIAL (CANCER CENTER ONLY)
Abs Immature Granulocytes: 0.01 K/uL (ref 0.00–0.07)
Basophils Absolute: 0.1 K/uL (ref 0.0–0.1)
Basophils Relative: 1 %
Eosinophils Absolute: 0.1 K/uL (ref 0.0–0.5)
Eosinophils Relative: 1 %
HCT: 41.4 % (ref 36.0–46.0)
Hemoglobin: 13.6 g/dL (ref 12.0–15.0)
Immature Granulocytes: 0 %
Lymphocytes Relative: 30 %
Lymphs Abs: 2.7 K/uL (ref 0.7–4.0)
MCH: 26.9 pg (ref 26.0–34.0)
MCHC: 32.9 g/dL (ref 30.0–36.0)
MCV: 82 fL (ref 80.0–100.0)
Monocytes Absolute: 0.7 K/uL (ref 0.1–1.0)
Monocytes Relative: 8 %
Neutro Abs: 5.5 K/uL (ref 1.7–7.7)
Neutrophils Relative %: 60 %
Platelet Count: UNDETERMINED K/uL (ref 150–400)
RBC: 5.05 MIL/uL (ref 3.87–5.11)
RDW: 16.1 % — ABNORMAL HIGH (ref 11.5–15.5)
WBC Count: 9.1 K/uL (ref 4.0–10.5)
nRBC: 0 % (ref 0.0–0.2)

## 2024-03-01 MED ORDER — IOHEXOL 300 MG/ML  SOLN
100.0000 mL | Freq: Once | INTRAMUSCULAR | Status: AC | PRN
  Administered 2024-03-01: 100 mL via INTRAVENOUS

## 2024-03-01 MED ORDER — TECHNETIUM TC 99M MEDRONATE IV KIT
20.4000 | PACK | Freq: Once | INTRAVENOUS | Status: AC
  Administered 2024-03-01: 20.4 via INTRAVENOUS

## 2024-03-01 MED ORDER — SODIUM CHLORIDE (PF) 0.9 % IJ SOLN
INTRAMUSCULAR | Status: AC
  Filled 2024-03-01: qty 50

## 2024-03-02 LAB — FOLLICLE STIMULATING HORMONE: FSH: 25.9 m[IU]/mL

## 2024-03-05 LAB — ESTRADIOL, ULTRA SENS: Estradiol, Sensitive: 7.4 pg/mL

## 2024-03-06 ENCOUNTER — Encounter: Payer: Self-pay | Admitting: Hematology and Oncology

## 2024-03-08 ENCOUNTER — Inpatient Hospital Stay: Admitting: Hematology and Oncology

## 2024-03-08 VITALS — BP 124/78 | HR 92 | Temp 98.6°F | Resp 18 | Ht 64.0 in | Wt 191.7 lb

## 2024-03-08 DIAGNOSIS — Z170 Estrogen receptor positive status [ER+]: Secondary | ICD-10-CM | POA: Diagnosis not present

## 2024-03-08 DIAGNOSIS — C50412 Malignant neoplasm of upper-outer quadrant of left female breast: Secondary | ICD-10-CM | POA: Diagnosis not present

## 2024-03-08 DIAGNOSIS — L989 Disorder of the skin and subcutaneous tissue, unspecified: Secondary | ICD-10-CM

## 2024-03-08 NOTE — Progress Notes (Signed)
 Patient Care Team: Loreli Kins, MD as PCP - General (Family Medicine) Bensimhon, Toribio SAUNDERS, MD as Consulting Physician (Cardiology) Cheryn Nickels, MD as Referring Physician (Allergy and Immunology) Odean Potts, MD as Consulting Physician (Hematology and Oncology) Izell Domino, MD as Attending Physician (Radiation Oncology) Ebbie Cough, MD as Consulting Physician (General Surgery)  DIAGNOSIS:  Encounter Diagnoses  Name Primary?   Malignant neoplasm of upper-outer quadrant of left breast in female, estrogen receptor positive (HCC) Yes   Lesion of skin of cheek     SUMMARY OF ONCOLOGIC HISTORY: Oncology History  Malignant neoplasm of upper-outer quadrant of left breast in female, estrogen receptor positive (HCC)  06/27/2021 Initial Diagnosis   Screening mammogram detected left breast asymmetry and distortion.  No ultrasound correlate.  Axilla negative, stereotactic biopsy showed grade 1 invasive lobular cancer with LCIS, negative for lymphovascular invasion, ER 90%, PR 80%, Ki-67 4%, HER2 equivocal by IHC, FISH negative, ratio 1.21   07/10/2021 Cancer Staging   Staging form: Breast, AJCC 8th Edition - Clinical: Stage Unknown (cTX, cN0, cM0, G1, ER+, PR+, HER2-) - Signed by Odean Potts, MD on 07/10/2021 Stage prefix: Initial diagnosis Histologic grading system: 3 grade system    Genetic Testing   Negative genetic testing. No pathogenic variants identified on the [facility] BRCAP[employer]+ CancerNext-Expanded+RNA panel. VUS in KIF1B called [id number] identified. The report date is 07/25/2021.  The CancerNext-Expanded + RNAinsight gene panel offered by [employer] and includes sequencing and rearrangement analysis for the following 77 genes: IP, ALK, APC*, ATM*, AXIN2, BAP1, BARD1, BLM, BMPR1A, BRCA1*, BRCA2*, BRIP1*, CDC73, CDH1*,CDK4, CDKN1B, CDKN2A, CHEK2*, CTNNA1, DICER1, FANCC, FH, FLCN, GALNT12, KIF1B, LZTR1, MAX, MEN1, MET, MLH1*, MSH2*, MSH3, MSH6*, MUTYH*, NBN, NF1*, NF2, NTHL1,  PALB2*, PHOX2B, PMS2*, POT1, PRKAR1A, PTCH1, PTEN*, RAD51C*, RAD51D*,RB1, RECQL, RET, SDHA, SDHAF2, SDHB, SDHC, SDHD, SMAD4, SMARCA4, SMARCB1, SMARCE1, STK11, SUFU, TMEM127, TP53*,TSC1, TSC2, VHL and XRCC2 (sequencing and deletion/duplication); EGFR, EGLN1, HOXB13, KIT, MITF, PDGFRA, POLD1 and POLE (sequencing only); EPCAM and GREM1 (deletion/duplication only).   07/28/2021 Surgery   07/28/2021: Left lumpectomy: Grade 2 ILC 3.5 cm with LCIS, intraductal papilloma with UBH, margins negative, 1 lymph node with isolated tumor cells, ER 90%, PR 80%, HER2 negative, Ki-67 4%   08/05/2021 Cancer Staging   Staging form: Breast, AJCC 8th Edition - Pathologic: Stage IA (pT2, pN0(i+), cM0, G2, ER+, PR+, HER2-) - Signed by Odean Potts, MD on 08/05/2021 Multigene prognostic tests performed: Oncotype DX Histologic grading system: 3 grade system   08/12/2021 Oncotype testing   Oncotype score 14: Distant recurrence at 9 years: 4%   09/11/2021 - 10/08/2021 Radiation Therapy   Site Technique Total Dose (Gy) Dose per Fx (Gy) Completed Fx Beam Energies  Breast, Left: Breast_L_Axilla 3D 40.05/40.05 2.67 15/15 10X  Breast, Left: Breast_L_Bst 3D 10/10 2 5/5 6X, 10X     10/07/2021 -  Anti-estrogen oral therapy   20 MG Tamoxifen  x 10 years     CHIEF COMPLIANT: Follow-up on tamoxifen   HISTORY OF PRESENT ILLNESS:   History of Present Illness Elizabeth Cummings is a 48 year old female with estrogen receptor positive invasive lobular carcinoma of the left breast, status post lumpectomy, adjuvant radiation, and ongoing tamoxifen  therapy, who presents for routine oncology follow-up and evaluation of a new left cheek nodule.  She is on adjuvant tamoxifen  with mild hot flashes. Recent labs show FSH 25.9 in the postmenopausal range and estradiol  in the menopausal range. She prefers to maintain the current breast imaging schedule due to concerns about prior imaging sensitivity and  breast density. Two laboratory results remain  positive, and she is aware.  She notes a persistent 5-6 mm subcutaneous nodule in the left cheek that began with facial swelling thought to be a clogged salivary gland, which resolved with conservative care. The nodule remains, is not seen on dental CT, and is intermittently noticeable but not painful or consistently bothersome. Dental evaluation showed no dental pathology. She is concerned about malignancy risk and requests further evaluation.  A 0.2 cm right upper lobe pulmonary nodule and a kidney stone are longstanding and stable, with no new pulmonary or genitourinary symptoms.  She takes potassium supplementation for persistent hypokalemia, with recent levels improved but still borderline low.       ALLERGIES:  is allergic to gluten meal, wound dressing adhesive, and dilaudid [hydromorphone hcl].  MEDICATIONS:  Current Outpatient Medications  Medication Sig Dispense Refill   azelastine  (ASTELIN ) 0.1 % nasal spray Place 1-2 sprays into both nostrils 2 (two) times daily. 30 mL 5   Blood Glucose Monitoring Suppl (ACCU-CHEK GUIDE) w/Device KIT Use as directed to test blood sugar once daily. 1 kit 1   calcium-vitamin D  (OSCAL WITH D) 500-200 MG-UNIT per tablet Take 1 tablet by mouth daily.     Cholecalciferol (VITAMIN D ) 50 MCG (2000 UT) CAPS Take 1 capsule (2,000 Units total) by mouth daily. 30 capsule 0   ciprofloxacin  (CIPRO ) 500 MG tablet Take 1 tablet (500 mg total) by mouth every 12 (twelve) hours. 30 tablet 0   ciprofloxacin  (CIPRO ) 500 MG tablet Take 1 tablet (500 mg total) by mouth every 12 (twelve) hours as needed 30 tablet 0   [employer] FIBER GUMMIES PO Take 2 Units by mouth daily.     fexofenadine  (ALLEGRA ) 180 MG tablet Take 1 tablet Orally Once a day in the morning. (please fill with levocetirizine) 90 tablet 3   glucose blood (ACCU-CHEK GUIDE) test strip Use one strip daily to test blood sugar once a day. 100 each 3   hydrochlorothiazide  (HYDRODIURIL ) 25 MG tablet Take 1 tablet (25  mg total) by mouth every morning. 90 tablet 1   Accu-Chek Softclix Lancets lancets Use one lancet daily as directed to test blood sugar. 100 each 3   levocetirizine (XYZAL ) 5 MG tablet Take 1 tablet (5 mg total) by mouth every evening. 90 tablet 3   levonorgestrel  (MIRENA , 52 MG,) 20 MCG/DAY IUD 1 Intra Uterine Device by Intrauterine route once.     meloxicam  (MOBIC ) 15 MG tablet Take 1 tablet (15 mg total) by mouth daily. 30 tablet 0   Multiple Vitamin (MULTIVITAMIN) tablet Take 1 tablet by mouth daily.     potassium chloride  (KLOR-CON ) 10 MEQ tablet Take 1 tablet (10 mEq total) by mouth daily with food. 90 tablet 1   Potassium Chloride  (POKONZA ) 10 MEQ PACK Take 10 mEq by mouth daily with food. 30 each 3   Potassium Chloride  ER 20 MEQ TBCR Take 1 tablet (20 mEq total) by mouth daily for 1 week, with food. 30 tablet 0   pravastatin  (PRAVACHOL ) 40 MG tablet Take 1 tablet (40 mg total) by mouth daily. 90 tablet 1   pravastatin  (PRAVACHOL ) 40 MG tablet Take 1 tablet (40 mg total) by mouth daily. 90 tablet 2   Probiotic Product (DIGESTIVE ADV DIGESTIVE/IMMUNE PO) Take 1 Units by mouth daily.     tamoxifen  (NOLVADEX ) 20 MG tablet Take 1 tablet (20 mg total) by mouth daily. 90 tablet 3   tirzepatide  (MOUNJARO ) 15 MG/0.5ML Pen Inject 15 mg into  the skin once a week. 6 mL 1   tirzepatide  (MOUNJARO ) 15 MG/0.5ML Pen Inject 15 mg into the skin once a week. 6 mL 1   No current facility-administered medications for this visit.    PHYSICAL EXAMINATION: ECOG PERFORMANCE STATUS: 1 - Symptomatic but completely ambulatory  Vitals:   03/08/24 1448  BP: 124/78  Pulse: 92  Resp: 18  Temp: 98.6 F (37 C)  SpO2: 100%   Filed Weights   03/08/24 1448  Weight: 191 lb 11.2 oz (87 kg)      LABORATORY DATA:  I have reviewed the data as listed    Latest Ref Rng & Units 03/01/2024   12:41 PM 08/09/2023    4:24 PM 02/08/2023    3:20 PM  CMP  Glucose 70 - 99 mg/dL 88  99    BUN 6 - 20 mg/dL 15  13     Creatinine 9.55 - 1.00 mg/dL 9.23  9.36  9.09   Sodium 135 - 145 mmol/L 140  140    Potassium 3.5 - 5.1 mmol/L 3.4  3.0    Chloride 98 - 111 mmol/L 99  103    CO2 22 - 32 mmol/L 29  28    Calcium 8.9 - 10.3 mg/dL 9.8  8.7    Total Protein 6.5 - 8.1 g/dL 7.8  7.3    Total Bilirubin 0.0 - 1.2 mg/dL 0.4  0.3    Alkaline Phos 38 - 126 U/L 72  61    AST 15 - 41 U/L 27  23    ALT 0 - 44 U/L 22  17      Lab Results  Component Value Date   WBC 9.1 03/01/2024   HGB 13.6 03/01/2024   HCT 41.4 03/01/2024   MCV 82.0 03/01/2024   PLT PLATELET CLUMPS NOTED ON SMEAR, UNABLE TO ESTIMATE 03/01/2024   NEUTROABS 5.5 03/01/2024    ASSESSMENT & PLAN:  Malignant neoplasm of upper-outer quadrant of left breast in female, estrogen receptor positive (HCC) 07/28/2021: Left lumpectomy: Grade 2 ILC 3.5 cm with LCIS, intraductal papilloma with UBH, margins negative, 1 lymph node with isolated tumor cells, ER 90%, PR 80%, HER2 negative, Ki-67 4% Oncotype score 14: Distant recurrence at 9 years: 4% 09/11/2021-10/08/2021 Adjuvant radiation:    Current treatment: adjuvant antiestrogen therapy with tamoxifen  20 mg daily x10 years (she will start at 10 mg a day for the first month and then increase it to 20 if she tolerates it well), plan to switch to aromatase inhibitor once she is in menopause    Tamoxifen  toxicities: Mild hot flashes.   Weight: Patient goes to the healthy weight and wellness clinic and has lost 45 pounds with Monjuro. Patient works as a engineer, civil (consulting) in the [employer].   Breast cancer surveillance: Mammogram 05/22/2022: Benign breast density category B Breast exam 03/08/2024: Benign CT CAP 03/01/2024:  no evidence of metastatic disease Bone scan 03/01/2024: No evidence of metastatic disease, mild chest wall soft tissue uptake likely inflammatory Breast MRI 11/26/2022: 2 areas of concern likely fat necrosis, biopsy 12/02/2022: Benign fat necrosis Signatera: Previously was negative, slight positivity in July  2024: 10/26/2023: 0.13  Patient is still premenopausal:  June 2024 estradiol  6.8, FSH 25.   03/01/2024: Estradiol  7.4, FSH 25.9  We will alternate mammograms and breast MRIs.    Return to clinic in 6 months for CT CAP and follow-up ------------------------------------- Assessment and Plan Assessment & Plan Estrogen receptor positive left breast cancer, upper-outer quadrant Improved  mammogram accuracy due to decreased breast density. - Alternated mammogram and breast MRI for surveillance per her preference - Continued CT chest/abdomen/pelvis every 6 months - Ordered labs, including menopausal status, on day of next CT scan  Cheek subcutaneous nodule Persistent 5-6 mm subcutaneous nodule with intermittent symptoms. Not visualized on dental CT. Initial swelling resolved. Dentist suggested benign etiology, but further evaluation warranted due to oncologic history and concern. - Referred to ENT for evaluation  Pulmonary nodule 0.2 cm right upper lobe nodule.  - Continued surveillance CT chest/abdomen/pelvis every 6 months; monitored for changes in size or characteristics  Nephrolithiasis Asymptomatic nephrolithiasis not causing acute issues. Ongoing surveillance appropriate. - Continued surveillance imaging every six months      Orders Placed This Encounter  Procedures   CT CHEST ABDOMEN PELVIS W CONTRAST    Standing Status:   Future    Expected Date:   09/06/2024    Expiration Date:   03/08/2025    If indicated for the ordered procedure, I authorize the administration of contrast media per Radiology protocol:   Yes    Does the patient have a contrast media/X-ray dye allergy?:   No    Preferred imaging location?:   [facility]    Release to patient:   Immediate    If indicated for the ordered procedure, I authorize the administration of oral contrast media per Radiology protocol:   No    Reason for no oral contrast::   breast   NM Bone Scan Whole Body    Standing  Status:   Future    Expected Date:   09/06/2024    Expiration Date:   03/08/2025    If indicated for the ordered procedure, I authorize the administration of a radiopharmaceutical per Radiology protocol:   Yes    Is the patient pregnant?:   No    Preferred imaging location?:   [facility]    Release to patient:   Immediate   MR BREAST BILATERAL W WO CONTRAST INC CAD    Standing Status:   Future    Expected Date:   06/05/2024    Expiration Date:   03/08/2025    If indicated for the ordered procedure, I authorize the administration of contrast media per Radiology protocol:   Yes    What is the patient's sedation requirement?:   No Sedation    Does the patient have a pacemaker or implanted devices?:   No    Radiology Contrast Protocol - do NOT remove file path:   [email or web address]    Preferred imaging location?:   [facility] (table limit-550lbs)    Release to patient:   Immediate   CBC with Differential (Cancer Center Only)    Standing Status:   Future    Expiration Date:   03/08/2025   CMP (Cancer Center only)    Standing Status:   Future    Expiration Date:   03/08/2025   FSH-Follicle stimulating hormone    Standing Status:   Future    Expiration Date:   03/08/2025   Estradiol     Standing Status:   Future    Expiration Date:   04/12/2025   Ambulatory referral to ENT    Referral Priority:   Routine    Referral Type:   Consultation    Referral Reason:   Specialty Services Required    Referred to Provider:   Jesus Oliphant, MD    Requested Specialty:   Otolaryngology    Number  of Visits Requested:   1   The patient has a good understanding of the overall plan. she agrees with it. she will call with any problems that may develop before the next visit here.  I personally spent a total of 30 minutes in the care of the patient today including preparing to see the patient, getting/reviewing separately obtained history, performing a  medically appropriate exam/evaluation, counseling and educating, placing orders, referring and communicating with other health care professionals, documenting clinical information in the EHR, independently interpreting results, communicating results, and coordinating care.   Viinay K Shiheem Corporan, MD 03/08/2024

## 2024-03-08 NOTE — Assessment & Plan Note (Signed)
 07/28/2021: Left lumpectomy: Grade 2 ILC 3.5 cm with LCIS, intraductal papilloma with UBH, margins negative, 1 lymph node with isolated tumor cells, ER 90%, PR 80%, HER2 negative, Ki-67 4% Oncotype score 14: Distant recurrence at 9 years: 4% 09/11/2021-10/08/2021 Adjuvant radiation:    Current treatment: adjuvant antiestrogen therapy with tamoxifen  20 mg daily x10 years (she will start at 10 mg a day for the first month and then increase it to 20 if she tolerates it well), plan to switch to aromatase inhibitor once she is in menopause    Tamoxifen  toxicities: Mild hot flashes.   Weight: Patient goes to the healthy weight and wellness clinic and has lost 45 pounds with Monjuro. Patient works as a engineer, civil (consulting) in the [employer].   Breast cancer surveillance: Mammogram 05/22/2022: Benign breast density category B Breast exam 03/08/2024: Benign CT CAP 03/01/2024:  no evidence of metastatic disease Bone scan 03/01/2024: No evidence of metastatic disease, mild chest wall soft tissue uptake likely inflammatory Breast MRI 11/26/2022: 2 areas of concern likely fat necrosis, biopsy 12/02/2022: Benign fat necrosis Signatera: Previously was negative, slight positivity in July 2024: 10/26/2023: 0.13  Patient is still premenopausal:  June 2024 estradiol  6.8, FSH 25.   03/01/2024: Estradiol  7.4, FSH 25.9  We will alternate mammograms and breast MRIs.    Return to clinic in 6 months for CT CAP and follow-up

## 2024-03-09 DIAGNOSIS — M2740 Unspecified cyst of jaw: Secondary | ICD-10-CM | POA: Diagnosis not present

## 2024-03-09 DIAGNOSIS — R220 Localized swelling, mass and lump, head: Secondary | ICD-10-CM | POA: Diagnosis not present

## 2024-03-13 ENCOUNTER — Other Ambulatory Visit (HOSPITAL_COMMUNITY): Payer: Self-pay | Admitting: Otolaryngology

## 2024-03-13 DIAGNOSIS — R220 Localized swelling, mass and lump, head: Secondary | ICD-10-CM

## 2024-03-14 ENCOUNTER — Ambulatory Visit (HOSPITAL_BASED_OUTPATIENT_CLINIC_OR_DEPARTMENT_OTHER)
Admission: RE | Admit: 2024-03-14 | Discharge: 2024-03-14 | Disposition: A | Discharge status: Home / Self Care | Source: Ambulatory Visit | Attending: Otolaryngology | Admitting: Otolaryngology

## 2024-03-14 DIAGNOSIS — R220 Localized swelling, mass and lump, head: Secondary | ICD-10-CM | POA: Diagnosis not present

## 2024-03-14 MED ORDER — IOHEXOL 300 MG/ML  SOLN
100.0000 mL | Freq: Once | INTRAMUSCULAR | Status: AC | PRN
  Administered 2024-03-14: 75 mL via INTRAVENOUS

## 2024-04-05 ENCOUNTER — Other Ambulatory Visit: Payer: Self-pay

## 2024-04-05 ENCOUNTER — Other Ambulatory Visit (HOSPITAL_COMMUNITY): Payer: Self-pay

## 2024-04-05 MED ORDER — POTASSIUM CHLORIDE ER 10 MEQ PO CPCR
20.0000 meq | ORAL_CAPSULE | Freq: Every day | ORAL | 3 refills | Status: DC
  Filled 2024-04-05: qty 180, 90d supply, fill #0

## 2024-04-07 ENCOUNTER — Other Ambulatory Visit (HOSPITAL_COMMUNITY): Payer: Self-pay

## 2024-04-10 ENCOUNTER — Other Ambulatory Visit (HOSPITAL_COMMUNITY): Payer: Self-pay

## 2024-04-19 ENCOUNTER — Other Ambulatory Visit (HOSPITAL_COMMUNITY): Payer: Self-pay

## 2024-04-19 MED ORDER — MOUNJARO 15 MG/0.5ML ~~LOC~~ SOAJ
15.0000 mg | SUBCUTANEOUS | 1 refills | Status: AC
  Filled 2024-04-19: qty 6, 84d supply, fill #0

## 2024-04-26 LAB — SIGNATERA
SIGNATERA MTM READOUT: 0.14 MTM/ml — AB
SIGNATERA TEST RESULT: POSITIVE — AB

## 2024-06-07 ENCOUNTER — Other Ambulatory Visit (HOSPITAL_COMMUNITY)

## 2024-09-06 ENCOUNTER — Other Ambulatory Visit (HOSPITAL_COMMUNITY)

## 2024-09-06 ENCOUNTER — Inpatient Hospital Stay

## 2024-09-14 ENCOUNTER — Inpatient Hospital Stay: Admitting: Hematology and Oncology
